# Patient Record
Sex: Male | Born: 1940 | Race: White | Hispanic: No | Marital: Married | Smoking: Former smoker
Health system: Southern US, Community
[De-identification: ages and names within clinical notes are randomized; demographics above are authoritative.]

## PROBLEM LIST (undated history)

## (undated) DIAGNOSIS — I4891 Unspecified atrial fibrillation: Secondary | ICD-10-CM

## (undated) DIAGNOSIS — I2583 Coronary atherosclerosis due to lipid rich plaque: Secondary | ICD-10-CM

## (undated) DIAGNOSIS — I10 Essential (primary) hypertension: Secondary | ICD-10-CM

## (undated) DIAGNOSIS — Z950 Presence of cardiac pacemaker: Secondary | ICD-10-CM

## (undated) DIAGNOSIS — I447 Left bundle-branch block, unspecified: Secondary | ICD-10-CM

## (undated) DIAGNOSIS — I2510 Atherosclerotic heart disease of native coronary artery without angina pectoris: Secondary | ICD-10-CM

## (undated) DIAGNOSIS — D696 Thrombocytopenia, unspecified: Secondary | ICD-10-CM

## (undated) DIAGNOSIS — E785 Hyperlipidemia, unspecified: Secondary | ICD-10-CM

## (undated) HISTORY — DX: Coronary atherosclerosis due to lipid rich plaque: I25.83

## (undated) HISTORY — DX: Left bundle-branch block, unspecified: I44.7

## (undated) HISTORY — DX: Thrombocytopenia, unspecified: D69.6

## (undated) HISTORY — DX: Atherosclerotic heart disease of native coronary artery without angina pectoris: I25.10

## (undated) HISTORY — DX: Essential (primary) hypertension: I10

## (undated) HISTORY — DX: Hyperlipidemia, unspecified: E78.5

## (undated) HISTORY — PX: PACEMAKER GENERATOR CHANGE: SHX5998

## (undated) HISTORY — PX: CARDIAC SURGERY: SHX584

## (undated) HISTORY — PX: CORONARY ARTERY BYPASS GRAFT: SHX141

---

## 2009-04-25 ENCOUNTER — Ambulatory Visit: Payer: Self-pay | Admitting: Internal Medicine

## 2009-09-15 ENCOUNTER — Ambulatory Visit: Payer: Self-pay | Admitting: Internal Medicine

## 2010-03-19 ENCOUNTER — Ambulatory Visit: Payer: Self-pay | Admitting: Internal Medicine

## 2010-03-26 ENCOUNTER — Ambulatory Visit: Payer: Self-pay | Admitting: Internal Medicine

## 2010-07-25 ENCOUNTER — Ambulatory Visit: Payer: Medicare Other | Admitting: Internal Medicine

## 2010-07-25 LAB — BASIC METABOLIC PANEL
BUN: 23 mg/dL — AB (ref 4–21)
Creatinine: 0.9 mg/dL (ref 0.6–1.3)
Glucose: 96 mg/dL

## 2010-07-25 LAB — HEPATIC FUNCTION PANEL: Bilirubin, Total: 0.6 mg/dL

## 2010-12-03 ENCOUNTER — Ambulatory Visit: Payer: Self-pay | Admitting: Internal Medicine

## 2010-12-03 ENCOUNTER — Ambulatory Visit (INDEPENDENT_AMBULATORY_CARE_PROVIDER_SITE_OTHER): Payer: Medicare Other | Admitting: Internal Medicine

## 2010-12-03 ENCOUNTER — Encounter: Payer: Self-pay | Admitting: Internal Medicine

## 2010-12-03 VITALS — BP 138/90 | HR 86 | Temp 98.0°F | Resp 16 | Ht 73.2 in | Wt 211.0 lb

## 2010-12-03 DIAGNOSIS — I2510 Atherosclerotic heart disease of native coronary artery without angina pectoris: Secondary | ICD-10-CM | POA: Insufficient documentation

## 2010-12-03 DIAGNOSIS — E785 Hyperlipidemia, unspecified: Secondary | ICD-10-CM | POA: Insufficient documentation

## 2010-12-03 DIAGNOSIS — I10 Essential (primary) hypertension: Secondary | ICD-10-CM

## 2010-12-03 MED ORDER — FLUOCINOLONE ACETONIDE 0.01 % EX CREA: TOPICAL_CREAM | CUTANEOUS | Status: DC | PRN

## 2010-12-03 NOTE — Patient Instructions (Signed)
Continue current medications. 

## 2010-12-03 NOTE — Progress Notes (Signed)
  Subjective:    Patient ID: Blake Osborne, male    DOB: 07-15-1940, 70 y.o.   MRN: [id number]  Hypertension This is a chronic problem. The current episode started more than 1 month ago. The problem has been gradually improving since onset. The problem is controlled. Pertinent negatives include no anxiety, blurred vision, chest pain, headaches, malaise/fatigue, neck pain, palpitations, peripheral edema, shortness of breath or sweats. There are no associated agents to hypertension. Past treatments include calcium channel blockers. The current treatment provides moderate improvement. There are no compliance problems.  Hypertensive end-organ damage includes CAD/MI.      Review of Systems  Constitutional: Negative for fever, chills, malaise/fatigue, activity change, appetite change, fatigue and unexpected weight change.  HENT: Negative for neck pain.   Eyes: Negative for blurred vision and visual disturbance.  Respiratory: Negative for cough and shortness of breath.   Cardiovascular: Negative for chest pain, palpitations and leg swelling.  Gastrointestinal: Negative for abdominal pain and abdominal distention.  Genitourinary: Negative for dysuria, urgency and difficulty urinating.  Musculoskeletal: Negative for arthralgias and gait problem.  Skin: Negative for color change and rash.  Neurological: Negative for headaches.  Hematological: Negative for adenopathy.  Psychiatric/Behavioral: Negative for sleep disturbance and dysphoric mood. The patient is not nervous/anxious.        Objective:   Physical Exam  Constitutional: He is oriented to person, place, and time. He appears well-developed and well-nourished. No distress.  HENT:  Head: Normocephalic and atraumatic.  Eyes: Conjunctivae and EOM are normal. Pupils are equal, round, and reactive to light.  Neck: Normal range of motion. Neck supple.  Cardiovascular: Normal rate, regular rhythm and normal heart sounds.  Exam reveals no gallop  and no friction rub.   No murmur heard. Pulmonary/Chest: Effort normal and breath sounds normal. No respiratory distress. He has no wheezes. He has no rales. He exhibits no tenderness.  Musculoskeletal: Normal range of motion.  Neurological: He is alert and oriented to person, place, and time. No cranial nerve deficit.  Skin: Skin is warm and dry. He is not diaphoretic.  Psychiatric: He has a normal mood and affect. His behavior is normal. Judgment and thought content normal.            Assessment:  Pt doing very well after starting Amlodipine for BP.  BP<130/80. Will continue current meds. He has follow up with cardiology in December and will forward [place] any labs or changes made at that visit. Discussion: normal blood pressure Cardiovascular risk factors: advanced age (older than 64 for men, 59 for women), dyslipidemia, hypertension and male gender   Plan:  Continue current treatment regimen. Continue current medications. Dietary sodium restriction. Follow up in 6 months. Patient Education: Reviewed risks of hypertension and principles of  treatment.

## 2011-02-04 ENCOUNTER — Telehealth (INDEPENDENT_AMBULATORY_CARE_PROVIDER_SITE_OTHER): Payer: Self-pay

## 2011-02-04 NOTE — Telephone Encounter (Signed)
error 

## 2011-04-05 ENCOUNTER — Telehealth: Payer: Self-pay | Admitting: *Deleted

## 2011-04-05 NOTE — Telephone Encounter (Signed)
Pharm faxed RF request for Flexeril 5 mg 1 tid prn # 30. OK FOR RF?

## 2011-04-06 NOTE — Telephone Encounter (Signed)
Fine to refill 

## 2011-04-08 MED ORDER — CYCLOBENZAPRINE HCL 5 MG PO TABS: 5.0000 mg | ORAL_TABLET | Freq: Three times a day (TID) | ORAL | Status: AC | PRN

## 2011-04-08 NOTE — Telephone Encounter (Signed)
Done

## 2011-04-17 ENCOUNTER — Other Ambulatory Visit: Payer: Self-pay | Admitting: Internal Medicine

## 2011-04-17 MED ORDER — AMLODIPINE BESYLATE 5 MG PO TABS: 5.0000 mg | ORAL_TABLET | Freq: Every day | ORAL | Status: DC

## 2011-04-17 NOTE — Telephone Encounter (Signed)
[phone] Pt called is he out of bp meds norvasc  (generic)  90 supply pt has appointment 06/05/11  [facility]  [phone]

## 2011-06-05 ENCOUNTER — Encounter: Payer: Self-pay | Admitting: Internal Medicine

## 2011-06-05 ENCOUNTER — Ambulatory Visit: Payer: Medicare Other | Admitting: Internal Medicine

## 2011-06-05 ENCOUNTER — Ambulatory Visit (INDEPENDENT_AMBULATORY_CARE_PROVIDER_SITE_OTHER): Payer: Medicare Other | Admitting: Internal Medicine

## 2011-06-05 VITALS — BP 128/78 | HR 91 | Temp 97.7°F | Ht 72.0 in | Wt 221.0 lb

## 2011-06-05 DIAGNOSIS — I10 Essential (primary) hypertension: Secondary | ICD-10-CM

## 2011-06-05 DIAGNOSIS — M1710 Unilateral primary osteoarthritis, unspecified knee: Secondary | ICD-10-CM | POA: Diagnosis not present

## 2011-06-05 DIAGNOSIS — M1711 Unilateral primary osteoarthritis, right knee: Secondary | ICD-10-CM | POA: Insufficient documentation

## 2011-06-05 DIAGNOSIS — E785 Hyperlipidemia, unspecified: Secondary | ICD-10-CM | POA: Diagnosis not present

## 2011-06-05 DIAGNOSIS — M170 Bilateral primary osteoarthritis of knee: Secondary | ICD-10-CM

## 2011-06-05 MED ORDER — HYDROCODONE-ACETAMINOPHEN 5-500 MG PO TABS: 1.0000 | ORAL_TABLET | Freq: Three times a day (TID) | ORAL | Status: AC | PRN

## 2011-06-05 NOTE — Assessment & Plan Note (Signed)
Goal LDL less than 70. Will get records from cardiologist as to recent lipids and LFTs.

## 2011-06-05 NOTE — Assessment & Plan Note (Signed)
Symptoms well controlled with prn Aleve and occasional use of hydrocodone for severe pain at night. Will continue.

## 2011-06-05 NOTE — Patient Instructions (Signed)
Blake [email or web address]  

## 2011-06-05 NOTE — Assessment & Plan Note (Signed)
Blood pressure well-controlled on current medications. Will continue. Will request records as to recent lab work including renal function. Followup in 6 months.

## 2011-06-05 NOTE — Progress Notes (Signed)
Subjective:    Patient ID: Blake Osborne, male    DOB: 1940-12-26, 71 y.o.   MRN: [id number]  HPI 71 year old male with history of CAD, hypertension, and hyperlipidemia presents for followup. He reports he is generally doing well. He reports full compliance with his medications. He notes that he was recently seen by his cardiologist in November 2012 and had lab work including renal function and cholesterol levels which were normal. He denies any recent episodes of chest pain, palpitations, or shortness of breath. He notes that he has put on a few pounds over the holidays and he is working to increase his physical activity to lose this weight.  He does note some intermittent pain in his right knee secondary to osteoarthritis. He takes Aleve for this. On occasion, he uses hydrocodone at night when he has severe pain. This works well for him.  Outpatient Encounter Prescriptions as of 06/05/2011  Medication Sig Dispense Refill  . amLODipine (NORVASC) 5 MG tablet Take 1 tablet (5 mg total) by mouth daily.  90 tablet  0  . aspirin 81 MG tablet Take 81 mg by mouth daily.        . cyclobenzaprine (FLEXERIL) 5 MG tablet Take 5 mg by mouth 3 (three) times daily as needed.      . fluocinolone (VANOS) 0.01 % cream Apply topically as needed. Please disp the Topical Body Oil form 4 oz bottle  30 g  11  . rosuvastatin (CRESTOR) 20 MG tablet Take 20 mg by mouth daily.        Marland Kitchen HYDROcodone-acetaminophen (VICODIN) 5-500 MG per tablet Take 1 tablet by mouth every 8 (eight) hours as needed for pain.  60 tablet  3    Review of Systems  Constitutional: Negative for fever, chills, activity change, appetite change, fatigue and unexpected weight change.  Eyes: Negative for visual disturbance.  Respiratory: Negative for cough and shortness of breath.   Cardiovascular: Negative for chest pain, palpitations and leg swelling.  Gastrointestinal: Negative for abdominal pain and abdominal distention.  Genitourinary:  Negative for dysuria, urgency and difficulty urinating.  Musculoskeletal: Positive for arthralgias. Negative for gait problem.  Skin: Negative for color change and rash.  Hematological: Negative for adenopathy.  Psychiatric/Behavioral: Negative for sleep disturbance and dysphoric mood. The patient is not nervous/anxious.    BP 128/78  Pulse 91  Temp(Src) 97.7 F (36.5 C) (Oral)  Ht 6' (1.829 m)  Wt 221 lb (100.245 kg)  BMI 29.97 kg/m2  SpO2 95%     Objective:   Physical Exam  Constitutional: He is oriented to person, place, and time. He appears well-developed and well-nourished. No distress.  HENT:  Head: Normocephalic and atraumatic.  Right Ear: External ear normal.  Left Ear: External ear normal.  Nose: Nose normal.  Mouth/Throat: Oropharynx is clear and moist. No oropharyngeal exudate.  Eyes: Conjunctivae and EOM are normal. Pupils are equal, round, and reactive to light. Right eye exhibits no discharge. Left eye exhibits no discharge. No scleral icterus.  Neck: Normal range of motion. Neck supple. No tracheal deviation present. No thyromegaly present.  Cardiovascular: Normal rate, regular rhythm and normal heart sounds.  Exam reveals no gallop and no friction rub.   No murmur heard. Pulmonary/Chest: Effort normal and breath sounds normal. No respiratory distress. He has no wheezes. He has no rales. He exhibits no tenderness.  Musculoskeletal: Normal range of motion. He exhibits no edema.       Right knee: He exhibits normal range of motion,  no swelling and no erythema.  Lymphadenopathy:    He has no cervical adenopathy.  Neurological: He is alert and oriented to person, place, and time. No cranial nerve deficit. Coordination normal.  Skin: Skin is warm and dry. No rash noted. He is not diaphoretic. No erythema. No pallor.  Psychiatric: He has a normal mood and affect. His behavior is normal. Judgment and thought content normal.          Assessment & Plan:

## 2011-06-14 ENCOUNTER — Encounter: Payer: Self-pay | Admitting: Internal Medicine

## 2011-07-21 ENCOUNTER — Other Ambulatory Visit: Payer: Self-pay | Admitting: Internal Medicine

## 2011-09-30 DIAGNOSIS — I2510 Atherosclerotic heart disease of native coronary artery without angina pectoris: Secondary | ICD-10-CM | POA: Diagnosis not present

## 2011-09-30 DIAGNOSIS — E785 Hyperlipidemia, unspecified: Secondary | ICD-10-CM | POA: Diagnosis not present

## 2011-09-30 DIAGNOSIS — Z950 Presence of cardiac pacemaker: Secondary | ICD-10-CM | POA: Diagnosis not present

## 2011-10-25 ENCOUNTER — Other Ambulatory Visit: Payer: Self-pay | Admitting: Internal Medicine

## 2011-11-25 ENCOUNTER — Other Ambulatory Visit: Payer: Self-pay | Admitting: *Deleted

## 2011-11-25 MED ORDER — FLUOCINOLONE ACETONIDE 0.01 % EX OIL: 1.0000 "application " | TOPICAL_OIL | CUTANEOUS | Status: DC | PRN

## 2011-12-04 ENCOUNTER — Ambulatory Visit (INDEPENDENT_AMBULATORY_CARE_PROVIDER_SITE_OTHER): Payer: Medicare Other | Admitting: Internal Medicine

## 2011-12-04 ENCOUNTER — Encounter: Payer: Self-pay | Admitting: Internal Medicine

## 2011-12-04 VITALS — BP 134/82 | HR 80 | Temp 97.9°F | Resp 16 | Wt 215.8 lb

## 2011-12-04 DIAGNOSIS — L309 Dermatitis, unspecified: Secondary | ICD-10-CM | POA: Insufficient documentation

## 2011-12-04 DIAGNOSIS — H9209 Otalgia, unspecified ear: Secondary | ICD-10-CM

## 2011-12-04 DIAGNOSIS — M109 Gout, unspecified: Secondary | ICD-10-CM

## 2011-12-04 DIAGNOSIS — I10 Essential (primary) hypertension: Secondary | ICD-10-CM | POA: Diagnosis not present

## 2011-12-04 DIAGNOSIS — L259 Unspecified contact dermatitis, unspecified cause: Secondary | ICD-10-CM | POA: Diagnosis not present

## 2011-12-04 DIAGNOSIS — E039 Hypothyroidism, unspecified: Secondary | ICD-10-CM

## 2011-12-04 DIAGNOSIS — H9202 Otalgia, left ear: Secondary | ICD-10-CM | POA: Insufficient documentation

## 2011-12-04 DIAGNOSIS — M1711 Unilateral primary osteoarthritis, right knee: Secondary | ICD-10-CM

## 2011-12-04 DIAGNOSIS — M1990 Unspecified osteoarthritis, unspecified site: Secondary | ICD-10-CM | POA: Diagnosis not present

## 2011-12-04 DIAGNOSIS — M1710 Unilateral primary osteoarthritis, unspecified knee: Secondary | ICD-10-CM

## 2011-12-04 DIAGNOSIS — E785 Hyperlipidemia, unspecified: Secondary | ICD-10-CM | POA: Diagnosis not present

## 2011-12-04 LAB — CBC WITH DIFFERENTIAL/PLATELET
Basophils Absolute: 0 10*3/uL (ref 0.0–0.1)
Basophils Relative: 0.3 % (ref 0.0–3.0)
HCT: 48.9 % (ref 39.0–52.0)
Hemoglobin: 16.5 g/dL (ref 13.0–17.0)
Lymphocytes Relative: 22.8 % (ref 12.0–46.0)
Lymphs Abs: 1.8 10*3/uL (ref 0.7–4.0)
Monocytes Relative: 9.5 % (ref 3.0–12.0)
Neutro Abs: 4.7 10*3/uL (ref 1.4–7.7)
RBC: 5.15 Mil/uL (ref 4.22–5.81)
RDW: 13.5 % (ref 11.5–14.6)

## 2011-12-04 LAB — COMPREHENSIVE METABOLIC PANEL
ALT: 37 U/L (ref 0–53)
AST: 35 U/L (ref 0–37)
Albumin: 4.3 g/dL (ref 3.5–5.2)
CO2: 27 mEq/L (ref 19–32)
Calcium: 9.4 mg/dL (ref 8.4–10.5)
Chloride: 104 mEq/L (ref 96–112)
Creatinine, Ser: 1 mg/dL (ref 0.4–1.5)
GFR: 74.63 mL/min (ref >60.00)
Potassium: 4.9 mEq/L (ref 3.5–5.1)

## 2011-12-04 LAB — LIPID PANEL
HDL: 61.5 mg/dL (ref >39.00)
Total CHOL/HDL Ratio: 3

## 2011-12-04 LAB — TSH: TSH: 2.98 u[IU]/mL (ref 0.35–5.50)

## 2011-12-04 MED ORDER — AMOXICILLIN-POT CLAVULANATE 875-125 MG PO TABS: 1.0000 | ORAL_TABLET | Freq: Two times a day (BID) | ORAL | Status: AC

## 2011-12-04 MED ORDER — FLUOCINOLONE ACETONIDE 0.01 % EX OIL: 1.0000 "application " | TOPICAL_OIL | CUTANEOUS | Status: DC | PRN

## 2011-12-04 MED ORDER — ANTIPYRINE-BENZOCAINE 5.4-1.4 % OT SOLN: 3.0000 [drp] | OTIC | Status: AC | PRN

## 2011-12-04 MED ORDER — HYDROCODONE-ACETAMINOPHEN 5-500 MG PO TABS: 1.0000 | ORAL_TABLET | Freq: Three times a day (TID) | ORAL | Status: DC | PRN

## 2011-12-04 NOTE — Assessment & Plan Note (Signed)
Left ear pain and exam are most consistent with otitis media. Will treat with Augmentin and topical Auralgan. Patient will call if symptoms are not improving over the next 72 hours.

## 2011-12-04 NOTE — Progress Notes (Signed)
Subjective:    Patient ID: Blake Osborne, male    DOB: 1940-07-21, 71 y.o.   MRN: [id number]  HPI 71 year old male with history of coronary artery disease, hypertension, hyperlipidemia, osteoarthritis presents for followup. He reports he is generally doing well. He reports full compliance with his medications. He has been quite active walking on a regular basis. He follows a healthy diet. He denies any recent chest pain, shortness of breath, or other concerns. He did recently have an episode of gout in his right great toe after several days of consuming wine. He took ibuprofen 200 mg 3 times daily for 2 days with resolution of his symptoms. He has not had repeated attacks of gout.  He is concerned today about left ear pain. He reports this has been ongoing intermittently for the last couple of weeks. He notes some increased nasal congestion. He denies any change in his hearing. He denies any fever or chills. Pain is described as sharp and intermittent.  Outpatient Encounter Prescriptions as of 12/04/2011  Medication Sig Dispense Refill  . amLODipine (NORVASC) 5 MG tablet TAKE ONE TABLET BY MOUTH DAILY  90 tablet  3  . aspirin 325 MG tablet Take 325 mg by mouth daily.      . cetirizine (ZYRTEC) 10 MG tablet Take 10 mg by mouth daily.      . cyclobenzaprine (FLEXERIL) 5 MG tablet Take 5 mg by mouth 3 (three) times daily as needed.      . fluocinolone (FLUOCINOLONE ACETONIDE BODY) 0.01 % external oil Apply 1 application topically as needed.  120 mL  3  . rosuvastatin (CRESTOR) 20 MG tablet Take 20 mg by mouth daily.        Marland Kitchen amoxicillin-clavulanate (AUGMENTIN) 875-125 MG per tablet Take 1 tablet by mouth 2 (two) times daily.  20 tablet  0  . antipyrine-benzocaine (AURALGAN) otic solution Place 3 drops into the left ear every 2 (two) hours as needed for pain.  10 mL  0  . HYDROcodone-acetaminophen (VICODIN) 5-500 MG per tablet Take 1 tablet by mouth every 8 (eight) hours as needed for pain.  90 tablet   3   BP 134/82  Pulse 80  Temp 97.9 F (36.6 C) (Oral)  Resp 16  Wt 215 lb 12 oz (97.864 kg)  SpO2 95%  Review of Systems  Constitutional: Negative for fever, chills, activity change, appetite change, fatigue and unexpected weight change.  HENT: Positive for ear pain and congestion. Negative for hearing loss, sore throat, facial swelling, trouble swallowing, neck pain, sinus pressure, tinnitus and ear discharge.   Eyes: Negative for visual disturbance.  Respiratory: Negative for cough and shortness of breath.   Cardiovascular: Negative for chest pain, palpitations and leg swelling.  Gastrointestinal: Negative for abdominal pain and abdominal distention.  Genitourinary: Negative for dysuria, urgency and difficulty urinating.  Musculoskeletal: Negative for arthralgias and gait problem.  Skin: Negative for color change and rash.  Hematological: Negative for adenopathy.  Psychiatric/Behavioral: Negative for disturbed wake/sleep cycle and dysphoric mood. The patient is not nervous/anxious.        Objective:   Physical Exam  Constitutional: He is oriented to person, place, and time. He appears well-developed and well-nourished. No distress.  HENT:  Head: Normocephalic and atraumatic.  Right Ear: External ear and ear canal normal. Tympanic membrane is not erythematous and not bulging. A middle ear effusion is present.  Left Ear: External ear and ear canal normal. Tympanic membrane is erythematous and retracted. A middle ear effusion  is present.  Nose: Nose normal.  Mouth/Throat: Oropharynx is clear and moist. No oropharyngeal exudate.  Eyes: Conjunctivae and EOM are normal. Pupils are equal, round, and reactive to light. Right eye exhibits no discharge. Left eye exhibits no discharge. No scleral icterus.  Neck: Normal range of motion. Neck supple. No tracheal deviation present. No thyromegaly present.  Cardiovascular: Normal rate, regular rhythm and normal heart sounds.  Exam reveals no  gallop and no friction rub.   No murmur heard. Pulmonary/Chest: Effort normal and breath sounds normal. No respiratory distress. He has no wheezes. He has no rales. He exhibits no tenderness.  Abdominal: Soft. Bowel sounds are normal. He exhibits no distension. There is no tenderness.  Musculoskeletal: Normal range of motion. He exhibits no edema.  Lymphadenopathy:    He has no cervical adenopathy.  Neurological: He is alert and oriented to person, place, and time. No cranial nerve deficit. Coordination normal.  Skin: Skin is warm and dry. Rash (erythematous rash over anterior upper thighs and bilateral arms, c/w atopic dermatitis) noted. He is not diaphoretic. No erythema. No pallor.  Psychiatric: He has a normal mood and affect. His behavior is normal. Judgment and thought content normal.          Assessment & Plan:

## 2011-12-04 NOTE — Patient Instructions (Signed)
Please call if symptoms of left ear pain not improved by next week.

## 2011-12-04 NOTE — Assessment & Plan Note (Signed)
Blood pressure well-controlled. Will continue amlodipine. Followup in 6 months.

## 2011-12-04 NOTE — Assessment & Plan Note (Signed)
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 Symptoms improved with use of fluocinolone as needed. Will continue.

## 2011-12-04 NOTE — Assessment & Plan Note (Signed)
Patient had recent flare of gout. Uric acid level is on upper limit of normal. Patient is aware of triggers for gout including meats and alcohol. If he has recurrent episodes, would favor eventually adding preventative medications such as allopurinol. However, will hold off for now. If he has another attack, would favor using ibuprofen again as this worked well for him, or adding prednisone or colchicine.

## 2011-12-04 NOTE — Assessment & Plan Note (Signed)
Symptoms well controlled with occasional use of hydrocodone at night. Will continue.

## 2011-12-04 NOTE — Assessment & Plan Note (Addendum)
Lipids fairly well-controlled with LDL 87 today, just above goal of 70. Will continue Crestor. Followup 6 months.

## 2011-12-05 ENCOUNTER — Encounter: Payer: Self-pay | Admitting: *Deleted

## 2011-12-05 ENCOUNTER — Telehealth: Payer: Self-pay | Admitting: *Deleted

## 2011-12-05 NOTE — Telephone Encounter (Signed)
Patient was advised of his lab results from 12/04/2011, copy mailed to him at home address.

## 2011-12-09 ENCOUNTER — Telehealth: Payer: Self-pay | Admitting: Internal Medicine

## 2011-12-09 MED ORDER — LEVOFLOXACIN 750 MG PO TABS: 750.0000 mg | ORAL_TABLET | Freq: Every day | ORAL | Status: DC

## 2011-12-09 NOTE — Telephone Encounter (Signed)
Pt called he left his number for triage to call back they have not called him yet He throwing up and dirrhea and wanted to know if he can get another antibotic [facility]

## 2011-12-09 NOTE — Telephone Encounter (Signed)
Patient advised as instructed via telephone, Rx for Levaquin sent to [facility].

## 2011-12-09 NOTE — Telephone Encounter (Signed)
Please have him stop the Augmentin and change to Levaquin 750mg  po daily x 7 days.

## 2011-12-09 NOTE — Telephone Encounter (Signed)
Spoke with patient via telephone, he stated that he has been dizzy and nauseated since starting Augmentin.  His stomach is bloated and he is having stomach pain.  He did vomit today and he is not running a fever.  Please advise.  Uses [facility].

## 2011-12-19 ENCOUNTER — Telehealth: Payer: Self-pay | Admitting: Internal Medicine

## 2011-12-19 MED ORDER — ALLOPURINOL 100 MG PO TABS: 100.0000 mg | ORAL_TABLET | Freq: Every day | ORAL | Status: DC

## 2011-12-19 NOTE — Telephone Encounter (Signed)
Patient advised as instructed via telephone, he would like to come in for labs to check uric acid level in about six weeks.

## 2011-12-19 NOTE — Telephone Encounter (Signed)
Spoke with patient and he would like Dr. Dan Humphreys to call in Allopurinol for his acute gout flares.  He has had two flare ups since his last visit with Dr. Dan Humphreys.  Uses [facility].

## 2011-12-19 NOTE — Telephone Encounter (Signed)
That is fine. I will call in allopurinol but he should not start this during an acute flare. This is a preventative medicine and could exacerbate an acute flare. He should wait until acute flare is resolved and then start medication.

## 2011-12-19 NOTE — Telephone Encounter (Signed)
Pt has ? On his meds Uric acid level  Pt has pain in foot

## 2012-01-09 DIAGNOSIS — Z950 Presence of cardiac pacemaker: Secondary | ICD-10-CM | POA: Diagnosis not present

## 2012-01-13 DIAGNOSIS — Z23 Encounter for immunization: Secondary | ICD-10-CM | POA: Diagnosis not present

## 2012-04-20 DIAGNOSIS — Z950 Presence of cardiac pacemaker: Secondary | ICD-10-CM | POA: Diagnosis not present

## 2012-06-05 ENCOUNTER — Encounter: Payer: Medicare Other | Admitting: Internal Medicine

## 2012-06-15 ENCOUNTER — Ambulatory Visit (INDEPENDENT_AMBULATORY_CARE_PROVIDER_SITE_OTHER): Payer: Medicare Other | Admitting: Internal Medicine

## 2012-06-15 ENCOUNTER — Encounter: Payer: Self-pay | Admitting: Internal Medicine

## 2012-06-15 VITALS — BP 146/80 | HR 78 | Temp 97.8°F | Ht 72.0 in | Wt 221.0 lb

## 2012-06-15 DIAGNOSIS — E785 Hyperlipidemia, unspecified: Secondary | ICD-10-CM

## 2012-06-15 DIAGNOSIS — Z125 Encounter for screening for malignant neoplasm of prostate: Secondary | ICD-10-CM

## 2012-06-15 DIAGNOSIS — I10 Essential (primary) hypertension: Secondary | ICD-10-CM

## 2012-06-15 DIAGNOSIS — M109 Gout, unspecified: Secondary | ICD-10-CM | POA: Diagnosis not present

## 2012-06-15 DIAGNOSIS — Z0000 Encounter for general adult medical examination without abnormal findings: Secondary | ICD-10-CM

## 2012-06-15 LAB — COMPREHENSIVE METABOLIC PANEL
AST: 38 U/L — ABNORMAL HIGH (ref 0–37)
Albumin: 3.6 g/dL (ref 3.5–5.2)
BUN: 19 mg/dL (ref 6–23)
CO2: 27 mEq/L (ref 19–32)
Calcium: 9 mg/dL (ref 8.4–10.5)
Chloride: 106 mEq/L (ref 96–112)
Creatinine, Ser: 1 mg/dL (ref 0.4–1.5)
GFR: 77.97 mL/min (ref >60.00)
Glucose, Bld: 114 mg/dL — ABNORMAL HIGH (ref 70–99)
Potassium: 4.3 mEq/L (ref 3.5–5.1)

## 2012-06-15 LAB — LIPID PANEL
Cholesterol: 182 mg/dL (ref 0–200)
HDL: 53.7 mg/dL (ref >39.00)
Triglycerides: 208 mg/dL — ABNORMAL HIGH (ref 0.0–149.0)

## 2012-06-15 LAB — LDL CHOLESTEROL, DIRECT: Direct LDL: 95.7 mg/dL

## 2012-06-15 LAB — PSA, MEDICARE: PSA: 1.54 ng/ml (ref 0.10–4.00)

## 2012-06-15 MED ORDER — ALLOPURINOL 100 MG PO TABS: 100.0000 mg | ORAL_TABLET | Freq: Every day | ORAL | Status: DC

## 2012-06-15 NOTE — Assessment & Plan Note (Signed)
General medical exam normal today except as noted. Health maintenance UTD except for colonoscopy, which pt declines. Discussed option of virtual colonoscopy and he will look into coverage for this.  Will check basic labs including CBC, CMP, lipids, PSA. Appropriate screening performed. Mild hearing loss noted. Discussed referral to audiology, and he will call if he would like to schedule this. Encouraged continued healthy diet and regular physical activity. Follow up 6 months and prn.

## 2012-06-15 NOTE — Progress Notes (Signed)
Subjective:    Patient ID: Blake Osborne, male    DOB: [birth year], 72 y.o.   MRN: [id number]  HPI The patient is here for annual Medicare wellness examination and management of other chronic and acute problems.   The risk factors are reflected in the social history.  The roster of all physicians providing medical care to patient - is listed in the Snapshot section of the chart.  Activities of daily living:  The patient is 100% independent in all ADLs: dressing, toileting, feeding as well as independent mobility  Home safety : The patient has smoke detectors in the home. They wear seatbelts.  There are no firearms at home. There is no violence in the home.   There is no risks for hepatitis, STDs or HIV. 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 They have no travel history to infectious disease endemic areas of the world.  The patient has seen their dentist in the last six month. [facility]) They have not seen their eye doctor in the last year. Will schedule with [facility]. No issues with hearing. They have deferred audiologic testing in the last year.   They do not  have excessive sun exposure. Discussed the need for sun protection: hats, long sleeves and use of sunscreen if there is significant sun exposure. (Dermatologist - [employer])  Diet: the importance of a healthy diet is discussed. He does have a healthy diet.  The benefits of regular aerobic exercise were discussed. He walks daily with dogs.  Depression screen: there are no signs or vegative symptoms of depression- irritability, change in appetite, anhedonia, sadness/tearfullness.  Cognitive assessment: the patient manages all their financial and personal affairs and is actively engaged. They could relate day,date,year and events.  HCPOA - wife then daughter, Blake Osborne  The following portions of the patient's history were reviewed and updated as appropriate: allergies, current medications, past family  history, past medical history,  past surgical history, past social history  and problem list.  Visual acuity was not assessed per patient preference since he has regular follow up with her ophthalmologist. Hearing and body mass index were assessed and reviewed.   During the course of the visit the patient was educated and counseled about appropriate screening and preventive services including : fall prevention , diabetes screening, nutrition counseling, colorectal cancer screening, and recommended immunizations.     Outpatient Encounter Prescriptions as of 06/15/2012  Medication Sig Dispense Refill  . allopurinol (ZYLOPRIM) 100 MG tablet Take 1 tablet (100 mg total) by mouth daily.  30 tablet  6  . amLODipine (NORVASC) 5 MG tablet TAKE ONE TABLET BY MOUTH DAILY  90 tablet  3  . aspirin 325 MG tablet Take 325 mg by mouth daily.      . cetirizine (ZYRTEC) 10 MG tablet Take 10 mg by mouth daily.      . fluocinolone (FLUOCINOLONE ACETONIDE BODY) 0.01 % external oil Apply 1 application topically as needed.  120 mL  3  . rosuvastatin (CRESTOR) 20 MG tablet Take 20 mg by mouth daily.        . [DISCONTINUED] allopurinol (ZYLOPRIM) 100 MG tablet Take 1 tablet (100 mg total) by mouth daily.  30 tablet  6  . cyclobenzaprine (FLEXERIL) 5 MG tablet Take 5 mg by mouth 3 (three) times daily as needed.      Marland Kitchen HYDROcodone-acetaminophen (VICODIN) 5-500 MG per tablet Take 1 tablet by mouth every 8 (eight) hours as needed for pain.  90 tablet  3   No facility-administered encounter medications on file as of 06/15/2012.   BP 146/80  Pulse 78  Temp(Src) 97.8 F (36.6 C) (Oral)  Ht 6' (1.829 m)  Wt 221 lb (100.245 kg)  BMI 29.97 kg/m2  SpO2 94%  Review of Systems  Constitutional: Negative for fever, chills, activity change, appetite change, fatigue and unexpected weight change.  HENT: Positive for hearing loss.   Eyes: Negative for visual disturbance.  Respiratory: Negative for cough and shortness of breath.    Cardiovascular: Negative for chest pain, palpitations and leg swelling.  Gastrointestinal: Negative for abdominal pain and abdominal distention.  Genitourinary: Negative for dysuria, urgency and difficulty urinating.  Musculoskeletal: Negative for arthralgias and gait problem.  Skin: Negative for color change and rash.  Hematological: Negative for adenopathy.  Psychiatric/Behavioral: Negative for sleep disturbance and dysphoric mood. The patient is not nervous/anxious.        Objective:   Physical Exam  Constitutional: He is oriented to person, place, and time. He appears well-developed and well-nourished. No distress.  HENT:  Head: Normocephalic and atraumatic.  Right Ear: Tympanic membrane, external ear and ear canal normal. Decreased hearing (reported slight hearing loss to whispered voice) is noted.  Left Ear: Tympanic membrane, external ear and ear canal normal. Decreased hearing (reported slight hearing loss to whispered voice) is noted.  Nose: Nose normal.  Mouth/Throat: Oropharynx is clear and moist. No oropharyngeal exudate.  Eyes: Conjunctivae and EOM are normal. Pupils are equal, round, and reactive to light. Right eye exhibits no discharge. Left eye exhibits no discharge. No scleral icterus.  Neck: Normal range of motion. Neck supple. No tracheal deviation present. No thyromegaly present.  Cardiovascular: Normal rate, regular rhythm and normal heart sounds.  Exam reveals no gallop and no friction rub.   No murmur heard. Pulmonary/Chest: Effort normal and breath sounds normal. No accessory muscle usage. Not tachypneic. No respiratory distress. He has no decreased breath sounds. He has no wheezes. He has no rhonchi. He has no rales. He exhibits no tenderness.  Abdominal: Soft. Bowel sounds are normal. He exhibits no distension. There is no tenderness. There is no rebound.  Musculoskeletal: Normal range of motion. He exhibits no edema.  Lymphadenopathy:    He has no cervical  adenopathy.  Neurological: He is alert and oriented to person, place, and time. No cranial nerve deficit. Coordination normal.  Skin: Skin is warm and dry. No rash noted. He is not diaphoretic. No erythema. No pallor.  Psychiatric: He has a normal mood and affect. His behavior is normal. Judgment and thought content normal.          Assessment & Plan:

## 2012-06-15 NOTE — Assessment & Plan Note (Signed)
BP Readings from Last 3 Encounters:  06/15/12 146/80  12/04/11 134/82  10/03/10 170/104   BP generally well controlled (better controlled at home) on current meds. Will check renal function with labs. Follow up 6 months and prn.

## 2012-06-15 NOTE — Assessment & Plan Note (Signed)
Lab Results  Component Value Date   LDLCALC 87 12/04/2011   Will recheck lipids and LFTs with labs today.

## 2012-07-15 DIAGNOSIS — Z950 Presence of cardiac pacemaker: Secondary | ICD-10-CM | POA: Diagnosis not present

## 2012-09-18 DIAGNOSIS — Z0110 Encounter for examination of ears and hearing without abnormal findings: Secondary | ICD-10-CM | POA: Diagnosis not present

## 2012-10-07 ENCOUNTER — Other Ambulatory Visit: Payer: Self-pay | Admitting: *Deleted

## 2012-10-07 MED ORDER — AMLODIPINE BESYLATE 5 MG PO TABS: ORAL_TABLET | ORAL | Status: DC

## 2012-10-07 NOTE — Telephone Encounter (Signed)
Eprescribed.

## 2012-10-12 ENCOUNTER — Ambulatory Visit: Payer: Medicare Other | Admitting: Adult Health

## 2012-10-12 ENCOUNTER — Encounter: Payer: Self-pay | Admitting: Internal Medicine

## 2012-10-12 ENCOUNTER — Ambulatory Visit (INDEPENDENT_AMBULATORY_CARE_PROVIDER_SITE_OTHER): Payer: Medicare Other | Admitting: Internal Medicine

## 2012-10-12 VITALS — BP 132/72 | HR 83 | Temp 98.2°F | Wt 216.0 lb

## 2012-10-12 DIAGNOSIS — I10 Essential (primary) hypertension: Secondary | ICD-10-CM

## 2012-10-12 DIAGNOSIS — M109 Gout, unspecified: Secondary | ICD-10-CM | POA: Diagnosis not present

## 2012-10-12 DIAGNOSIS — L255 Unspecified contact dermatitis due to plants, except food: Secondary | ICD-10-CM

## 2012-10-12 DIAGNOSIS — I2510 Atherosclerotic heart disease of native coronary artery without angina pectoris: Secondary | ICD-10-CM

## 2012-10-12 DIAGNOSIS — E785 Hyperlipidemia, unspecified: Secondary | ICD-10-CM

## 2012-10-12 DIAGNOSIS — L237 Allergic contact dermatitis due to plants, except food: Secondary | ICD-10-CM | POA: Insufficient documentation

## 2012-10-12 MED ORDER — ROSUVASTATIN CALCIUM 20 MG PO TABS: 20.0000 mg | ORAL_TABLET | Freq: Every day | ORAL | Status: DC

## 2012-10-12 MED ORDER — ALLOPURINOL 100 MG PO TABS: 100.0000 mg | ORAL_TABLET | Freq: Every day | ORAL | Status: DC

## 2012-10-12 MED ORDER — PREDNISONE (PAK) 10 MG PO TABS: ORAL_TABLET | ORAL | Status: DC

## 2012-10-12 NOTE — Assessment & Plan Note (Signed)
BP Readings from Last 3 Encounters:  10/12/12 132/72  06/15/12 146/80  12/04/11 134/82   BP generally well controlled on current medications. Will continue. Will check renal function with labs in 11/2012.

## 2012-10-12 NOTE — Assessment & Plan Note (Signed)
Lab Results  Component Value Date   LDLCALC 87 12/04/2011   Lipids have been well controlled on Crestor. Will recheck lipids and LFTs in 11/2012.

## 2012-10-12 NOTE — Progress Notes (Signed)
Subjective:    Patient ID: Blake Osborne, male    DOB: January 23, 1941, 72 y.o.   MRN: [id number]  HPI 72 year old male with history of hypertension, hyperlipidemia presents for followup. He reports that he has been following a healthier diet and getting regular physical activity. He has lost about 5 pounds since his last visit. He is compliant with medications. He does not regularly check blood pressure at home. He denies any chest pain, headache, palpitations.  He is concerned today about rash over his legs and right upper arm. This is consistent with previous exposure to poison ivy. The rash is red and itchy. He has been applying fluocinolone cream with some improvement. The itching is not keeping him awake at night. He denies any fever or chills.  Outpatient Encounter Prescriptions as of 10/12/2012  Medication Sig Dispense Refill  . allopurinol (ZYLOPRIM) 100 MG tablet Take 1 tablet (100 mg total) by mouth daily.  30 tablet  6  . amLODipine (NORVASC) 5 MG tablet TAKE ONE TABLET BY MOUTH DAILY  90 tablet  2  . aspirin 325 MG tablet Take 325 mg by mouth daily.      . cetirizine (ZYRTEC) 10 MG tablet Take 10 mg by mouth daily.      . fluocinolone (FLUOCINOLONE ACETONIDE BODY) 0.01 % external oil Apply 1 application topically as needed.  120 mL  3  . rosuvastatin (CRESTOR) 20 MG tablet Take 1 tablet (20 mg total) by mouth daily.  30 tablet  6  . cyclobenzaprine (FLEXERIL) 5 MG tablet Take 5 mg by mouth 3 (three) times daily as needed.      Marland Kitchen HYDROcodone-acetaminophen (VICODIN) 5-500 MG per tablet Take 1 tablet by mouth every 8 (eight) hours as needed for pain.  90 tablet  3  . predniSONE (STERAPRED UNI-PAK) 10 MG tablet Take 60mg  day 1 then taper by 10mg  daily  21 tablet  0   No facility-administered encounter medications on file as of 10/12/2012.   BP 132/72  Pulse 83  Temp(Src) 98.2 F (36.8 C) (Oral)  Wt 216 lb (97.977 kg)  BMI 29.29 kg/m2  SpO2 95%  Review of Systems  Constitutional:  Negative for fever, chills, activity change, appetite change, fatigue and unexpected weight change.  Eyes: Negative for visual disturbance.  Respiratory: Negative for cough and shortness of breath.   Cardiovascular: Negative for chest pain, palpitations and leg swelling.  Gastrointestinal: Negative for abdominal pain and abdominal distention.  Genitourinary: Negative for dysuria, urgency and difficulty urinating.  Musculoskeletal: Negative for arthralgias and gait problem.  Skin: Positive for color change and rash.  Hematological: Negative for adenopathy.  Psychiatric/Behavioral: Negative for sleep disturbance and dysphoric mood. The patient is not nervous/anxious.        Objective:   Physical Exam  Constitutional: He is oriented to person, place, and time. He appears well-developed and well-nourished. No distress.  HENT:  Head: Normocephalic and atraumatic.  Right Ear: External ear normal.  Left Ear: External ear normal.  Nose: Nose normal.  Mouth/Throat: Oropharynx is clear and moist. No oropharyngeal exudate.  Eyes: Conjunctivae and EOM are normal. Pupils are equal, round, and reactive to light. Right eye exhibits no discharge. Left eye exhibits no discharge. No scleral icterus.  Neck: Normal range of motion. Neck supple. No tracheal deviation present. No thyromegaly present.  Cardiovascular: Normal rate, regular rhythm and normal heart sounds.  Exam reveals no gallop and no friction rub.   No murmur heard. Pulmonary/Chest: Effort normal and breath sounds normal.  No respiratory distress. He has no wheezes. He has no rales. He exhibits no tenderness.  Musculoskeletal: Normal range of motion. He exhibits no edema.  Lymphadenopathy:    He has no cervical adenopathy.  Neurological: He is alert and oriented to person, place, and time. No cranial nerve deficit. Coordination normal.  Skin: Skin is warm and dry. Rash noted. Rash is vesicular (erythematous, vesicular rash over BLE and RUE  c/w contact dermatitis secondary to poison ivy). He is not diaphoretic. There is erythema. No pallor.  Psychiatric: He has a normal mood and affect. His behavior is normal. Judgment and thought content normal.          Assessment & Plan:

## 2012-10-12 NOTE — Assessment & Plan Note (Signed)
No recent flares on Allopurinol. Will continue.

## 2012-10-12 NOTE — Assessment & Plan Note (Signed)
Dermatitis secondary to poison ivy. Will continue topical steroid. If symptoms are worsening, will start prednisone taper pack. Prescription given today. Benadryl prn itching. Followup as needed.

## 2012-10-12 NOTE — Assessment & Plan Note (Signed)
S/p CABG. Currently asymptomatic. Encouraged continued efforts at healthy diet and regular physical activity. Will check lipids with labs in 11/2012.

## 2012-10-19 DIAGNOSIS — Z950 Presence of cardiac pacemaker: Secondary | ICD-10-CM | POA: Diagnosis not present

## 2012-10-31 ENCOUNTER — Other Ambulatory Visit: Payer: Self-pay | Admitting: Internal Medicine

## 2012-11-26 ENCOUNTER — Encounter: Payer: Self-pay | Admitting: *Deleted

## 2012-11-26 ENCOUNTER — Other Ambulatory Visit (INDEPENDENT_AMBULATORY_CARE_PROVIDER_SITE_OTHER): Payer: Medicare Other

## 2012-11-26 DIAGNOSIS — E785 Hyperlipidemia, unspecified: Secondary | ICD-10-CM

## 2012-11-26 LAB — COMPREHENSIVE METABOLIC PANEL
ALT: 39 U/L (ref 0–53)
AST: 41 U/L — ABNORMAL HIGH (ref 0–37)
Albumin: 3.7 g/dL (ref 3.5–5.2)
Alkaline Phosphatase: 47 U/L (ref 39–117)
BUN: 15 mg/dL (ref 6–23)
Chloride: 105 mEq/L (ref 96–112)
Potassium: 4.3 mEq/L (ref 3.5–5.1)
Sodium: 138 mEq/L (ref 135–145)

## 2012-11-26 LAB — LIPID PANEL
LDL Cholesterol: 71 mg/dL (ref 0–99)
Total CHOL/HDL Ratio: 3
Triglycerides: 108 mg/dL (ref 0.0–149.0)

## 2012-12-02 DIAGNOSIS — I4891 Unspecified atrial fibrillation: Secondary | ICD-10-CM | POA: Diagnosis not present

## 2012-12-02 DIAGNOSIS — I2510 Atherosclerotic heart disease of native coronary artery without angina pectoris: Secondary | ICD-10-CM | POA: Diagnosis not present

## 2012-12-02 DIAGNOSIS — Z950 Presence of cardiac pacemaker: Secondary | ICD-10-CM | POA: Diagnosis not present

## 2012-12-02 DIAGNOSIS — E785 Hyperlipidemia, unspecified: Secondary | ICD-10-CM | POA: Diagnosis not present

## 2013-01-19 DIAGNOSIS — Z950 Presence of cardiac pacemaker: Secondary | ICD-10-CM | POA: Diagnosis not present

## 2013-04-27 ENCOUNTER — Ambulatory Visit: Payer: Self-pay | Admitting: Physician Assistant

## 2013-04-27 DIAGNOSIS — E785 Hyperlipidemia, unspecified: Secondary | ICD-10-CM | POA: Diagnosis not present

## 2013-04-27 DIAGNOSIS — Z79899 Other long term (current) drug therapy: Secondary | ICD-10-CM | POA: Diagnosis not present

## 2013-04-27 DIAGNOSIS — H109 Unspecified conjunctivitis: Secondary | ICD-10-CM | POA: Diagnosis not present

## 2013-05-13 DIAGNOSIS — Z950 Presence of cardiac pacemaker: Secondary | ICD-10-CM | POA: Diagnosis not present

## 2013-10-19 ENCOUNTER — Telehealth: Payer: Self-pay | Admitting: Internal Medicine

## 2013-10-19 NOTE — Telephone Encounter (Signed)
Pt needs refills on Crestor 20 mg tab, Allopurinol 100 mg tab and Amlopipine Besylate 5 mg tab. Please advise pt when rx is ready. msn

## 2013-10-20 NOTE — Telephone Encounter (Signed)
Pt has not been seen in over a year, but has an appt scheduled for next mth.  Okay to refill?

## 2013-10-21 NOTE — Telephone Encounter (Signed)
Fine to fill for 6 months. Must keep appointment as well.

## 2013-10-23 ENCOUNTER — Other Ambulatory Visit: Payer: Self-pay | Admitting: Internal Medicine

## 2013-10-28 ENCOUNTER — Other Ambulatory Visit: Payer: Self-pay | Admitting: *Deleted

## 2013-10-28 MED ORDER — AMLODIPINE BESYLATE 5 MG PO TABS: ORAL_TABLET | ORAL | Status: DC

## 2013-10-30 ENCOUNTER — Other Ambulatory Visit: Payer: Self-pay | Admitting: Internal Medicine

## 2013-11-18 ENCOUNTER — Telehealth: Payer: Self-pay | Admitting: *Deleted

## 2013-11-18 NOTE — Telephone Encounter (Signed)
Pt wife states he has poison ivy and would like medication called into the pharmacy, Advised pt that pt would need to be seen before Rx could be sent but I would ask for her anyways

## 2013-11-18 NOTE — Telephone Encounter (Signed)
WE can add at 8:15tomorrow

## 2013-11-19 ENCOUNTER — Encounter: Payer: Self-pay | Admitting: Internal Medicine

## 2013-11-19 ENCOUNTER — Ambulatory Visit (INDEPENDENT_AMBULATORY_CARE_PROVIDER_SITE_OTHER): Payer: Medicare Other | Admitting: Internal Medicine

## 2013-11-19 VITALS — BP 138/84 | HR 83 | Temp 98.0°F | Resp 16 | Ht 72.0 in | Wt 223.2 lb

## 2013-11-19 DIAGNOSIS — E669 Obesity, unspecified: Secondary | ICD-10-CM

## 2013-11-19 DIAGNOSIS — L259 Unspecified contact dermatitis, unspecified cause: Secondary | ICD-10-CM | POA: Diagnosis not present

## 2013-11-19 DIAGNOSIS — L739 Follicular disorder, unspecified: Secondary | ICD-10-CM | POA: Insufficient documentation

## 2013-11-19 DIAGNOSIS — E785 Hyperlipidemia, unspecified: Secondary | ICD-10-CM

## 2013-11-19 DIAGNOSIS — L309 Dermatitis, unspecified: Secondary | ICD-10-CM | POA: Insufficient documentation

## 2013-11-19 HISTORY — DX: Obesity, unspecified: E66.9

## 2013-11-19 MED ORDER — TRIAMCINOLONE ACETONIDE 0.5 % EX OINT: 1.0000 "application " | TOPICAL_OINTMENT | Freq: Two times a day (BID) | CUTANEOUS | Status: DC

## 2013-11-19 MED ORDER — PREDNISONE 10 MG PO TABS: ORAL_TABLET | ORAL | Status: DC

## 2013-11-19 MED ORDER — ALLOPURINOL 100 MG PO TABS: 100.0000 mg | ORAL_TABLET | Freq: Every day | ORAL | Status: DC

## 2013-11-19 MED ORDER — ROSUVASTATIN CALCIUM 20 MG PO TABS: 20.0000 mg | ORAL_TABLET | Freq: Every day | ORAL | Status: DC

## 2013-11-19 NOTE — Progress Notes (Signed)
Subjective:    Patient ID: Blake Osborne, male    DOB: [birth year], 73 y.o.   MRN: [id number]  HPI 72YO male presents for acute visit.  Over last couple of weeks, several exposures to poison oak and poison ivy, resulting in blistering rash on arms and legs. Using topical Hydrocortisone with no improvement.  He would also like to do follow up today. Compliant with medications for cholesterol. Has follow up with cardiology this month. No side effects noted from medication. Notes some recent weight gain. Trying to lose weight and plans to increase exercise next few weeks.  Has never had colonoscopy. Declines referral for this, but will consider Cologuard testing.  Review of Systems  Constitutional: Negative for fever, chills, activity change, appetite change, fatigue and unexpected weight change.  Eyes: Negative for visual disturbance.  Respiratory: Negative for cough and shortness of breath.   Cardiovascular: Negative for chest pain, palpitations and leg swelling.  Gastrointestinal: Negative for abdominal pain and abdominal distention.  Genitourinary: Negative for dysuria, urgency and difficulty urinating.  Musculoskeletal: Negative for arthralgias and gait problem.  Skin: Positive for color change and rash.  Hematological: Negative for adenopathy.  Psychiatric/Behavioral: Negative for sleep disturbance and dysphoric mood. The patient is not nervous/anxious.        Objective:    BP 138/84  Pulse 83  Temp(Src) 98 F (36.7 C) (Oral)  Resp 16  Ht 6' (1.829 m)  Wt 223 lb 4 oz (101.266 kg)  BMI 30.27 kg/m2  SpO2 97% Physical Exam  Constitutional: He is oriented to person, place, and time. He appears well-developed and well-nourished. No distress.  HENT:  Head: Normocephalic and atraumatic.  Right Ear: External ear normal.  Left Ear: External ear normal.  Nose: Nose normal.  Mouth/Throat: Oropharynx is clear and moist. No oropharyngeal exudate.  Eyes: Conjunctivae and EOM  are normal. Pupils are equal, round, and reactive to light. Right eye exhibits no discharge. Left eye exhibits no discharge. No scleral icterus.  Neck: Normal range of motion. Neck supple. No tracheal deviation present. No thyromegaly present.  Cardiovascular: Normal rate, regular rhythm and normal heart sounds.  Exam reveals no gallop and no friction rub.   No murmur heard. Pulmonary/Chest: Effort normal and breath sounds normal. No accessory muscle usage. Not tachypneic. No respiratory distress. He has no decreased breath sounds. He has no wheezes. He has no rhonchi. He has no rales. He exhibits no tenderness.  Musculoskeletal: Normal range of motion. He exhibits no edema.  Lymphadenopathy:    He has no cervical adenopathy.  Neurological: He is alert and oriented to person, place, and time. No cranial nerve deficit. Coordination normal.  Skin: Skin is warm and dry. Rash noted. Rash is pustular (over arms and legs). Vesicular rash: over arms and legs. He is not diaphoretic. There is erythema. No pallor.  Psychiatric: He has a normal mood and affect. His behavior is normal. Judgment and thought content normal.          Assessment & Plan:   Problem List Items Addressed This Visit     Unprioritized   Dermatitis - Primary     Symptoms and exam c/w contact dermatitis from poison ivy. Given extent of rash, will start oral prednisone taper. Use prn topical triamcinolone. Follow up prn.    Relevant Medications      predniSONE (DELTASONE) tablet      triamcinolone (KENALOG) ointment 0.5%   Hyperlipidemia     Will check fasting lipids and LFTs  with labs. Continue Crestor.    Relevant Medications      rosuvastatin (CRESTOR) tablet   Other Relevant Orders      Comprehensive metabolic panel      Lipid panel   Obesity (BMI 30-39.9)      Wt Readings from Last 3 Encounters:  11/19/13 223 lb 4 oz (101.266 kg)  10/12/12 216 lb (97.977 kg)  06/15/12 221 lb (100.245 kg)   Body mass index is  30.27 kg/(m^2). Encouraged healthy diet and exercise with goal of weight loss.        Return in about 6 months (around 05/22/2014) for Physical.

## 2013-11-19 NOTE — Progress Notes (Signed)
Pre-visit discussion using our clinic review tool. No additional management support is needed unless otherwise documented below in the visit note.  

## 2013-11-19 NOTE — Assessment & Plan Note (Signed)
Symptoms and exam c/w contact dermatitis from poison ivy. Given extent of rash, will start oral prednisone taper. Use prn topical triamcinolone. Follow up prn.

## 2013-11-19 NOTE — Patient Instructions (Signed)
Labs next week.  Follow up in March.

## 2013-11-19 NOTE — Assessment & Plan Note (Signed)
Wt Readings from Last 3 Encounters:  11/19/13 223 lb 4 oz (101.266 kg)  10/12/12 216 lb (97.977 kg)  06/15/12 221 lb (100.245 kg)   Body mass index is 30.27 kg/(m^2). Encouraged healthy diet and exercise with goal of weight loss.

## 2013-11-19 NOTE — Assessment & Plan Note (Signed)
Will check fasting lipids and LFTs with labs. Continue Crestor.

## 2013-11-25 ENCOUNTER — Telehealth: Payer: Self-pay | Admitting: Internal Medicine

## 2013-11-25 NOTE — Telephone Encounter (Signed)
Pt called in and has a fasting lab appt due Tuesday morning and was wondering if he would be able to get a diabetes screening that morning as well. And also states he is taking crestor which his heart doctor prescribed to him and states he is having a slight stomach pain. York SpanielSaid he would bring it up with his heart doctor in which has an appt soon.

## 2013-11-30 ENCOUNTER — Ambulatory Visit: Payer: Medicare Other | Admitting: Internal Medicine

## 2013-11-30 ENCOUNTER — Other Ambulatory Visit (INDEPENDENT_AMBULATORY_CARE_PROVIDER_SITE_OTHER): Payer: Medicare Other

## 2013-11-30 DIAGNOSIS — E785 Hyperlipidemia, unspecified: Secondary | ICD-10-CM

## 2013-11-30 LAB — LIPID PANEL
CHOLESTEROL: 180 mg/dL (ref 0–200)
HDL: 65.4 mg/dL (ref >39.00)
LDL Cholesterol: 91 mg/dL (ref 0–99)
NonHDL: 114.6
Total CHOL/HDL Ratio: 3
Triglycerides: 119 mg/dL (ref 0.0–149.0)
VLDL: 23.8 mg/dL (ref 0.0–40.0)

## 2013-11-30 LAB — COMPREHENSIVE METABOLIC PANEL
ALBUMIN: 3.9 g/dL (ref 3.5–5.2)
ALT: 46 U/L (ref 0–53)
AST: 44 U/L — ABNORMAL HIGH (ref 0–37)
Alkaline Phosphatase: 46 U/L (ref 39–117)
BUN: 25 mg/dL — ABNORMAL HIGH (ref 6–23)
CO2: 25 meq/L (ref 19–32)
Calcium: 8.9 mg/dL (ref 8.4–10.5)
Chloride: 105 mEq/L (ref 96–112)
Creatinine, Ser: 1.1 mg/dL (ref 0.4–1.5)
GFR: 69.57 mL/min (ref >60.00)
GLUCOSE: 84 mg/dL (ref 70–99)
POTASSIUM: 3.8 meq/L (ref 3.5–5.1)
Sodium: 140 mEq/L (ref 135–145)
TOTAL PROTEIN: 7 g/dL (ref 6.0–8.3)
Total Bilirubin: 1.2 mg/dL (ref 0.2–1.2)

## 2013-12-01 ENCOUNTER — Encounter: Payer: Self-pay | Admitting: *Deleted

## 2013-12-06 DIAGNOSIS — Z951 Presence of aortocoronary bypass graft: Secondary | ICD-10-CM | POA: Diagnosis not present

## 2013-12-06 DIAGNOSIS — I4430 Unspecified atrioventricular block: Secondary | ICD-10-CM | POA: Diagnosis not present

## 2013-12-06 DIAGNOSIS — Z950 Presence of cardiac pacemaker: Secondary | ICD-10-CM | POA: Diagnosis not present

## 2013-12-06 DIAGNOSIS — E785 Hyperlipidemia, unspecified: Secondary | ICD-10-CM | POA: Diagnosis not present

## 2013-12-06 DIAGNOSIS — I2510 Atherosclerotic heart disease of native coronary artery without angina pectoris: Secondary | ICD-10-CM | POA: Diagnosis not present

## 2014-02-15 DIAGNOSIS — Z23 Encounter for immunization: Secondary | ICD-10-CM | POA: Diagnosis not present

## 2014-02-18 ENCOUNTER — Telehealth: Payer: Self-pay | Admitting: Internal Medicine

## 2014-02-18 ENCOUNTER — Other Ambulatory Visit: Payer: Self-pay | Admitting: Internal Medicine

## 2014-02-18 DIAGNOSIS — L309 Dermatitis, unspecified: Secondary | ICD-10-CM

## 2014-02-18 MED ORDER — PREDNISONE 10 MG PO TABS: ORAL_TABLET | ORAL | Status: DC

## 2014-02-18 NOTE — Telephone Encounter (Signed)
Spoke with pt, advised Rx sent. 

## 2014-02-18 NOTE — Telephone Encounter (Signed)
I called in a refill of the Prednisone taper. He should call back and be seen in clinic Monday if symptoms are not improving.

## 2014-02-18 NOTE — Telephone Encounter (Signed)
Mr. Blake Osborne called saying he was seen fairly recently after doing yard work and Firefightergetting Poison Ivy on his arm. He's done the same thing, yard work and has American Electric PowerPoison Ivy on his arm again. He's wondering if he can be seen or if Dr. Dan HumphreysWalker can prescribe the same thing to him. He said it was very effective last time. Please call the pt. Pt ph# [phone] Thank you.

## 2014-02-25 ENCOUNTER — Telehealth: Payer: Self-pay | Admitting: Internal Medicine

## 2014-02-25 NOTE — Telephone Encounter (Signed)
The patient has been taking prednisone for the last week , he stated that he is not any better and is needing an office visit. I need a work in time.

## 2014-02-25 NOTE — Telephone Encounter (Signed)
7:15am on Tuesday

## 2014-02-25 NOTE — Telephone Encounter (Signed)
Left message for the patient to call back to confirm his appointment on 11.17.15 @ 7:15 am.

## 2014-03-01 ENCOUNTER — Encounter: Payer: Self-pay | Admitting: Internal Medicine

## 2014-03-01 ENCOUNTER — Ambulatory Visit (INDEPENDENT_AMBULATORY_CARE_PROVIDER_SITE_OTHER): Payer: Medicare Other | Admitting: Internal Medicine

## 2014-03-01 VITALS — BP 165/90 | HR 70 | Temp 97.4°F | Ht 72.0 in | Wt 218.8 lb

## 2014-03-01 DIAGNOSIS — E785 Hyperlipidemia, unspecified: Secondary | ICD-10-CM

## 2014-03-01 DIAGNOSIS — I10 Essential (primary) hypertension: Secondary | ICD-10-CM | POA: Diagnosis not present

## 2014-03-01 DIAGNOSIS — L309 Dermatitis, unspecified: Secondary | ICD-10-CM

## 2014-03-01 MED ORDER — ROSUVASTATIN CALCIUM 40 MG PO TABS: 40.0000 mg | ORAL_TABLET | Freq: Every day | ORAL | Status: DC

## 2014-03-01 MED ORDER — CIPROFLOXACIN HCL 500 MG PO TABS: 500.0000 mg | ORAL_TABLET | Freq: Two times a day (BID) | ORAL | Status: DC

## 2014-03-01 MED ORDER — TRIAMCINOLONE ACETONIDE 0.5 % EX OINT: 1.0000 "application " | TOPICAL_OINTMENT | Freq: Two times a day (BID) | CUTANEOUS | Status: DC

## 2014-03-01 NOTE — Assessment & Plan Note (Signed)
Reviewed notes from Cardiology. Crestor was increased to 40mg  daily. Plan repeat lipids at follow up in 05/2013 per pt preference.

## 2014-03-01 NOTE — Assessment & Plan Note (Signed)
BP Readings from Last 3 Encounters:  03/01/14 165/90  11/19/13 138/84  10/12/12 132/72   BP elevated today, however pt coming off prednisone taper. Will monitor. Continue Amlodipine.

## 2014-03-01 NOTE — Progress Notes (Signed)
Subjective:    Patient ID: Blake Osborne, male    DOB: Mar 09, 1941, [age] y.o.   MRN: [id number]  HPI 73YO male presents for acute visit.  Rash - red itchy rash over arms and legs. Biopsied in 2011 and was normal. Using topical Triamcinolone with some improvement. Using same detergent free of dyes. No lotions. Same soaps. Has taken 1 course of prednisone with improvement, but then comes right back.  HL - [facility]  [facility]. Tolerating well.  Review of Systems  Constitutional: Negative for fever, chills, activity change, appetite change, fatigue and unexpected weight change.  Eyes: Negative for visual disturbance.  Respiratory: Negative for cough and shortness of breath.   Cardiovascular: Negative for chest pain, palpitations and leg swelling.  Gastrointestinal: Negative for vomiting, abdominal pain, diarrhea, constipation and abdominal distention.  Genitourinary: Negative for dysuria, urgency and difficulty urinating.  Musculoskeletal: Negative for arthralgias and gait problem.  Skin: Positive for color change and rash.  Hematological: Negative for adenopathy.  Psychiatric/Behavioral: Negative for sleep disturbance and dysphoric mood. The patient is not nervous/anxious.        Objective:    BP 165/90 mmHg  Pulse 70  Temp(Src) 97.4 F (36.3 C) (Oral)  Ht 6' (1.829 m)  Wt 218 lb 12 oz (99.224 kg)  BMI 29.66 kg/m2  SpO2 98% Physical Exam  Constitutional: He is oriented to person, place, and time. He appears well-developed and well-nourished. No distress.  HENT:  Head: Normocephalic and atraumatic.  Right Ear: External ear normal.  Left Ear: External ear normal.  Nose: Nose normal.  Mouth/Throat: Oropharynx is clear and moist. No oropharyngeal exudate.  Eyes: Conjunctivae and EOM are normal. Pupils are equal, round, and reactive to light. Right eye exhibits no discharge. Left eye exhibits no discharge. No scleral icterus.  Neck: Normal  range of motion. Neck supple. No tracheal deviation present. No thyromegaly present.  Cardiovascular: Normal rate, regular rhythm and normal heart sounds.  Exam reveals no gallop and no friction rub.   No murmur heard. Pulmonary/Chest: Effort normal and breath sounds normal. No accessory muscle usage. No tachypnea. No respiratory distress. He has no decreased breath sounds. He has no wheezes. He has no rhonchi. He has no rales. He exhibits no tenderness.  Musculoskeletal: Normal range of motion. He exhibits no edema.  Lymphadenopathy:    He has no cervical adenopathy.  Neurological: He is alert and oriented to person, place, and time. No cranial nerve deficit. Coordination normal.  Skin: Skin is warm and dry. Rash noted. Rash is maculopapular (over forearms, thighs and left axilla). He is not diaphoretic. No erythema. No pallor.  Psychiatric: He has a normal mood and affect. His behavior is normal. Judgment and thought content normal.          Assessment & Plan:   Problem List Items Addressed This Visit      Unprioritized   Dermatitis - Primary    Most consistent with contact dermatitis. Unclear allergen. Will continue Triamcinolone. Discussed referral to dermatology, however pt prefers to hold off for now.    Relevant Medications      triamcinolone ointment (KENALOG) 0.5 %   Hyperlipidemia    Reviewed notes from Cardiology. Crestor was increased to 40mg  daily. Plan repeat lipids at follow up in 05/2013 per pt preference.    Relevant Medications      rosuvastatin (CRESTOR) tablet   Hypertension    BP Readings from Last 3 Encounters:  03/01/14  165/90  11/19/13 138/84  10/12/12 132/72   BP elevated today, however pt coming off prednisone taper. Will monitor. Continue Amlodipine.    Relevant Medications      rosuvastatin (CRESTOR) tablet       Return if symptoms worsen or fail to improve.

## 2014-03-01 NOTE — Progress Notes (Signed)
Pre visit review using our clinic review tool, if applicable. No additional management support is needed unless otherwise documented below in the visit note. 

## 2014-03-01 NOTE — Patient Instructions (Signed)
Continue triamcinolone for rash.  Follow up if no improvement.  Labs to recheck cholesterol in February.

## 2014-03-01 NOTE — Assessment & Plan Note (Signed)
Most consistent with contact dermatitis. Unclear allergen. Will continue Triamcinolone. Discussed referral to dermatology, however pt prefers to hold off for now.

## 2014-03-03 ENCOUNTER — Telehealth: Payer: Self-pay | Admitting: Internal Medicine

## 2014-03-03 NOTE — Telephone Encounter (Signed)
emmi emailed °

## 2014-03-15 DIAGNOSIS — I4430 Unspecified atrioventricular block: Secondary | ICD-10-CM | POA: Diagnosis not present

## 2014-03-28 DIAGNOSIS — I480 Paroxysmal atrial fibrillation: Secondary | ICD-10-CM | POA: Diagnosis not present

## 2014-03-28 DIAGNOSIS — E785 Hyperlipidemia, unspecified: Secondary | ICD-10-CM | POA: Diagnosis not present

## 2014-03-28 DIAGNOSIS — L03011 Cellulitis of right finger: Secondary | ICD-10-CM | POA: Diagnosis not present

## 2014-03-28 DIAGNOSIS — I2510 Atherosclerotic heart disease of native coronary artery without angina pectoris: Secondary | ICD-10-CM | POA: Diagnosis not present

## 2014-05-24 ENCOUNTER — Encounter: Payer: Self-pay | Admitting: Internal Medicine

## 2014-05-24 ENCOUNTER — Ambulatory Visit (INDEPENDENT_AMBULATORY_CARE_PROVIDER_SITE_OTHER): Payer: Medicare Other | Admitting: Internal Medicine

## 2014-05-24 VITALS — BP 157/74 | HR 76 | Temp 97.8°F | Ht 71.5 in | Wt 222.5 lb

## 2014-05-24 DIAGNOSIS — E669 Obesity, unspecified: Secondary | ICD-10-CM | POA: Diagnosis not present

## 2014-05-24 DIAGNOSIS — Z0000 Encounter for general adult medical examination without abnormal findings: Secondary | ICD-10-CM

## 2014-05-24 DIAGNOSIS — I10 Essential (primary) hypertension: Secondary | ICD-10-CM

## 2014-05-24 LAB — HM COLONOSCOPY

## 2014-05-24 MED ORDER — FLUOCINONIDE-E 0.05 % EX CREA: 1.0000 "application " | TOPICAL_CREAM | Freq: Two times a day (BID) | CUTANEOUS | Status: DC

## 2014-05-24 NOTE — Progress Notes (Signed)
Pre visit review using our clinic review tool, if applicable. No additional management support is needed unless otherwise documented below in the visit note. 

## 2014-05-24 NOTE — Assessment & Plan Note (Signed)
BP Readings from Last 3 Encounters:  05/24/14 157/74  03/01/14 165/90  11/19/13 138/84   BP elevated today, however well controlled on recent visit to cardiology. Will continue to monitor. Renal function with labs.

## 2014-05-24 NOTE — Assessment & Plan Note (Signed)
Wt Readings from Last 3 Encounters:  05/24/14 222 lb 8 oz (100.925 kg)  03/01/14 218 lb 12 oz (99.224 kg)  11/19/13 223 lb 4 oz (101.266 kg)   Body mass index is 30.6 kg/(m^2). The patient is asked to make an attempt to improve diet and exercise patterns to aid in medical management of this problem.

## 2014-05-24 NOTE — Progress Notes (Signed)
Subjective:    Patient ID: Blake Osborne, male    DOB: Dec 12, 1940, 74 y.o.   MRN: [id number]  HPI  The patient is here for annual Medicare wellness examination and management of other chronic and acute problems.   The risk factors are reflected in the social history.  The roster of all physicians providing medical care to patient - is listed in the Snapshot section of the chart.  Activities of daily living:  The patient is 100% independent in all ADLs: dressing, toileting, feeding as well as independent mobility. Lives with wife. Dog in home.  Home safety : The patient has smoke detectors in the home. They wear seatbelts.  There are no firearms at home. There is no violence in the home.   There is no risks for hepatitis, STDs or HIV. 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 They have no travel history to infectious disease endemic areas of the world.  The patient has seen their dentist in the last six month. [facility]) They have not seen their eye doctor in the last year.  [facility]. No issues with hearing. They have deferred audiologic testing in the last year.   They do not  have excessive sun exposure. Discussed the need for sun protection: hats, long sleeves and use of sunscreen if there is significant sun exposure. ([facility])  Diet: the importance of a healthy diet is discussed. He does have a healthy diet.  The benefits of regular aerobic exercise were discussed. He walks daily with dogs.  Depression screen: there are no signs or vegative symptoms of depression- irritability, change in appetite, anhedonia, sadness/tearfullness.  Cognitive assessment: the patient manages all their financial and personal affairs and is actively engaged. They could relate day,date,year and events.  HCPOA - wife then daughter, Blake Osborne  The following portions of the patient's history were reviewed and updated as appropriate: allergies, current  medications, past family history, past medical history,  past surgical history, past social history  and problem list.  Visual acuity was not assessed per patient preference since he has regular follow up with her ophthalmologist. Hearing and body mass index were assessed and reviewed.   During the course of the visit the patient was educated and counseled about appropriate screening and preventive services including : fall prevention , diabetes screening, nutrition counseling, colorectal cancer screening, and recommended immunizations.     Past medical, surgical, family and social history per today's encounter.  Review of Systems  Constitutional: Negative for fever, chills, activity change, appetite change, fatigue and unexpected weight change.  Eyes: Negative for visual disturbance.  Respiratory: Negative for cough and shortness of breath.   Cardiovascular: Negative for chest pain, palpitations and leg swelling.  Gastrointestinal: Negative for abdominal pain and abdominal distention.  Genitourinary: Negative for dysuria, urgency and difficulty urinating.  Musculoskeletal: Negative for arthralgias and gait problem.  Skin: Negative for color change and rash.  Hematological: Negative for adenopathy.  Psychiatric/Behavioral: Negative for sleep disturbance and dysphoric mood. The patient is not nervous/anxious.        Objective:    BP 157/74 mmHg  Pulse 76  Temp(Src) 97.8 F (36.6 C) (Oral)  Ht 5' 11.5" (1.816 m)  Wt 222 lb 8 oz (100.925 kg)  BMI 30.60 kg/m2  SpO2 95% Physical Exam  Constitutional: He is oriented to person, place, and time. He appears well-developed and well-nourished. No distress.  HENT:  Head: Normocephalic and atraumatic.  Right Ear: External ear  normal.  Left Ear: External ear normal.  Nose: Nose normal.  Mouth/Throat: Oropharynx is clear and moist. No oropharyngeal exudate.  Eyes: Conjunctivae and EOM are normal. Pupils are equal, round, and reactive to  light. Right eye exhibits no discharge. Left eye exhibits no discharge. No scleral icterus.  Neck: Normal range of motion. Neck supple. No tracheal deviation present. No thyromegaly present.  Cardiovascular: Normal rate, regular rhythm and normal heart sounds.  Exam reveals no gallop and no friction rub.   No murmur heard. Pulmonary/Chest: Effort normal and breath sounds normal. No respiratory distress. He has no wheezes. He has no rales. He exhibits no tenderness.  Abdominal: Soft. Bowel sounds are normal. He exhibits no distension and no mass. There is no tenderness. There is no rebound and no guarding.  Musculoskeletal: Normal range of motion. He exhibits no edema.  Lymphadenopathy:    He has no cervical adenopathy.  Neurological: He is alert and oriented to person, place, and time. No cranial nerve deficit. Coordination normal.  Skin: Skin is warm and dry. Rash noted. Rash is maculopapular (over abdomen, and left hand c/w known contact dermatitis). He is not diaphoretic. No erythema. No pallor.  Psychiatric: He has a normal mood and affect. His behavior is normal. Judgment and thought content normal.          Assessment & Plan:   Problem List Items Addressed This Visit      Unprioritized   Hypertension    BP Readings from Last 3 Encounters:  05/24/14 157/74  03/01/14 165/90  11/19/13 138/84   BP elevated today, however well controlled on recent visit to cardiology. Will continue to monitor. Renal function with labs.      Medicare annual wellness visit, subsequent - Primary    General medical exam normal today except as noted. Declines colonoscopy, however agrees to Cologuard testing, which was ordered. Labs fasting tomorrow CBC, CMP, lipids, PSA. Discussed potential limitations of PSA testing. Encouraged healthy diet and exercise. Immunizations are UTD.      Relevant Orders   CBC with Differential/Platelet   Comprehensive metabolic panel   Lipid panel   PSA, Medicare    Obesity (BMI 30-39.9)    Wt Readings from Last 3 Encounters:  05/24/14 222 lb 8 oz (100.925 kg)  03/01/14 218 lb 12 oz (99.224 kg)  11/19/13 223 lb 4 oz (101.266 kg)   Body mass index is 30.6 kg/(m^2). The patient is asked to make an attempt to improve diet and exercise patterns to aid in medical management of this problem.           Return in about 6 months (around 11/22/2014) for Recheck.

## 2014-05-24 NOTE — Patient Instructions (Signed)

## 2014-05-24 NOTE — Assessment & Plan Note (Signed)
General medical exam normal today except as noted. Declines colonoscopy, however agrees to Cologuard testing, which was ordered. Labs fasting tomorrow CBC, CMP, lipids, PSA. Discussed potential limitations of PSA testing. Encouraged healthy diet and exercise. Immunizations are UTD.

## 2014-05-26 ENCOUNTER — Other Ambulatory Visit: Payer: Medicare Other

## 2014-06-03 ENCOUNTER — Ambulatory Visit: Payer: Self-pay | Admitting: Family Medicine

## 2014-06-03 DIAGNOSIS — Y92009 Unspecified place in unspecified non-institutional (private) residence as the place of occurrence of the external cause: Secondary | ICD-10-CM | POA: Diagnosis not present

## 2014-06-03 DIAGNOSIS — Y999 Unspecified external cause status: Secondary | ICD-10-CM | POA: Diagnosis not present

## 2014-06-03 DIAGNOSIS — S61411A Laceration without foreign body of right hand, initial encounter: Secondary | ICD-10-CM | POA: Diagnosis not present

## 2014-06-03 DIAGNOSIS — Y939 Activity, unspecified: Secondary | ICD-10-CM | POA: Diagnosis not present

## 2014-06-03 DIAGNOSIS — W1809XA Striking against other object with subsequent fall, initial encounter: Secondary | ICD-10-CM | POA: Diagnosis not present

## 2014-06-13 DIAGNOSIS — Z1212 Encounter for screening for malignant neoplasm of rectum: Secondary | ICD-10-CM | POA: Diagnosis not present

## 2014-06-13 DIAGNOSIS — Z1211 Encounter for screening for malignant neoplasm of colon: Secondary | ICD-10-CM | POA: Diagnosis not present

## 2014-06-13 LAB — COLOGUARD: COLOGUARD: NEGATIVE

## 2014-06-16 ENCOUNTER — Ambulatory Visit: Payer: Self-pay | Admitting: Family Medicine

## 2014-06-16 DIAGNOSIS — Z950 Presence of cardiac pacemaker: Secondary | ICD-10-CM | POA: Diagnosis not present

## 2014-06-21 ENCOUNTER — Telehealth: Payer: Self-pay | Admitting: Internal Medicine

## 2014-06-21 NOTE — Telephone Encounter (Signed)
Left message for pt to return my call.

## 2014-06-21 NOTE — Telephone Encounter (Signed)
Cologuard was negative.

## 2014-06-22 NOTE — Telephone Encounter (Signed)
Notified pt. 

## 2014-07-25 ENCOUNTER — Encounter: Payer: Self-pay | Admitting: Internal Medicine

## 2014-08-17 ENCOUNTER — Telehealth: Payer: Self-pay | Admitting: *Deleted

## 2014-08-17 NOTE — Telephone Encounter (Signed)
Fax from pharmacy, needing PA for Crestor. Started online, pending response.  

## 2014-08-18 NOTE — Telephone Encounter (Signed)
[employer] sent message, Crestor approved.

## 2014-09-07 ENCOUNTER — Other Ambulatory Visit: Payer: Self-pay | Admitting: Internal Medicine

## 2014-09-20 DIAGNOSIS — Z950 Presence of cardiac pacemaker: Secondary | ICD-10-CM | POA: Diagnosis not present

## 2014-11-22 ENCOUNTER — Encounter: Payer: Self-pay | Admitting: Internal Medicine

## 2014-11-22 ENCOUNTER — Ambulatory Visit (INDEPENDENT_AMBULATORY_CARE_PROVIDER_SITE_OTHER): Payer: Medicare Other | Admitting: Internal Medicine

## 2014-11-22 VITALS — BP 112/62 | HR 74 | Temp 98.5°F | Wt 229.0 lb

## 2014-11-22 DIAGNOSIS — I10 Essential (primary) hypertension: Secondary | ICD-10-CM

## 2014-11-22 DIAGNOSIS — E785 Hyperlipidemia, unspecified: Secondary | ICD-10-CM | POA: Diagnosis not present

## 2014-11-22 DIAGNOSIS — L309 Dermatitis, unspecified: Secondary | ICD-10-CM | POA: Diagnosis not present

## 2014-11-22 DIAGNOSIS — I2510 Atherosclerotic heart disease of native coronary artery without angina pectoris: Secondary | ICD-10-CM

## 2014-11-22 DIAGNOSIS — E669 Obesity, unspecified: Secondary | ICD-10-CM

## 2014-11-22 MED ORDER — ALLOPURINOL 100 MG PO TABS: 100.0000 mg | ORAL_TABLET | Freq: Every day | ORAL | Status: DC

## 2014-11-22 MED ORDER — FLUOCINONIDE-E 0.05 % EX CREA: 1.0000 "application " | TOPICAL_CREAM | Freq: Two times a day (BID) | CUTANEOUS | Status: DC

## 2014-11-22 NOTE — Assessment & Plan Note (Signed)
Will check lipids with labs. Continue Crestor. 

## 2014-11-22 NOTE — Assessment & Plan Note (Signed)
Symptomatically, doing well. Continue aspirin, statin. Follow up with cardiology as scheduled.

## 2014-11-22 NOTE — Progress Notes (Signed)
Subjective:    Patient ID: Blake Osborne, male    DOB: [birth year], 74 y.o.   MRN: [id number]  HPI  74YO male presents for follow up.  Feeling well. Compliant with medications. Has cardiology follow up in October. No chest pain, dyspnea. Walking daily. Notes some recent dietary indiscretion. Plans to limit calories more to try to lose weight.  Wt Readings from Last 3 Encounters:  11/22/14 229 lb (103.874 kg)  05/24/14 222 lb 8 oz (100.925 kg)  03/01/14 218 lb 12 oz (99.224 kg)     Past medical, surgical, family and social history per today's encounter.   Review of Systems  Constitutional: Negative for fever, chills, activity change, appetite change, fatigue and unexpected weight change.  Eyes: Negative for visual disturbance.  Respiratory: Negative for cough, shortness of breath and wheezing.   Cardiovascular: Negative for chest pain, palpitations and leg swelling.  Gastrointestinal: Negative for nausea, vomiting, abdominal pain, diarrhea, constipation and abdominal distention.  Genitourinary: Negative for dysuria, urgency and difficulty urinating.  Musculoskeletal: Negative for arthralgias and gait problem.  Skin: Negative for color change and rash.  Hematological: Negative for adenopathy.  Psychiatric/Behavioral: Negative for sleep disturbance and dysphoric mood. The patient is not nervous/anxious.        Objective:    BP 112/62 mmHg  Pulse 74  Temp(Src) 98.5 F (36.9 C) (Oral)  Wt 229 lb (103.874 kg)  SpO2 98% Physical Exam  Constitutional: He is oriented to person, place, and time. He appears well-developed and well-nourished. No distress.  HENT:  Head: Normocephalic and atraumatic.  Right Ear: External ear normal.  Left Ear: External ear normal.  Nose: Nose normal.  Mouth/Throat: Oropharynx is clear and moist. No oropharyngeal exudate.  Eyes: Conjunctivae and EOM are normal. Pupils are equal, round, and reactive to light. Right eye exhibits no  discharge. Left eye exhibits no discharge. No scleral icterus.  Neck: Normal range of motion. Neck supple. No tracheal deviation present. No thyromegaly present.  Cardiovascular: Normal rate, regular rhythm and normal heart sounds.  Exam reveals no gallop and no friction rub.   No murmur heard. Pulmonary/Chest: Effort normal and breath sounds normal. No accessory muscle usage. No tachypnea. No respiratory distress. He has no decreased breath sounds. He has no wheezes. He has no rhonchi. He has no rales. He exhibits no tenderness.  Musculoskeletal: Normal range of motion. He exhibits no edema.  Lymphadenopathy:    He has no cervical adenopathy.  Neurological: He is alert and oriented to person, place, and time. No cranial nerve deficit. Coordination normal.  Skin: Skin is warm and dry. No rash noted. He is not diaphoretic. No erythema. No pallor.  Psychiatric: He has a normal mood and affect. His behavior is normal. Judgment and thought content normal.          Assessment & Plan:   Problem List Items Addressed This Visit      Unprioritized   Coronary arteriosclerosis    Symptomatically, doing well. Continue aspirin, statin. Follow up with cardiology as scheduled.      Dermatitis    Continue prn Fluocinonide for dermatitis.      Hyperlipidemia    Will check lipids with labs. Continue Crestor.      Hypertension - Primary    BP Readings from Last 3 Encounters:  11/22/14 112/62  05/24/14 157/74  03/01/14 165/90   BP well controlled. Will check renal function with labs. Continue current medication.      Obesity (BMI 30-39.9)  Wt Readings from Last 3 Encounters:  11/22/14 229 lb (103.874 kg)  05/24/14 222 lb 8 oz (100.925 kg)  03/01/14 218 lb 12 oz (99.224 kg)   Body mass index is 31.5 kg/(m^2). Encouraged healthy diet and exercise.          Return in about 1 year (around 11/22/2015) for Recheck.

## 2014-11-22 NOTE — Patient Instructions (Signed)
Labs in early October.  Follow up here in 1 year.

## 2014-11-22 NOTE — Assessment & Plan Note (Signed)
Wt Readings from Last 3 Encounters:  11/22/14 229 lb (103.874 kg)  05/24/14 222 lb 8 oz (100.925 kg)  03/01/14 218 lb 12 oz (99.224 kg)   Body mass index is 31.5 kg/(m^2). Encouraged healthy diet and exercise.

## 2014-11-22 NOTE — Assessment & Plan Note (Signed)
Continue prn Fluocinonide for dermatitis.

## 2014-11-22 NOTE — Assessment & Plan Note (Signed)
BP Readings from Last 3 Encounters:  11/22/14 112/62  05/24/14 157/74  03/01/14 165/90   BP well controlled. Will check renal function with labs. Continue current medication.

## 2015-01-16 ENCOUNTER — Other Ambulatory Visit (INDEPENDENT_AMBULATORY_CARE_PROVIDER_SITE_OTHER): Payer: Medicare Other

## 2015-01-16 DIAGNOSIS — Z125 Encounter for screening for malignant neoplasm of prostate: Secondary | ICD-10-CM

## 2015-01-16 DIAGNOSIS — Z0000 Encounter for general adult medical examination without abnormal findings: Secondary | ICD-10-CM

## 2015-01-16 DIAGNOSIS — E785 Hyperlipidemia, unspecified: Secondary | ICD-10-CM | POA: Diagnosis not present

## 2015-01-16 LAB — COMPREHENSIVE METABOLIC PANEL
ALBUMIN: 4 g/dL (ref 3.5–5.2)
ALT: 41 U/L (ref 0–53)
AST: 42 U/L — ABNORMAL HIGH (ref 0–37)
Alkaline Phosphatase: 59 U/L (ref 39–117)
BUN: 24 mg/dL — ABNORMAL HIGH (ref 6–23)
CHLORIDE: 105 meq/L (ref 96–112)
CO2: 27 mEq/L (ref 19–32)
CREATININE: 1.11 mg/dL (ref 0.40–1.50)
Calcium: 9.1 mg/dL (ref 8.4–10.5)
GFR: 68.64 mL/min (ref >60.00)
Glucose, Bld: 86 mg/dL (ref 70–99)
POTASSIUM: 4.6 meq/L (ref 3.5–5.1)
Sodium: 141 mEq/L (ref 135–145)
Total Bilirubin: 0.9 mg/dL (ref 0.2–1.2)
Total Protein: 6.6 g/dL (ref 6.0–8.3)

## 2015-01-16 LAB — CBC WITH DIFFERENTIAL/PLATELET
Basophils Absolute: 0 10*3/uL (ref 0.0–0.1)
Basophils Relative: 0.5 % (ref 0.0–3.0)
Eosinophils Absolute: 0.5 10*3/uL (ref 0.0–0.7)
Eosinophils Relative: 6.1 % — ABNORMAL HIGH (ref 0.0–5.0)
HCT: 48 % (ref 39.0–52.0)
Hemoglobin: 15.9 g/dL (ref 13.0–17.0)
Lymphocytes Relative: 25.7 % (ref 12.0–46.0)
Lymphs Abs: 2 10*3/uL (ref 0.7–4.0)
MCHC: 33 g/dL (ref 30.0–36.0)
MCV: 95.5 fl (ref 78.0–100.0)
Monocytes Absolute: 0.7 10*3/uL (ref 0.1–1.0)
Monocytes Relative: 9.4 % (ref 3.0–12.0)
Neutro Abs: 4.5 10*3/uL (ref 1.4–7.7)
Neutrophils Relative %: 58.3 % (ref 43.0–77.0)
Platelets: 122 10*3/uL — ABNORMAL LOW (ref 150.0–400.0)
RBC: 5.02 Mil/uL (ref 4.22–5.81)
RDW: 13.6 % (ref 11.5–15.5)
WBC: 7.7 10*3/uL (ref 4.0–10.5)

## 2015-01-16 LAB — LIPID PANEL
Cholesterol: 131 mg/dL (ref 0–200)
HDL: 54.3 mg/dL (ref >39.00)
LDL Cholesterol: 58 mg/dL (ref 0–99)
NonHDL: 76.87
Total CHOL/HDL Ratio: 2
Triglycerides: 92 mg/dL (ref 0.0–149.0)
VLDL: 18.4 mg/dL (ref 0.0–40.0)

## 2015-01-16 LAB — PSA, MEDICARE: PSA: 2.47 ng/ml (ref 0.10–4.00)

## 2015-01-23 ENCOUNTER — Telehealth: Payer: Self-pay | Admitting: *Deleted

## 2015-01-23 ENCOUNTER — Other Ambulatory Visit: Payer: Self-pay | Admitting: *Deleted

## 2015-01-23 ENCOUNTER — Other Ambulatory Visit (INDEPENDENT_AMBULATORY_CARE_PROVIDER_SITE_OTHER): Payer: Medicare Other

## 2015-01-23 DIAGNOSIS — D696 Thrombocytopenia, unspecified: Secondary | ICD-10-CM | POA: Diagnosis not present

## 2015-01-23 LAB — CBC WITH DIFFERENTIAL/PLATELET
BASOS ABS: 0 10*3/uL (ref 0.0–0.1)
Basophils Relative: 0.4 % (ref 0.0–3.0)
EOS ABS: 0.6 10*3/uL (ref 0.0–0.7)
EOS PCT: 6.7 % — AB (ref 0.0–5.0)
HCT: 48.2 % (ref 39.0–52.0)
Hemoglobin: 16.1 g/dL (ref 13.0–17.0)
LYMPHS ABS: 1.8 10*3/uL (ref 0.7–4.0)
Lymphocytes Relative: 20.6 % (ref 12.0–46.0)
MCHC: 33.5 g/dL (ref 30.0–36.0)
MCV: 95.7 fl (ref 78.0–100.0)
Monocytes Absolute: 0.7 10*3/uL (ref 0.1–1.0)
Monocytes Relative: 7.8 % (ref 3.0–12.0)
NEUTROS PCT: 64.5 % (ref 43.0–77.0)
Neutro Abs: 5.6 10*3/uL (ref 1.4–7.7)
Platelets: 126 10*3/uL — ABNORMAL LOW (ref 150.0–400.0)
RBC: 5.03 Mil/uL (ref 4.22–5.81)
RDW: 13 % (ref 11.5–15.5)
WBC: 8.6 10*3/uL (ref 4.0–10.5)

## 2015-01-23 NOTE — Telephone Encounter (Signed)
Pt cam in for labs and requested reill

## 2015-01-23 NOTE — Telephone Encounter (Signed)
CBC for thrombocytopenia 

## 2015-01-23 NOTE — Telephone Encounter (Signed)
Labs and dx?  

## 2015-01-30 ENCOUNTER — Encounter: Payer: Self-pay | Admitting: Internal Medicine

## 2015-01-30 MED ORDER — ROSUVASTATIN CALCIUM 40 MG PO TABS: 40.0000 mg | ORAL_TABLET | Freq: Every day | ORAL | Status: DC

## 2015-01-31 DIAGNOSIS — I2510 Atherosclerotic heart disease of native coronary artery without angina pectoris: Secondary | ICD-10-CM | POA: Diagnosis not present

## 2015-01-31 DIAGNOSIS — Z950 Presence of cardiac pacemaker: Secondary | ICD-10-CM | POA: Diagnosis not present

## 2015-01-31 DIAGNOSIS — I10 Essential (primary) hypertension: Secondary | ICD-10-CM | POA: Diagnosis not present

## 2015-01-31 DIAGNOSIS — Z951 Presence of aortocoronary bypass graft: Secondary | ICD-10-CM | POA: Diagnosis not present

## 2015-01-31 DIAGNOSIS — E7800 Pure hypercholesterolemia, unspecified: Secondary | ICD-10-CM | POA: Diagnosis not present

## 2015-02-23 ENCOUNTER — Encounter: Payer: Self-pay | Admitting: Internal Medicine

## 2015-03-15 DIAGNOSIS — Z23 Encounter for immunization: Secondary | ICD-10-CM | POA: Diagnosis not present

## 2015-05-09 DIAGNOSIS — Z950 Presence of cardiac pacemaker: Secondary | ICD-10-CM | POA: Diagnosis not present

## 2015-08-09 DIAGNOSIS — I4430 Unspecified atrioventricular block: Secondary | ICD-10-CM | POA: Diagnosis not present

## 2015-10-16 ENCOUNTER — Encounter: Payer: Self-pay | Admitting: Internal Medicine

## 2015-10-27 ENCOUNTER — Encounter: Payer: Self-pay | Admitting: Internal Medicine

## 2015-11-07 ENCOUNTER — Ambulatory Visit (INDEPENDENT_AMBULATORY_CARE_PROVIDER_SITE_OTHER): Payer: Medicare Other | Admitting: Internal Medicine

## 2015-11-07 ENCOUNTER — Encounter: Payer: Self-pay | Admitting: Internal Medicine

## 2015-11-07 VITALS — BP 152/68 | HR 70 | Ht 72.0 in | Wt 224.8 lb

## 2015-11-07 DIAGNOSIS — E785 Hyperlipidemia, unspecified: Secondary | ICD-10-CM | POA: Diagnosis not present

## 2015-11-07 DIAGNOSIS — I10 Essential (primary) hypertension: Secondary | ICD-10-CM | POA: Diagnosis not present

## 2015-11-07 DIAGNOSIS — R001 Bradycardia, unspecified: Secondary | ICD-10-CM | POA: Diagnosis not present

## 2015-11-07 DIAGNOSIS — M109 Gout, unspecified: Secondary | ICD-10-CM | POA: Diagnosis not present

## 2015-11-07 LAB — CBC WITH DIFFERENTIAL/PLATELET
BASOS ABS: 0 10*3/uL (ref 0.0–0.1)
BASOS PCT: 0.4 % (ref 0.0–3.0)
EOS ABS: 0.6 10*3/uL (ref 0.0–0.7)
EOS PCT: 6.7 % — AB (ref 0.0–5.0)
HCT: 46 % (ref 39.0–52.0)
Hemoglobin: 15.4 g/dL (ref 13.0–17.0)
LYMPHS PCT: 22.6 % (ref 12.0–46.0)
Lymphs Abs: 2.1 10*3/uL (ref 0.7–4.0)
MCHC: 33.5 g/dL (ref 30.0–36.0)
MCV: 94.8 fl (ref 78.0–100.0)
MONOS PCT: 10.5 % (ref 3.0–12.0)
Monocytes Absolute: 1 10*3/uL (ref 0.1–1.0)
NEUTROS PCT: 59.8 % (ref 43.0–77.0)
Neutro Abs: 5.6 10*3/uL (ref 1.4–7.7)
PLATELETS: 124 10*3/uL — AB (ref 150.0–400.0)
RBC: 4.86 Mil/uL (ref 4.22–5.81)
RDW: 13.4 % (ref 11.5–15.5)
WBC: 9.4 10*3/uL (ref 4.0–10.5)

## 2015-11-07 LAB — COMPREHENSIVE METABOLIC PANEL
ALT: 39 U/L (ref 0–53)
AST: 36 U/L (ref 0–37)
Albumin: 4 g/dL (ref 3.5–5.2)
Alkaline Phosphatase: 52 U/L (ref 39–117)
BUN: 16 mg/dL (ref 6–23)
CALCIUM: 9.6 mg/dL (ref 8.4–10.5)
CHLORIDE: 103 meq/L (ref 96–112)
CO2: 31 meq/L (ref 19–32)
Creatinine, Ser: 1.05 mg/dL (ref 0.40–1.50)
GFR: 73.02 mL/min (ref >60.00)
Glucose, Bld: 94 mg/dL (ref 70–99)
Potassium: 4.7 mEq/L (ref 3.5–5.1)
Sodium: 138 mEq/L (ref 135–145)
Total Bilirubin: 0.6 mg/dL (ref 0.2–1.2)
Total Protein: 6.8 g/dL (ref 6.0–8.3)

## 2015-11-07 LAB — TSH: TSH: 3.03 u[IU]/mL (ref 0.35–4.50)

## 2015-11-07 LAB — LIPID PANEL
CHOL/HDL RATIO: 3
CHOLESTEROL: 126 mg/dL (ref 0–200)
HDL: 46 mg/dL (ref >39.00)
LDL CALC: 43 mg/dL (ref 0–99)
NonHDL: 80.48
TRIGLYCERIDES: 187 mg/dL — AB (ref 0.0–149.0)
VLDL: 37.4 mg/dL (ref 0.0–40.0)

## 2015-11-07 LAB — TROPONIN I: TNIDX: 0.04 ug/l (ref 0.00–0.06)

## 2015-11-07 MED ORDER — AMLODIPINE BESYLATE 5 MG PO TABS: ORAL_TABLET | ORAL | 3 refills | Status: DC

## 2015-11-07 MED ORDER — ALLOPURINOL 100 MG PO TABS: 100.0000 mg | ORAL_TABLET | Freq: Every day | ORAL | 3 refills | Status: DC

## 2015-11-07 MED ORDER — ROSUVASTATIN CALCIUM 40 MG PO TABS: 40.0000 mg | ORAL_TABLET | Freq: Every day | ORAL | 3 refills | Status: DC

## 2015-11-07 NOTE — Assessment & Plan Note (Signed)
Initially appeared to be bradycardic with HR in 30s by pulse ox, however on exam HR in 70s, confirmed by EKG, paced. Encouraged follow up as scheduled next week with cardiology.

## 2015-11-07 NOTE — Progress Notes (Signed)
Pre visit review using our clinic review tool, if applicable. No additional management support is needed unless otherwise documented below in the visit note. 

## 2015-11-07 NOTE — Assessment & Plan Note (Signed)
BP Readings from Last 3 Encounters:  11/07/15 (!) 152/68  11/22/14 112/62  05/24/14 (!) 157/74   BP generally well controlled. Bradycardic today as noted. Renal function with labs.

## 2015-11-07 NOTE — Patient Instructions (Signed)
Labs today  Follow up as needed

## 2015-11-07 NOTE — Progress Notes (Signed)
Subjective:    Patient ID: Blake Osborne, male    DOB: 12/12/1940, 75 y.o.   MRN: [id number]  HPI  75YO male presents for follow up.  Last week felt short of breath for one day, but no chest pain. No other symptoms of cough, fatigue. Has follow up with cardiology next week for pacemaker check. Aside from this, feeling well.  Wt Readings from Last 3 Encounters:  11/07/15 224 lb 12.8 oz (102 kg)  11/22/14 229 lb (103.9 kg)  05/24/14 222 lb 8 oz (100.9 kg)   BP Readings from Last 3 Encounters:  11/07/15 (!) 152/68  11/22/14 112/62  05/24/14 (!) 157/74    Past Medical History:  Diagnosis Date  . 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 Hyperlipidemia   . Hypertension   . Left BB block NEC    Family History  Problem Relation Age of Onset  . Heart disease Father    Past Surgical History:  Procedure Laterality Date  . CORONARY ARTERY BYPASS GRAFT     Social History   Social History  . Marital status: Married    Spouse name: N/A  . Number of children: N/A  . Years of education: N/A   Social History Main Topics  . Smoking status: Never Smoker  . Smokeless tobacco: Never Used  . Alcohol use 0.0 oz/week  . Drug use: No  . Sexual activity: Not Asked   Other Topics Concern  . None   Social History Narrative  . None    Review of Systems  Constitutional: Negative for activity change, appetite change, chills, fatigue, fever and unexpected weight change.  Eyes: Negative for visual disturbance.  Respiratory: Positive for shortness of breath. Negative for cough, chest tightness and wheezing.   Cardiovascular: Negative for chest pain, palpitations and leg swelling.  Gastrointestinal: Negative for abdominal distention and abdominal pain.  Genitourinary: Negative for difficulty urinating, dysuria and urgency.  Musculoskeletal: Negative for arthralgias and gait problem.  Skin: Negative for color change  and rash.  Hematological: Negative for adenopathy.  Psychiatric/Behavioral: Negative for dysphoric mood and sleep disturbance. The patient is not nervous/anxious.        Objective:    BP (!) 152/68 (BP Location: Left Arm, Patient Position: Sitting, Cuff Size: Large)   Pulse 70   Ht 6' (1.829 m)   Wt 224 lb 12.8 oz (102 kg)   SpO2 95%   BMI 30.49 kg/m  Physical Exam  Constitutional: He is oriented to person, place, and time. He appears well-developed and well-nourished. No distress.  HENT:  Head: Normocephalic and atraumatic.  Right Ear: External ear normal.  Left Ear: External ear normal.  Nose: Nose normal.  Mouth/Throat: Oropharynx is clear and moist. No oropharyngeal exudate.  Eyes: Conjunctivae and EOM are normal. Pupils are equal, round, and reactive to light. Right eye exhibits no discharge. Left eye exhibits no discharge. No scleral icterus.  Neck: Normal range of motion. Neck supple. No tracheal deviation present. No thyromegaly present.  Cardiovascular: Normal rate, regular rhythm and normal heart sounds.  Exam reveals no gallop and no friction rub.   No murmur heard. Pulmonary/Chest: Effort normal and breath sounds normal. No accessory muscle usage. No tachypnea. No respiratory distress. He has no decreased breath sounds. He has no wheezes. He has no rhonchi. He has no rales. He exhibits no tenderness.  Musculoskeletal: Normal range of motion. He exhibits no edema.  Lymphadenopathy:  He has no cervical adenopathy.  Neurological: He is alert and oriented to person, place, and time. No cranial nerve deficit. Coordination normal.  Skin: Skin is warm and dry. No rash noted. He is not diaphoretic. No erythema. No pallor.  Psychiatric: He has a normal mood and affect. His behavior is normal. Judgment and thought content normal.          Assessment & Plan:   Problem List Items Addressed This Visit      Unprioritized   Bradycardia    Initially appeared to be  bradycardic with HR in 30s by pulse ox, however on exam HR in 70s, confirmed by EKG, paced. Encouraged follow up as scheduled next week with cardiology.      Relevant Orders   EKG 12-Lead (Completed)   TSH   CBC with Differential/Platelet   Troponin I   Gout   Hyperlipidemia (Chronic)   Relevant Medications   amLODipine (NORVASC) 5 MG tablet   rosuvastatin (CRESTOR) 40 MG tablet   Other Relevant Orders   Lipid panel   Hypertension - Primary (Chronic)    BP Readings from Last 3 Encounters:  11/07/15 (!) 152/68  11/22/14 112/62  05/24/14 (!) 157/74   BP generally well controlled. Bradycardic today as noted. Renal function with labs.      Relevant Medications   amLODipine (NORVASC) 5 MG tablet   rosuvastatin (CRESTOR) 40 MG tablet   Other Relevant Orders   Comprehensive metabolic panel    Other Visit Diagnoses   None.      Return if symptoms worsen or fail to improve.  Ronna Polio, MD Internal Medicine [facility]

## 2015-11-09 DIAGNOSIS — I4430 Unspecified atrioventricular block: Secondary | ICD-10-CM | POA: Diagnosis not present

## 2015-11-10 ENCOUNTER — Encounter: Payer: Self-pay | Admitting: Internal Medicine

## 2015-11-10 ENCOUNTER — Other Ambulatory Visit: Payer: Self-pay | Admitting: Internal Medicine

## 2015-11-10 MED ORDER — FLUOCINONIDE-E 0.05 % EX CREA: 1.0000 "application " | TOPICAL_CREAM | Freq: Two times a day (BID) | CUTANEOUS | 2 refills | Status: DC

## 2015-11-10 NOTE — Telephone Encounter (Signed)
Last refilled 11/22/14. Last seen 11/07/15. Please advise?

## 2015-11-23 ENCOUNTER — Encounter: Payer: Medicare Other | Admitting: Internal Medicine

## 2015-12-19 ENCOUNTER — Encounter: Payer: Self-pay | Admitting: Family Medicine

## 2015-12-19 ENCOUNTER — Ambulatory Visit (INDEPENDENT_AMBULATORY_CARE_PROVIDER_SITE_OTHER): Payer: Medicare Other | Admitting: Family

## 2015-12-19 ENCOUNTER — Ambulatory Visit: Payer: Medicare Other

## 2015-12-19 ENCOUNTER — Encounter: Payer: Self-pay | Admitting: Family

## 2015-12-19 VITALS — BP 138/70 | HR 75 | Temp 97.6°F | Wt 220.0 lb

## 2015-12-19 DIAGNOSIS — R05 Cough: Secondary | ICD-10-CM

## 2015-12-19 DIAGNOSIS — R059 Cough, unspecified: Secondary | ICD-10-CM

## 2015-12-19 LAB — POCT INFLUENZA A/B

## 2015-12-19 LAB — POCT RAPID STREP A (OFFICE): Rapid Strep A Screen: NEGATIVE

## 2015-12-19 NOTE — Progress Notes (Signed)
Pre visit review using our clinic review tool, if applicable. No additional management support is needed unless otherwise documented below in the visit note. 

## 2015-12-19 NOTE — Progress Notes (Signed)
Subjective:    Patient ID: Blake CordJoseph Terrence Settle, male    DOB: 01-Jun-1940, 75 y.o.   MRN: [id number]  CC: Blake Osborne is a 75 y.o. male who presents today for an acute visit.    HPI: Patient here with chief complaint of cough, nasal congestion for the past 3 days. He's been on a recent trip to [place]. He is taken aspirin with mild relief. Fever 100.5 yesterday. Endorses chills, sore throat. No severe HA, neck stiffness.     HISTORY:  Past Medical History:  Diagnosis Date  . Coronary arteriosclerosis due to lipid rich plaque    [facility]  . Hyperlipidemia   . Hypertension   . Left BB block NEC    Past Surgical History:  Procedure Laterality Date  . CORONARY ARTERY BYPASS GRAFT     Family History  Problem Relation Age of Onset  . Heart disease Father     Allergies: Augmentin [amoxicillin-pot clavulanate]; Dust mite extract; and Pollen extract Current Outpatient Prescriptions on File Prior to Visit  Medication Sig Dispense Refill  . allopurinol (ZYLOPRIM) 100 MG tablet Take 1 tablet (100 mg total) by mouth daily. 90 tablet 3  . amLODipine (NORVASC) 5 MG tablet TAKE ONE TABLET BY MOUTH DAILY 90 tablet 3  . aspirin 325 MG tablet Take 325 mg by mouth daily.    . cetirizine (ZYRTEC) 10 MG tablet Take 10 mg by mouth daily as needed.     . fluocinonide-emollient (LIDEX-E) 0.05 % cream Apply 1 application topically 2 (two) times daily. 60 g 2  . rosuvastatin (CRESTOR) 40 MG tablet Take 1 tablet (40 mg total) by mouth daily. 90 tablet 3   No current facility-administered medications on file prior to visit.     Social History  Substance Use Topics  . Smoking status: Never Smoker  . Smokeless tobacco: Never Used  . Alcohol use 0.0 oz/week    Review of Systems  Constitutional: Positive for chills and fever.  HENT: Positive for congestion and sore throat. Negative for ear pain and sinus pressure.   Respiratory: Positive for cough.  Negative for shortness of breath and wheezing.   Cardiovascular: Negative for chest pain and palpitations.  Gastrointestinal: Negative for nausea and vomiting.  Musculoskeletal: Negative for myalgias, neck pain and neck stiffness.      Objective:    BP 138/70   Pulse 75   Temp 97.6 F (36.4 C) (Oral)   Wt 220 lb (99.8 kg)   SpO2 97%   BMI 29.84 kg/m    Physical Exam  Constitutional: Vital signs are normal. He appears well-developed and well-nourished.  HENT:  Head: Normocephalic and atraumatic.  Right Ear: Hearing, tympanic membrane, external ear and ear canal normal. No drainage, swelling or tenderness. Tympanic membrane is not injected, not erythematous and not bulging. No middle ear effusion. No decreased hearing is noted.  Left Ear: Hearing, tympanic membrane, external ear and ear canal normal. No drainage, swelling or tenderness. Tympanic membrane is not injected, not erythematous and not bulging.  No middle ear effusion. No decreased hearing is noted.  Nose: Nose normal. Right sinus exhibits no maxillary sinus tenderness and no frontal sinus tenderness. Left sinus exhibits no maxillary sinus tenderness and no frontal sinus tenderness.  Mouth/Throat: Uvula is midline and mucous membranes are normal. Posterior oropharyngeal erythema present. No oropharyngeal exudate, posterior oropharyngeal edema or tonsillar abscesses.  Eyes: Conjunctivae are normal.  Cardiovascular: Regular rhythm and normal heart sounds.  Pulmonary/Chest: Effort normal and breath sounds normal. No respiratory distress. He has no wheezes. He has no rhonchi. He has no rales.  Lymphadenopathy:       Head (right side): No submental, no submandibular, no tonsillar, no preauricular, no posterior auricular and no occipital adenopathy present.       Head (left side): No submental, no submandibular, no tonsillar, no preauricular, no posterior auricular and no occipital adenopathy present.    He has no cervical  adenopathy.  Neurological: He is alert.  Skin: Skin is warm and dry.  Psychiatric: He has a normal mood and affect. His speech is normal and behavior is normal.  Vitals reviewed.      Assessment & Plan:  1. Cough Negative influenza and strep. Offered patient chest x-ray, he politely declined. We jointly decided to delay antibiotics at this time as patient is feeling better today. Patient will remain vigilant and will let me know if symptoms do not continue to improve.   - Canceled CXR - POCT Influenza A/B - POCT Rapid Strep A      I am having Blake Osborne maintain his aspirin, cetirizine, amLODipine, allopurinol, rosuvastatin, and fluocinonide-emollient.   No orders of the defined types were placed in this encounter.   Return precautions given.   Risks, benefits, and alternatives of the medications and treatment plan prescribed today were discussed, and patient expressed understanding.   Education regarding symptom management and diagnosis given to patient on AVS.  Continue to follow with Wynona Dove, MD for routine health maintenance.   Blake Cord and I agreed with plan.   Rennie Plowman, FNP

## 2015-12-19 NOTE — Patient Instructions (Signed)

## 2015-12-21 ENCOUNTER — Telehealth: Payer: Self-pay | Admitting: Family

## 2015-12-21 ENCOUNTER — Encounter: Payer: Self-pay | Admitting: Family Medicine

## 2015-12-21 DIAGNOSIS — R059 Cough, unspecified: Secondary | ICD-10-CM

## 2015-12-21 DIAGNOSIS — R05 Cough: Secondary | ICD-10-CM

## 2015-12-21 MED ORDER — AZITHROMYCIN 250 MG PO TABS: ORAL_TABLET | ORAL | 0 refills | Status: DC

## 2015-12-21 NOTE — Telephone Encounter (Signed)
Please call patient and let him know that I have sent and azithromycin for his cough. Please ensure to remind him to give us a call if he is not any better and certainly any worsening symptoms over the weekend he would need to go to the ED.

## 2015-12-21 NOTE — Telephone Encounter (Signed)
Patient has been informed.

## 2016-01-19 DIAGNOSIS — Z951 Presence of aortocoronary bypass graft: Secondary | ICD-10-CM | POA: Diagnosis not present

## 2016-01-19 DIAGNOSIS — I4811 Longstanding persistent atrial fibrillation: Secondary | ICD-10-CM | POA: Insufficient documentation

## 2016-01-19 DIAGNOSIS — I480 Paroxysmal atrial fibrillation: Secondary | ICD-10-CM | POA: Diagnosis not present

## 2016-01-19 DIAGNOSIS — I10 Essential (primary) hypertension: Secondary | ICD-10-CM | POA: Diagnosis not present

## 2016-01-19 DIAGNOSIS — I4430 Unspecified atrioventricular block: Secondary | ICD-10-CM | POA: Diagnosis not present

## 2016-01-19 DIAGNOSIS — I2510 Atherosclerotic heart disease of native coronary artery without angina pectoris: Secondary | ICD-10-CM | POA: Diagnosis not present

## 2016-01-22 DIAGNOSIS — I480 Paroxysmal atrial fibrillation: Secondary | ICD-10-CM | POA: Diagnosis not present

## 2016-02-12 ENCOUNTER — Encounter: Payer: Self-pay | Admitting: Family Medicine

## 2016-02-12 ENCOUNTER — Ambulatory Visit (INDEPENDENT_AMBULATORY_CARE_PROVIDER_SITE_OTHER): Payer: Medicare Other | Admitting: Family Medicine

## 2016-02-12 DIAGNOSIS — L309 Dermatitis, unspecified: Secondary | ICD-10-CM | POA: Diagnosis not present

## 2016-02-12 DIAGNOSIS — E785 Hyperlipidemia, unspecified: Secondary | ICD-10-CM

## 2016-02-12 DIAGNOSIS — I10 Essential (primary) hypertension: Secondary | ICD-10-CM

## 2016-02-12 DIAGNOSIS — I480 Paroxysmal atrial fibrillation: Secondary | ICD-10-CM | POA: Diagnosis not present

## 2016-02-12 MED ORDER — FLUOCINONIDE-E 0.05 % EX CREA: 1.0000 "application " | TOPICAL_CREAM | Freq: Two times a day (BID) | CUTANEOUS | 2 refills | Status: DC

## 2016-02-12 NOTE — Assessment & Plan Note (Signed)
Asymptomatic. Recent cholesterol well-controlled. Continue Crestor.

## 2016-02-12 NOTE — Patient Instructions (Signed)
Nice to see you. Please continue to work on diet and exercise. Please continue to monitor your blood pressure and take your blood pressure medications. If you develop any issues with excessive bleeding or inability to get yourself to stop bleeding on Xarelto please seek medical attention immediately.

## 2016-02-12 NOTE — Assessment & Plan Note (Signed)
Asymptomatic. Rate controlled. Sounds as though he is in sinus rhythm at this time. Recently started on Xarelto. He will continue this through cardiology. I discussed bleeding precautions with him.

## 2016-02-12 NOTE — Assessment & Plan Note (Signed)
Continue as needed Lidex.

## 2016-02-12 NOTE — Progress Notes (Signed)
Pre visit review using our clinic review tool, if applicable. No additional management support is needed unless otherwise documented below in the visit note. 

## 2016-02-12 NOTE — Progress Notes (Signed)
  Blake Osborne, Blake Osborne Phone: [phone]  Tennis MustJoseph Terrence Fransico Osborne is a 75 y.o. male who presents today for follow-up.  Atrial fibrillation: Diagnosed with paroxysmal A. fib through cardiology. Notes no significant palpitations. No bleeding. Has been placed on Xarelto and is going to follow-up with cardiology.  HYPERTENSION  Disease Monitoring  Home BP Monitoring 120-140/70-80 Chest pain- no    Dyspnea- no Medications  Compliance-  taking amlodipine.  Edema- no  HYPERLIPIDEMIA Symptoms Chest pain on exertion:  No   Leg claudication:   No Medications: Compliance- taking Crestor Right upper quadrant pain- no  Muscle aches- no  Dermatitis: Patient notes he occasionally gets dermatitis on his fingers. Notes there are small patches of red bumps. Was placed on Lidex previously for this and it is beneficial. Notes the rash gets worse if he doesn't use it. Typically only occurs during the winter.   PMH: nonsmoker.   ROS see history of present illness  Objective  Physical Exam Vitals:   02/12/16 1334  BP: 138/78  Pulse: 81  Temp: 98.6 F (37 C)    BP Readings from Last 3 Encounters:  02/12/16 138/78  12/19/15 138/70  11/07/15 (!) 152/68   Wt Readings from Last 3 Encounters:  02/12/16 219 lb 3.2 oz (99.4 kg)  12/19/15 220 lb (99.8 kg)  11/07/15 224 lb 12.8 oz (102 kg)    Physical Exam  Constitutional: He is well-developed, well-nourished, and in no distress.  Cardiovascular: Normal rate, regular rhythm and normal heart sounds.   Pulmonary/Chest: Effort normal and breath sounds normal.  Neurological: He is alert. Gait normal.  Skin: Skin is warm and dry.  No rash noted on his hands     Assessment/Plan: Please see individual problem list.  Hypertension At goal. Well-controlled at home. Continue amlodipine.  Dermatitis Continue as needed Lidex.  Hyperlipidemia Asymptomatic. Recent cholesterol well-controlled. Continue Crestor.  Paroxysmal atrial fibrillation  (HCC) Asymptomatic. Rate controlled. Sounds as though he is in sinus rhythm at this time. Recently started on Xarelto. He will continue this through cardiology. I discussed bleeding precautions with him.   Blake Osborne, Blake Osborne [facility]

## 2016-02-12 NOTE — Assessment & Plan Note (Signed)
At goal. Well-controlled at home. Continue amlodipine.

## 2016-03-12 ENCOUNTER — Telehealth: Payer: Self-pay | Admitting: Family Medicine

## 2016-03-12 NOTE — Telephone Encounter (Signed)
Pt will call back to sch AWV. Thank you! °

## 2016-04-06 ENCOUNTER — Ambulatory Visit
Admission: EM | Admit: 2016-04-06 | Discharge: 2016-04-06 | Disposition: A | Discharge status: Other Acute Inpatient Hospital | Payer: Medicare Other | Attending: Family Medicine | Admitting: Family Medicine

## 2016-04-06 ENCOUNTER — Encounter: Payer: Self-pay | Admitting: Emergency Medicine

## 2016-04-06 DIAGNOSIS — R1013 Epigastric pain: Secondary | ICD-10-CM | POA: Diagnosis not present

## 2016-04-06 DIAGNOSIS — I2510 Atherosclerotic heart disease of native coronary artery without angina pectoris: Secondary | ICD-10-CM | POA: Diagnosis not present

## 2016-04-06 DIAGNOSIS — R079 Chest pain, unspecified: Secondary | ICD-10-CM | POA: Diagnosis not present

## 2016-04-06 DIAGNOSIS — I441 Atrioventricular block, second degree: Secondary | ICD-10-CM

## 2016-04-06 DIAGNOSIS — N2889 Other specified disorders of kidney and ureter: Secondary | ICD-10-CM | POA: Diagnosis not present

## 2016-04-06 DIAGNOSIS — D72829 Elevated white blood cell count, unspecified: Secondary | ICD-10-CM | POA: Diagnosis not present

## 2016-04-06 DIAGNOSIS — I493 Ventricular premature depolarization: Secondary | ICD-10-CM | POA: Diagnosis not present

## 2016-04-06 DIAGNOSIS — R0789 Other chest pain: Secondary | ICD-10-CM | POA: Diagnosis not present

## 2016-04-06 DIAGNOSIS — I447 Left bundle-branch block, unspecified: Secondary | ICD-10-CM | POA: Diagnosis not present

## 2016-04-06 DIAGNOSIS — Z951 Presence of aortocoronary bypass graft: Secondary | ICD-10-CM

## 2016-04-06 DIAGNOSIS — Z9861 Coronary angioplasty status: Secondary | ICD-10-CM

## 2016-04-06 DIAGNOSIS — K5790 Diverticulosis of intestine, part unspecified, without perforation or abscess without bleeding: Secondary | ICD-10-CM | POA: Diagnosis not present

## 2016-04-06 DIAGNOSIS — R9431 Abnormal electrocardiogram [ECG] [EKG]: Secondary | ICD-10-CM | POA: Insufficient documentation

## 2016-04-06 DIAGNOSIS — Z950 Presence of cardiac pacemaker: Secondary | ICD-10-CM

## 2016-04-06 DIAGNOSIS — K8020 Calculus of gallbladder without cholecystitis without obstruction: Secondary | ICD-10-CM | POA: Diagnosis not present

## 2016-04-06 HISTORY — DX: Presence of cardiac pacemaker: Z95.0

## 2016-04-06 HISTORY — DX: Unspecified atrial fibrillation: I48.91

## 2016-04-06 NOTE — ED Notes (Signed)
EMS called to transport patient to ED 

## 2016-04-06 NOTE — ED Triage Notes (Signed)
Patient c/o chest and epigastric pain that started early this morning.  Patient denies SOB.  Patient reports some nausea.

## 2016-04-06 NOTE — ED Provider Notes (Signed)
[facility]    CSN: [id number] Arrival date & time: 04/06/16  1033     History   Chief Complaint Chief Complaint  Patient presents with  . Chest Pain  . Abdominal Pain    HPI Blake Osborne is a 75 y.o. male.   75 yo male with h/o CAD, s/p CABG x2 (2011), h/o type 2 AV block, paroxysmal atrial fibrillation, s/p pacemaker presents with a c/o epigastric pain (7/10 pain) since 2am. Pain is "dull", "achy" and has been constant. Denies any radiation, shortness of breath, vomiting, shortness of breath.    The history is provided by the patient.    Past Medical History:  Diagnosis Date  . Atrial fibrillation (HCC)   . Coronary arteriosclerosis due to lipid rich plaque    [facility]  . Hyperlipidemia   . Hypertension   . Left BB block NEC   . Pacemaker     Patient Active Problem List   Diagnosis Date Noted  . Paroxysmal atrial fibrillation (HCC) 02/12/2016  . Bradycardia 11/07/2015  . Dermatitis 11/19/2013  . Obesity (BMI 30-39.9) 11/19/2013  . Medicare annual wellness visit, subsequent 06/15/2012  . Gout 12/04/2011  . Osteoarthritis of right knee 06/05/2011  . Hypertension 12/03/2010  . Hyperlipidemia 12/03/2010  . Coronary arteriosclerosis 12/03/2010    Past Surgical History:  Procedure Laterality Date  . CARDIAC SURGERY    . CORONARY ARTERY BYPASS GRAFT         Home Medications    Prior to Admission medications   Medication Sig Start Date End Date Taking? Authorizing Provider  allopurinol (ZYLOPRIM) 100 MG tablet Take 1 tablet (100 mg total) by mouth daily. 11/07/15   Shelia MediaJennifer A Walker, MD  amLODipine (NORVASC) 5 MG tablet TAKE ONE TABLET BY MOUTH DAILY 11/07/15   Shelia MediaJennifer A Walker, MD  cetirizine (ZYRTEC) 10 MG tablet Take 10 mg by mouth daily as needed.     Historical Provider, MD  fluocinonide-emollient (LIDEX-E) 0.05 % cream Apply 1 application topically 2 (two) times daily. 02/12/16   Glori LuisEric G  Sonnenberg, MD  rivaroxaban (XARELTO) 20 MG TABS tablet Take by mouth. 01/19/16   Historical Provider, MD  rosuvastatin (CRESTOR) 40 MG tablet Take 1 tablet (40 mg total) by mouth daily. 11/07/15   Shelia MediaJennifer A Walker, MD    Family History Family History  Problem Relation Age of Onset  . Heart disease Father     Social History Social History  Substance Use Topics  . Smoking status: Never Smoker  . Smokeless tobacco: Never Used  . Alcohol use 0.0 oz/week     Allergies   Augmentin [amoxicillin-pot clavulanate]; Dust mite extract; and Pollen extract   Review of Systems Review of Systems   Physical Exam Triage Vital Signs ED Triage Vitals  Enc Vitals Group     BP 04/06/16 1048 (!) 164/83     Pulse Rate 04/06/16 1048 80     Resp 04/06/16 1048 20     Temp --      Temp src --      SpO2 04/06/16 1048 97 %     Weight 04/06/16 1037 219 lb (99.3 kg)     Height 04/06/16 1037 6' (1.829 m)     Head Circumference --      Peak Flow --      Pain Score 04/06/16 1038 7     Pain Loc --      Pain Edu? --  Excl. in GC? --    No data found.   Updated Vital Signs BP (!) 164/83 (BP Location: Left Arm)   Pulse 80   Temp 97.6 F (36.4 C) (Oral)   Resp 20   Ht 6' (1.829 m)   Wt 219 lb (99.3 kg)   SpO2 97%   BMI 29.70 kg/m   Visual Acuity Right Eye Distance:   Left Eye Distance:   Bilateral Distance:    Right Eye Near:   Left Eye Near:    Bilateral Near:     Physical Exam  Constitutional: He appears well-developed and well-nourished. No distress.  HENT:  Head: Normocephalic and atraumatic.  Mouth/Throat: Uvula is midline and mucous membranes are normal. No tonsillar abscesses.  Neck: Normal range of motion. Neck supple. No tracheal deviation present. No thyromegaly present.  Cardiovascular: Normal rate, regular rhythm and normal heart sounds.   Pulmonary/Chest: Effort normal and breath sounds normal. No stridor. No respiratory distress. He has no wheezes. He has no  rales. He exhibits no tenderness.  Abdominal: Soft. Bowel sounds are normal.  Lymphadenopathy:    He has no cervical adenopathy.  Neurological: He is alert.  Skin: Skin is warm and dry. No rash noted. He is not diaphoretic.  Nursing note and vitals reviewed.    UC Treatments / Results  Labs (all labs ordered are listed, but only abnormal results are displayed) Labs Reviewed - No data to display  EKG  EKG Interpretation None       Radiology No results found.  Procedures ED EKG Date/Time: 04/06/2016 11:16 AM Performed by: Payton MccallumONTY, Azra Abrell Authorized by: Payton MccallumONTY, Korina Tretter   ECG reviewed by ED Physician in the absence of a cardiologist: yes   Interpretation:    Interpretation: abnormal   Rate:    ECG rate assessment: normal   Rhythm:    Rhythm: paced   Conduction:    Conduction: abnormal     Abnormal conduction: complete LBBB   Comments:     Demand pacemaker; sinus rhythm with fusion complexes; LBBB   (including critical care time)  Medications Ordered in UC Medications - No data to display   Initial Impression / Assessment and Plan / UC Course  I have reviewed the triage vital signs and the nursing notes.  Pertinent labs & imaging results that were available during my care of the patient were reviewed by me and considered in my medical decision making (see chart for details).  Clinical Course      Final Clinical Impressions(s) / UC Diagnoses   Final diagnoses:  Epigastric pain  CAD S/P percutaneous coronary angioplasty  Hx of CABG  Mobitz type 2 second degree AV block  H/O cardiac pacemaker  Atypical chest pain    New Prescriptions Discharge Medication List as of 04/06/2016 11:01 AM     1. Possible/potential diagnoses reviewed with patient and daughter; explained to both patient and daughter that due to patient's extensive cardiac history and current symptoms, I would recommend transport to ED by EMS for further evaluation and management. Report  [facility].     Payton Mccallumrlando Jordie Skalsky, MD 04/06/16 1120

## 2016-04-23 DIAGNOSIS — I4430 Unspecified atrioventricular block: Secondary | ICD-10-CM | POA: Diagnosis not present

## 2016-04-24 DIAGNOSIS — Z951 Presence of aortocoronary bypass graft: Secondary | ICD-10-CM | POA: Diagnosis not present

## 2016-04-24 DIAGNOSIS — I480 Paroxysmal atrial fibrillation: Secondary | ICD-10-CM | POA: Diagnosis not present

## 2016-04-24 DIAGNOSIS — I10 Essential (primary) hypertension: Secondary | ICD-10-CM | POA: Diagnosis not present

## 2016-04-26 ENCOUNTER — Ambulatory Visit: Payer: Medicare Other

## 2016-05-07 ENCOUNTER — Ambulatory Visit: Payer: Medicare Other

## 2016-05-13 ENCOUNTER — Other Ambulatory Visit: Payer: Self-pay | Admitting: Family Medicine

## 2016-05-13 MED ORDER — OSELTAMIVIR PHOSPHATE 75 MG PO CAPS: 75.0000 mg | ORAL_CAPSULE | Freq: Every day | ORAL | 0 refills | Status: DC

## 2016-05-13 NOTE — Progress Notes (Signed)
I received a my chart message from the patient's wife. Their son has been in town and has been diagnosed with the flu and is under another doctor's care. They are requesting prophylactic Tamiflu. Given the patient's age and exposure I believe this is reasonable. Creatinine clearance was checked and should be able to tolerate 75 mg dosing. This will be sent to their pharmacy.

## 2016-05-15 ENCOUNTER — Telehealth: Payer: Self-pay | Admitting: Family Medicine

## 2016-07-24 ENCOUNTER — Ambulatory Visit: Payer: Medicare Other

## 2016-07-30 DIAGNOSIS — I4430 Unspecified atrioventricular block: Secondary | ICD-10-CM | POA: Diagnosis not present

## 2016-09-24 ENCOUNTER — Ambulatory Visit: Payer: Medicare Other

## 2016-10-07 ENCOUNTER — Ambulatory Visit (INDEPENDENT_AMBULATORY_CARE_PROVIDER_SITE_OTHER): Payer: Medicare Other

## 2016-10-07 VITALS — BP 128/64 | HR 61 | Temp 98.4°F | Resp 14 | Ht 72.0 in | Wt 216.4 lb

## 2016-10-07 DIAGNOSIS — Z0000 Encounter for general adult medical examination without abnormal findings: Secondary | ICD-10-CM | POA: Diagnosis not present

## 2016-10-07 DIAGNOSIS — Z1389 Encounter for screening for other disorder: Secondary | ICD-10-CM | POA: Diagnosis not present

## 2016-10-07 DIAGNOSIS — Z1331 Encounter for screening for depression: Secondary | ICD-10-CM

## 2016-10-07 NOTE — Progress Notes (Signed)
Subjective:   Blake Osborne Blake Osborne is a 76 y.o. male who presents for Medicare Annual/Subsequent preventive examination.  Review of Systems:  No ROS.  Medicare Wellness Visit. Additional risk factors are reflected in the social history. Cardiac Risk Factors include: advanced age (>4055men, 48>65 women);male gender;hypertension     Objective:    Vitals: BP 128/64 (BP Location: Left Arm, Patient Position: Sitting, Cuff Size: Normal)   Pulse 61   Temp 98.4 F (36.9 C) (Oral)   Resp 14   Ht 6' (1.829 m)   Wt 216 lb 6.4 oz (98.2 kg)   SpO2 96%   BMI 29.35 kg/m   Body mass index is 29.35 kg/m.  Tobacco History  Smoking Status  . Never Smoker  Smokeless Tobacco  . Never Used     Counseling given: Not Answered   Past Medical History:  Diagnosis Date  . Atrial fibrillation (HCC)   . Coronary arteriosclerosis due to lipid rich plaque    [facility]  . Hyperlipidemia   . Hypertension   . Left BB block NEC   . Pacemaker    Past Surgical History:  Procedure Laterality Date  . CARDIAC SURGERY    . CORONARY ARTERY BYPASS GRAFT     Family History  Problem Relation Age of Onset  . Heart disease Father   . Aneurysm Brother        heart   History  Sexual Activity  . Sexual activity: Not on file    Outpatient Encounter Prescriptions as of 10/07/2016  Medication Sig  . allopurinol (ZYLOPRIM) 100 MG tablet Take 1 tablet (100 mg total) by mouth daily.  Marland Kitchen. amLODipine (NORVASC) 5 MG tablet TAKE ONE TABLET BY MOUTH DAILY  . cetirizine (ZYRTEC) 10 MG tablet Take 10 mg by mouth daily as needed.   . fluocinonide-emollient (LIDEX-E) 0.05 % cream Apply 1 application topically 2 (two) times daily.  . rivaroxaban (XARELTO) 20 MG TABS tablet Take by mouth.  . rosuvastatin (CRESTOR) 40 MG tablet Take 1 tablet (40 mg total) by mouth daily.  . [DISCONTINUED] oseltamivir (TAMIFLU) 75 MG capsule Take 1 capsule (75 mg total) by mouth daily.   No  facility-administered encounter medications on file as of 10/07/2016.     Activities of Daily Living In your present state of health, do you have any difficulty performing the following activities: 10/07/2016  Hearing? Y  Vision? N  Difficulty concentrating or making decisions? N  Walking or climbing stairs? N  Dressing or bathing? N  Doing errands, shopping? N  Preparing Food and eating ? N  Using the Toilet? N  In the past six months, have you accidently leaked urine? N  Do you have problems with loss of bowel control? N  Managing your Medications? N  Managing your Finances? N  Housekeeping or managing your Housekeeping? N  Some recent data might be hidden    Patient Care Team: Glori LuisSonnenberg, Eric G, MD as PCP - General (Family Medicine)   Assessment:    This is a routine wellness examination for Blake Osborne. The goal of the wellness visit is to assist the patient how to close the gaps in care and create a preventative care plan for the patient.   The roster of all physicians providing medical care to patient is listed in the Snapshot section of the chart.  Osteoporosis risk reviewed.    Safety issues reviewed; Smoke and carbon monoxide detectors in the home. No firearms in the home.  Wears seatbelts when driving or riding with others. Patient does wear sunscreen or protective clothing when in direct sunlight. No violence in the home.  Depression- PHQ 2 &9 complete.  No signs/symptoms or verbal communication regarding little pleasure in doing things, feeling down, depressed or hopeless. No changes in sleeping, energy, eating, concentrating.  No thoughts of self harm or harm towards others.  Time spent on this topic is 8 minutes.   Patient is alert, normal appearance, oriented to person/place/and time.  Correctly identified the president of the [place], recall of 3/3 words, and performing simple calculations. Displays appropriate judgement and can read correct time from watch face.   No  new identified risk were noted.  No failures at ADL's or IADL's.    BMI- discussed the importance of a healthy diet, water intake and the benefits of aerobic exercise. Educational material provided.   Daily fluid intake: 2 cups of caffeine, 2 cups of water, 2 beer  Dental- every 6 months.  [employer].  Works 3 hours a daily, sitting/standing.  148.0 Paroxysmal atrial fib- followed by Dr. Presley Raddle ([facility], see care everywhere.)  Eye- Visual acuity not assessed per patient preference since they have regular follow up with the ophthalmologist.  Wears corrective lenses when reading.  Sleep patterns- Sleeps 7  hours at night.  Wakes feeling rested.   TDAP vaccine deferred per patient preference.  Follow up with insurance.  Educational material provided.  Patient Concerns: None at this time. Follow up with PCP as needed.  Exercise Activities and Dietary recommendations Current Exercise Habits: Home exercise routine, Type of exercise: walking, Time (Minutes): 20, Frequency (Times/Week): 7, Weekly Exercise (Minutes/Week): 140, Intensity: Mild  Goals    . Increase water intake          Stay hydrated      Fall Risk Fall Risk  10/07/2016 11/07/2015 05/24/2014 06/15/2012  Falls in the past year? No No No No   Depression Screen PHQ 2/9 Scores 10/07/2016 11/07/2015 05/24/2014 06/15/2012  PHQ - 2 Score 0 0 0 0  PHQ- 9 Score 0 - - -    Cognitive Function MMSE - Mini Mental State Exam 10/07/2016  Orientation to time 5  Orientation to Place 5  Registration 3  Attention/ Calculation 5  Recall 3  Language- name 2 objects 2  Language- repeat 1  Language- follow 3 step command 3  Language- read & follow direction 1  Write a sentence 1  Copy design 1  Total score 30        Immunization History  Administered Date(s) Administered  . Influenza Split 01/14/2011, 01/16/2012, 02/08/2014  . Influenza-Unspecified 03/15/2015  . Pneumococcal Conjugate-13 02/08/2014  . Pneumococcal Polysaccharide-23  06/15/2008  . Zoster 06/16/2010   Screening Tests Health Maintenance  Topic Date Due  . TETANUS/TDAP  01/10/1960  . INFLUENZA VACCINE  11/13/2016  . PNA vac Low Risk Adult  Completed      Plan:    End of life planning; Advance aging; Advanced directives discussed. Copy of current HCPOA/Living Will requested.    I have personally reviewed and noted the following in the patient's chart:   . Medical and social history . Use of alcohol, tobacco or illicit drugs  . Current medications and supplements . Functional ability and status . Nutritional status . Physical activity . Advanced directives . List of other physicians . Hospitalizations, surgeries, and ER visits in previous 12 months . Vitals . Screenings to include cognitive, depression, and falls . Referrals and appointments  In addition, I have reviewed and discussed with patient certain preventive protocols, quality metrics, and best practice recommendations. A written personalized care plan for preventive services as well as general preventive health recommendations were provided to patient.     Ashok Pall, LPN  12/28/7827

## 2016-10-07 NOTE — Patient Instructions (Addendum)
  Mr. Blake Osborne , Thank you for taking time to come for your Medicare Wellness Visit. I appreciate your ongoing commitment to your health goals. Please review the following plan we discussed and let me know if I can assist you in the future.   Follow up with Dr. Birdie SonsSonnenberg as needed.   Annual due 11/06/16.  Bring a copy of your Health Care Power of Attorney and/or Living Will to be scanned into chart.  Consider TDAP vaccine.  Have a great day!  These are the goals we discussed: Goals    . Increase water intake          Stay hydrated       This is a list of the screening recommended for you and due dates:  Health Maintenance  Topic Date Due  . Tetanus Vaccine  01/10/1959  . Flu Shot  11/13/2016  . Pneumonia vaccines  Completed

## 2016-10-15 ENCOUNTER — Ambulatory Visit (INDEPENDENT_AMBULATORY_CARE_PROVIDER_SITE_OTHER): Payer: Medicare Other | Admitting: Family

## 2016-10-15 ENCOUNTER — Telehealth: Payer: Self-pay | Admitting: *Deleted

## 2016-10-15 VITALS — BP 138/78 | HR 72 | Temp 98.3°F | Ht 72.0 in | Wt 218.6 lb

## 2016-10-15 DIAGNOSIS — L237 Allergic contact dermatitis due to plants, except food: Secondary | ICD-10-CM

## 2016-10-15 MED ORDER — PREDNISONE 10 MG PO TABS: ORAL_TABLET | ORAL | 0 refills | Status: DC

## 2016-10-15 NOTE — Progress Notes (Signed)
Subjective:    Patient ID: Blake Osborne, male    DOB: [birth year], 76 y.o.   MRN: [id number]  CC: Blake Osborne is a 76 y.o. male who presents today for an acute visit.    HPI: Chief complaint of rash on bilateral legs x 5 days , worsening. Onset working in yard Allergic to poison ivy Tried topical steroid with little relief.    oral prednisone for past poison ivy and did well medication  Pacemaker ( 2011), LBBB Atrial fib  No chest pain, palpitations. Compliant with blood pressure medication. Monitors blood pressure at home.     HISTORY:  Past Medical History:  Diagnosis Date  . Atrial fibrillation (HCC)   . Coronary arteriosclerosis due to lipid rich plaque    s/p 3vessel CABG at [facility] 2011  . Hyperlipidemia   . Hypertension   . Left BB block NEC   . Pacemaker    Past Surgical History:  Procedure Laterality Date  . CARDIAC SURGERY    . CORONARY ARTERY BYPASS GRAFT     Family History  Problem Relation Age of Onset  . Heart disease Father   . Aneurysm Brother        heart    Allergies: Augmentin [amoxicillin-pot clavulanate]; Dust mite extract; Other; and Pollen extract Current Outpatient Prescriptions on File Prior to Visit  Medication Sig Dispense Refill  . allopurinol (ZYLOPRIM) 100 MG tablet Take 1 tablet (100 mg total) by mouth daily. 90 tablet 3  . amLODipine (NORVASC) 5 MG tablet TAKE ONE TABLET BY MOUTH DAILY 90 tablet 3  . cetirizine (ZYRTEC) 10 MG tablet Take 10 mg by mouth daily as needed.     . fluocinonide-emollient (LIDEX-E) 0.05 % cream Apply 1 application topically 2 (two) times daily. 60 g 2  . rivaroxaban (XARELTO) 20 MG TABS tablet Take by mouth.    . rosuvastatin (CRESTOR) 40 MG tablet Take 1 tablet (40 mg total) by mouth daily. 90 tablet 3   No current facility-administered medications on file prior to visit.     Social History  Substance Use Topics  . Smoking status: Never Smoker  . Smokeless tobacco:  Never Used  . Alcohol use 0.0 oz/week    Review of Systems  Constitutional: Negative for chills and fever.  Respiratory: Negative for cough.   Cardiovascular: Negative for chest pain and palpitations.  Gastrointestinal: Negative for nausea and vomiting.  Skin: Positive for rash.      Objective:    BP 138/78   Pulse 72   Temp 98.3 F (36.8 C) (Oral)   Ht 6' (1.829 m)   Wt 218 lb 9.6 oz (99.2 kg)   SpO2 94%   BMI 29.65 kg/m    Physical Exam  Constitutional: He appears well-developed and well-nourished.  Cardiovascular: Regular rhythm and normal heart sounds.   Pulmonary/Chest: Effort normal and breath sounds normal. No respiratory distress. He has no wheezes. He has no rhonchi. He has no rales.  Neurological: He is alert.  Skin: Skin is warm and dry. Rash noted. Rash is vesicular.  Grouped erythematous clear-filled vesicular lesions noted bilateral lower extremities. No streaking, purulent discharge. No increased warmth.  Psychiatric: He has a normal mood and affect. His speech is normal and behavior is normal.  Vitals reviewed.      Assessment & Plan:   1. Allergic contact dermatitis due to plants, except food Afebrile. Well-appearing. Failed topical steroid. We'll start prednisone by mouth. Discussed with patient side effects including  elevations of blood pressure, palpitations. Patient understands to closely monitor due to history of hypertension, atrial fib, left bundle branch block. Return precautions given   - predniSONE (DELTASONE) 10 MG tablet; Take 4 tablets ( total 40 mg) by mouth for 2 days; take 3 tablets ( total 30 mg) by mouth for 2 days; take 2 tablets ( total 20 mg) by mouth for 1 day; take 1 tablet ( total 10 mg) by mouth for 1 day.  Dispense: 17 tablet; Refill: 0    I am having Blake Osborne start on predniSONE. I am also having him maintain his cetirizine, amLODipine, allopurinol, rosuvastatin, rivaroxaban, and fluocinonide-emollient.   Meds ordered  this encounter  Medications  . predniSONE (DELTASONE) 10 MG tablet    Sig: Take 4 tablets ( total 40 mg) by mouth for 2 days; take 3 tablets ( total 30 mg) by mouth for 2 days; take 2 tablets ( total 20 mg) by mouth for 1 day; take 1 tablet ( total 10 mg) by mouth for 1 day.    Dispense:  17 tablet    Refill:  0    Order Specific Question:   Supervising Provider    Answer:   Sherlene ShamsULLO, TERESA L [[id number]]    Return precautions given.   Risks, benefits, and alternatives of the medications and treatment plan prescribed today were discussed, and patient expressed understanding.   Education regarding symptom management and diagnosis given to patient on AVS.  Continue to follow with Glori LuisSonnenberg, Eric G, MD for routine health maintenance.   Beola CordJoseph Terrence Konecny and I agreed with plan.   Rennie PlowmanMargaret Arnett, FNP

## 2016-10-15 NOTE — Progress Notes (Signed)
Pre visit review using our clinic review tool, if applicable. No additional management support is needed unless otherwise documented below in the visit note. 

## 2016-10-15 NOTE — Patient Instructions (Addendum)
Start prednisone   As discussed, please watch Blood pressure closely and monitor for heart palpitations.  Take prednisone early in day as interferes with sleep  Let us know if not better   Contact Dermatitis Dermatitis is redness, soreness, and swelling (inflammation) of the skin. Contact dermatitis is a reaction to certain substances that touch the skin. There are two types of contact dermatitis:  Irritant contact dermatitis. This type is caused by something that irritates your skin, such as dry hands from washing them too much. This type does not require previous exposure to the substance for a reaction to occur. This type is more common.  Allergic contact dermatitis. This type is caused by a substance that you are allergic to, such as a nickel allergy or poison ivy. This type only occurs if you have been exposed to the substance (allergen) before. Upon a repeat exposure, your body reacts to the substance. This type is less common.  What are the causes? Many different substances can cause contact dermatitis. Irritant contact dermatitis is most commonly caused by exposure to:  Makeup.  Soaps.  Detergents.  Bleaches.  Acids.  Metal salts, such as nickel.  Allergic contact dermatitis is most commonly caused by exposure to:  Poisonous plants.  Chemicals.  Jewelry.  Latex.  Medicines.  Preservatives in products, such as clothing.  What increases the risk? This condition is more likely to develop in:  People who have jobs that expose them to irritants or allergens.  People who have certain medical conditions, such as asthma or eczema.  What are the signs or symptoms? Symptoms of this condition may occur anywhere on your body where the irritant has touched you or is touched by you. Symptoms include:  Dryness or flaking.  Redness.  Cracks.  Itching.  Pain or a burning feeling.  Blisters.  Drainage of small amounts of blood or clear fluid from skin  cracks.  With allergic contact dermatitis, there may also be swelling in areas such as the eyelids, mouth, or genitals. How is this diagnosed? This condition is diagnosed with a medical history and physical exam. A patch skin test may be performed to help determine the cause. If the condition is related to your job, you may need to see an occupational medicine specialist. How is this treated? Treatment for this condition includes figuring out what caused the reaction and protecting your skin from further contact. Treatment may also include:  Steroid creams or ointments. Oral steroid medicines may be needed in more severe cases.  Antibiotics or antibacterial ointments, if a skin infection is present.  Antihistamine lotion or an antihistamine taken by mouth to ease itching.  A bandage (dressing).  Follow these instructions at home: Skin Care  Moisturize your skin as needed.  Apply cool compresses to the affected areas.  Try taking a bath with: ? Epsom salts. Follow the instructions on the packaging. You can get these at your local pharmacy or grocery store. ? Baking soda. Pour a small amount into the bath as directed by your health care provider. ? Colloidal oatmeal. Follow the instructions on the packaging. You can get this at your local pharmacy or grocery store.  Try applying baking soda paste to your skin. Stir water into baking soda until it reaches a paste-like consistency.  Do not scratch your skin.  Bathe less frequently, such as every other day.  Bathe in lukewarm water. Avoid using hot water. Medicines  Take or apply over-the-counter and prescription medicines only as told  by your health care provider.  If you were prescribed an antibiotic medicine, take or apply your antibiotic as told by your health care provider. Do not stop using the antibiotic even if your condition starts to improve. General instructions  Keep all follow-up visits as told by your health care  provider. This is important.  Avoid the substance that caused your reaction. If you do not know what caused it, keep a journal to try to track what caused it. Write down: ? What you eat. ? What cosmetic products you use. ? What you drink. ? What you wear in the affected area. This includes jewelry.  If you were given a dressing, take care of it as told by your health care provider. This includes when to change and remove it. Contact a health care provider if:  Your condition does not improve with treatment.  Your condition gets worse.  You have signs of infection such as swelling, tenderness, redness, soreness, or warmth in the affected area.  You have a fever.  You have new symptoms. Get help right away if:  You have a severe headache, neck pain, or neck stiffness.  You vomit.  You feel very sleepy.  You notice red streaks coming from the affected area.  Your bone or joint underneath the affected area becomes painful after the skin has healed.  The affected area turns darker.  You have difficulty breathing. This information is not intended to replace advice given to you by your health care provider. Make sure you discuss any questions you have with your health care provider. Document Released: 03/29/2000 Document Revised: 09/07/2015 Document Reviewed: 08/17/2014 Elsevier Interactive Patient Education  2018 [employer]

## 2016-10-15 NOTE — Telephone Encounter (Signed)
Patient stated the pharmacy did not receive the Rx for  prednisone  Pharmacy [facility] [place]

## 2016-10-15 NOTE — Telephone Encounter (Signed)
Medication has been refaxed.

## 2016-10-31 DIAGNOSIS — Z950 Presence of cardiac pacemaker: Secondary | ICD-10-CM | POA: Diagnosis not present

## 2016-10-31 DIAGNOSIS — I4430 Unspecified atrioventricular block: Secondary | ICD-10-CM | POA: Diagnosis not present

## 2016-12-05 DIAGNOSIS — I638 Other cerebral infarction: Secondary | ICD-10-CM | POA: Diagnosis not present

## 2016-12-05 DIAGNOSIS — R4182 Altered mental status, unspecified: Secondary | ICD-10-CM | POA: Diagnosis not present

## 2016-12-05 DIAGNOSIS — E782 Mixed hyperlipidemia: Secondary | ICD-10-CM | POA: Diagnosis not present

## 2016-12-05 DIAGNOSIS — I629 Nontraumatic intracranial hemorrhage, unspecified: Secondary | ICD-10-CM | POA: Diagnosis not present

## 2016-12-05 DIAGNOSIS — Z8679 Personal history of other diseases of the circulatory system: Secondary | ICD-10-CM | POA: Diagnosis not present

## 2016-12-05 DIAGNOSIS — Z950 Presence of cardiac pacemaker: Secondary | ICD-10-CM | POA: Diagnosis not present

## 2016-12-05 DIAGNOSIS — H547 Unspecified visual loss: Secondary | ICD-10-CM | POA: Diagnosis not present

## 2016-12-05 DIAGNOSIS — Z955 Presence of coronary angioplasty implant and graft: Secondary | ICD-10-CM | POA: Diagnosis not present

## 2016-12-05 DIAGNOSIS — H5702 Anisocoria: Secondary | ICD-10-CM | POA: Diagnosis present

## 2016-12-05 DIAGNOSIS — H539 Unspecified visual disturbance: Secondary | ICD-10-CM | POA: Diagnosis not present

## 2016-12-05 DIAGNOSIS — I2510 Atherosclerotic heart disease of native coronary artery without angina pectoris: Secondary | ICD-10-CM | POA: Diagnosis present

## 2016-12-05 DIAGNOSIS — I618 Other nontraumatic intracerebral hemorrhage: Secondary | ICD-10-CM | POA: Diagnosis not present

## 2016-12-05 DIAGNOSIS — G936 Cerebral edema: Secondary | ICD-10-CM | POA: Diagnosis not present

## 2016-12-05 DIAGNOSIS — I480 Paroxysmal atrial fibrillation: Secondary | ICD-10-CM | POA: Diagnosis not present

## 2016-12-05 DIAGNOSIS — I69320 Aphasia following cerebral infarction: Secondary | ICD-10-CM | POA: Diagnosis not present

## 2016-12-05 DIAGNOSIS — E785 Hyperlipidemia, unspecified: Secondary | ICD-10-CM | POA: Diagnosis not present

## 2016-12-05 DIAGNOSIS — R4701 Aphasia: Secondary | ICD-10-CM | POA: Diagnosis not present

## 2016-12-05 DIAGNOSIS — G935 Compression of brain: Secondary | ICD-10-CM | POA: Diagnosis not present

## 2016-12-05 DIAGNOSIS — R791 Abnormal coagulation profile: Secondary | ICD-10-CM | POA: Diagnosis present

## 2016-12-05 DIAGNOSIS — I611 Nontraumatic intracerebral hemorrhage in hemisphere, cortical: Secondary | ICD-10-CM | POA: Diagnosis not present

## 2016-12-05 DIAGNOSIS — G4733 Obstructive sleep apnea (adult) (pediatric): Secondary | ICD-10-CM | POA: Diagnosis present

## 2016-12-05 DIAGNOSIS — M109 Gout, unspecified: Secondary | ICD-10-CM | POA: Diagnosis present

## 2016-12-05 DIAGNOSIS — M4802 Spinal stenosis, cervical region: Secondary | ICD-10-CM | POA: Diagnosis present

## 2016-12-05 DIAGNOSIS — Y33XXXA Other specified events, undetermined intent, initial encounter: Secondary | ICD-10-CM | POA: Diagnosis not present

## 2016-12-05 DIAGNOSIS — Z7901 Long term (current) use of anticoagulants: Secondary | ICD-10-CM | POA: Diagnosis not present

## 2016-12-05 DIAGNOSIS — Z9119 Patient's noncompliance with other medical treatment and regimen: Secondary | ICD-10-CM | POA: Diagnosis not present

## 2016-12-05 DIAGNOSIS — Z951 Presence of aortocoronary bypass graft: Secondary | ICD-10-CM | POA: Diagnosis not present

## 2016-12-05 DIAGNOSIS — S06350A Traumatic hemorrhage of left cerebrum without loss of consciousness, initial encounter: Secondary | ICD-10-CM | POA: Diagnosis not present

## 2016-12-05 DIAGNOSIS — T45515A Adverse effect of anticoagulants, initial encounter: Secondary | ICD-10-CM | POA: Diagnosis present

## 2016-12-05 DIAGNOSIS — I10 Essential (primary) hypertension: Secondary | ICD-10-CM | POA: Diagnosis not present

## 2016-12-05 DIAGNOSIS — D6959 Other secondary thrombocytopenia: Secondary | ICD-10-CM | POA: Diagnosis present

## 2016-12-05 DIAGNOSIS — F05 Delirium due to known physiological condition: Secondary | ICD-10-CM | POA: Diagnosis not present

## 2016-12-05 DIAGNOSIS — H5461 Unqualified visual loss, right eye, normal vision left eye: Secondary | ICD-10-CM | POA: Diagnosis not present

## 2016-12-05 DIAGNOSIS — I700 Atherosclerosis of aorta: Secondary | ICD-10-CM | POA: Diagnosis present

## 2016-12-05 DIAGNOSIS — I340 Nonrheumatic mitral (valve) insufficiency: Secondary | ICD-10-CM | POA: Diagnosis not present

## 2016-12-09 DIAGNOSIS — I63532 Cerebral infarction due to unspecified occlusion or stenosis of left posterior cerebral artery: Secondary | ICD-10-CM | POA: Insufficient documentation

## 2016-12-13 DIAGNOSIS — I6789 Other cerebrovascular disease: Secondary | ICD-10-CM | POA: Diagnosis present

## 2016-12-13 DIAGNOSIS — R4701 Aphasia: Secondary | ICD-10-CM | POA: Diagnosis not present

## 2016-12-13 DIAGNOSIS — I480 Paroxysmal atrial fibrillation: Secondary | ICD-10-CM | POA: Diagnosis not present

## 2016-12-13 DIAGNOSIS — E785 Hyperlipidemia, unspecified: Secondary | ICD-10-CM | POA: Diagnosis not present

## 2016-12-13 DIAGNOSIS — I610 Nontraumatic intracerebral hemorrhage in hemisphere, subcortical: Secondary | ICD-10-CM | POA: Diagnosis not present

## 2016-12-13 DIAGNOSIS — G451 Carotid artery syndrome (hemispheric): Secondary | ICD-10-CM | POA: Diagnosis not present

## 2016-12-13 DIAGNOSIS — I2510 Atherosclerotic heart disease of native coronary artery without angina pectoris: Secondary | ICD-10-CM | POA: Diagnosis present

## 2016-12-13 DIAGNOSIS — Z9119 Patient's noncompliance with other medical treatment and regimen: Secondary | ICD-10-CM | POA: Diagnosis not present

## 2016-12-13 DIAGNOSIS — I69392 Facial weakness following cerebral infarction: Secondary | ICD-10-CM | POA: Diagnosis not present

## 2016-12-13 DIAGNOSIS — E782 Mixed hyperlipidemia: Secondary | ICD-10-CM | POA: Diagnosis not present

## 2016-12-13 DIAGNOSIS — I619 Nontraumatic intracerebral hemorrhage, unspecified: Secondary | ICD-10-CM | POA: Diagnosis not present

## 2016-12-13 DIAGNOSIS — I69322 Dysarthria following cerebral infarction: Secondary | ICD-10-CM | POA: Diagnosis not present

## 2016-12-13 DIAGNOSIS — R51 Headache: Secondary | ICD-10-CM | POA: Diagnosis present

## 2016-12-13 DIAGNOSIS — M6289 Other specified disorders of muscle: Secondary | ICD-10-CM | POA: Diagnosis not present

## 2016-12-13 DIAGNOSIS — R4182 Altered mental status, unspecified: Secondary | ICD-10-CM | POA: Diagnosis not present

## 2016-12-13 DIAGNOSIS — I638 Other cerebral infarction: Secondary | ICD-10-CM | POA: Diagnosis not present

## 2016-12-13 DIAGNOSIS — Z950 Presence of cardiac pacemaker: Secondary | ICD-10-CM | POA: Diagnosis not present

## 2016-12-13 DIAGNOSIS — I10 Essential (primary) hypertension: Secondary | ICD-10-CM | POA: Diagnosis not present

## 2016-12-13 DIAGNOSIS — R4781 Slurred speech: Secondary | ICD-10-CM | POA: Diagnosis not present

## 2016-12-13 DIAGNOSIS — Z955 Presence of coronary angioplasty implant and graft: Secondary | ICD-10-CM | POA: Diagnosis not present

## 2016-12-13 DIAGNOSIS — G8194 Hemiplegia, unspecified affecting left nondominant side: Secondary | ICD-10-CM | POA: Diagnosis not present

## 2016-12-17 DIAGNOSIS — I638 Other cerebral infarction: Secondary | ICD-10-CM | POA: Diagnosis not present

## 2016-12-20 DIAGNOSIS — I69320 Aphasia following cerebral infarction: Secondary | ICD-10-CM | POA: Diagnosis not present

## 2016-12-20 DIAGNOSIS — Z9981 Dependence on supplemental oxygen: Secondary | ICD-10-CM | POA: Diagnosis not present

## 2016-12-20 DIAGNOSIS — I10 Essential (primary) hypertension: Secondary | ICD-10-CM | POA: Diagnosis not present

## 2016-12-20 DIAGNOSIS — Z951 Presence of aortocoronary bypass graft: Secondary | ICD-10-CM | POA: Diagnosis not present

## 2016-12-20 DIAGNOSIS — I69351 Hemiplegia and hemiparesis following cerebral infarction affecting right dominant side: Secondary | ICD-10-CM | POA: Diagnosis not present

## 2016-12-20 DIAGNOSIS — I480 Paroxysmal atrial fibrillation: Secondary | ICD-10-CM | POA: Diagnosis not present

## 2016-12-20 DIAGNOSIS — I272 Pulmonary hypertension, unspecified: Secondary | ICD-10-CM | POA: Diagnosis not present

## 2016-12-20 DIAGNOSIS — E785 Hyperlipidemia, unspecified: Secondary | ICD-10-CM | POA: Diagnosis not present

## 2016-12-20 DIAGNOSIS — I2510 Atherosclerotic heart disease of native coronary artery without angina pectoris: Secondary | ICD-10-CM | POA: Diagnosis not present

## 2016-12-21 DIAGNOSIS — I2510 Atherosclerotic heart disease of native coronary artery without angina pectoris: Secondary | ICD-10-CM | POA: Diagnosis not present

## 2016-12-21 DIAGNOSIS — I69351 Hemiplegia and hemiparesis following cerebral infarction affecting right dominant side: Secondary | ICD-10-CM | POA: Diagnosis not present

## 2016-12-21 DIAGNOSIS — I10 Essential (primary) hypertension: Secondary | ICD-10-CM | POA: Diagnosis not present

## 2016-12-21 DIAGNOSIS — I272 Pulmonary hypertension, unspecified: Secondary | ICD-10-CM | POA: Diagnosis not present

## 2016-12-21 DIAGNOSIS — I480 Paroxysmal atrial fibrillation: Secondary | ICD-10-CM | POA: Diagnosis not present

## 2016-12-21 DIAGNOSIS — I69320 Aphasia following cerebral infarction: Secondary | ICD-10-CM | POA: Diagnosis not present

## 2016-12-23 DIAGNOSIS — I10 Essential (primary) hypertension: Secondary | ICD-10-CM | POA: Diagnosis not present

## 2016-12-23 DIAGNOSIS — I480 Paroxysmal atrial fibrillation: Secondary | ICD-10-CM | POA: Diagnosis not present

## 2016-12-23 DIAGNOSIS — I2510 Atherosclerotic heart disease of native coronary artery without angina pectoris: Secondary | ICD-10-CM | POA: Diagnosis not present

## 2016-12-23 DIAGNOSIS — I69351 Hemiplegia and hemiparesis following cerebral infarction affecting right dominant side: Secondary | ICD-10-CM | POA: Diagnosis not present

## 2016-12-23 DIAGNOSIS — I272 Pulmonary hypertension, unspecified: Secondary | ICD-10-CM | POA: Diagnosis not present

## 2016-12-23 DIAGNOSIS — I69320 Aphasia following cerebral infarction: Secondary | ICD-10-CM | POA: Diagnosis not present

## 2016-12-24 ENCOUNTER — Telehealth: Payer: Self-pay | Admitting: Family Medicine

## 2016-12-24 NOTE — Telephone Encounter (Signed)
Please advise 

## 2016-12-24 NOTE — Telephone Encounter (Signed)
Robin an OT [facility] from [facility] and is looking for verbal orders for 1x1 2x3 for upper extremity exercise and Adl safety. Please advise, thank you!  Call Robin @ [phone]

## 2016-12-24 NOTE — Telephone Encounter (Signed)
Verbal given 

## 2016-12-24 NOTE — Telephone Encounter (Signed)
Verbal orders can be given. 

## 2016-12-26 DIAGNOSIS — I480 Paroxysmal atrial fibrillation: Secondary | ICD-10-CM | POA: Diagnosis not present

## 2016-12-26 DIAGNOSIS — I69351 Hemiplegia and hemiparesis following cerebral infarction affecting right dominant side: Secondary | ICD-10-CM | POA: Diagnosis not present

## 2016-12-26 DIAGNOSIS — I272 Pulmonary hypertension, unspecified: Secondary | ICD-10-CM | POA: Diagnosis not present

## 2016-12-26 DIAGNOSIS — I10 Essential (primary) hypertension: Secondary | ICD-10-CM | POA: Diagnosis not present

## 2016-12-26 DIAGNOSIS — I2510 Atherosclerotic heart disease of native coronary artery without angina pectoris: Secondary | ICD-10-CM | POA: Diagnosis not present

## 2016-12-26 DIAGNOSIS — I69320 Aphasia following cerebral infarction: Secondary | ICD-10-CM | POA: Diagnosis not present

## 2016-12-27 DIAGNOSIS — I10 Essential (primary) hypertension: Secondary | ICD-10-CM | POA: Diagnosis not present

## 2016-12-27 DIAGNOSIS — I69351 Hemiplegia and hemiparesis following cerebral infarction affecting right dominant side: Secondary | ICD-10-CM | POA: Diagnosis not present

## 2016-12-27 DIAGNOSIS — I2510 Atherosclerotic heart disease of native coronary artery without angina pectoris: Secondary | ICD-10-CM | POA: Diagnosis not present

## 2016-12-27 DIAGNOSIS — I69320 Aphasia following cerebral infarction: Secondary | ICD-10-CM | POA: Diagnosis not present

## 2016-12-27 DIAGNOSIS — I272 Pulmonary hypertension, unspecified: Secondary | ICD-10-CM | POA: Diagnosis not present

## 2016-12-27 DIAGNOSIS — I480 Paroxysmal atrial fibrillation: Secondary | ICD-10-CM | POA: Diagnosis not present

## 2016-12-31 DIAGNOSIS — I69351 Hemiplegia and hemiparesis following cerebral infarction affecting right dominant side: Secondary | ICD-10-CM | POA: Diagnosis not present

## 2016-12-31 DIAGNOSIS — I480 Paroxysmal atrial fibrillation: Secondary | ICD-10-CM | POA: Diagnosis not present

## 2016-12-31 DIAGNOSIS — I2510 Atherosclerotic heart disease of native coronary artery without angina pectoris: Secondary | ICD-10-CM | POA: Diagnosis not present

## 2016-12-31 DIAGNOSIS — I272 Pulmonary hypertension, unspecified: Secondary | ICD-10-CM | POA: Diagnosis not present

## 2016-12-31 DIAGNOSIS — I10 Essential (primary) hypertension: Secondary | ICD-10-CM | POA: Diagnosis not present

## 2016-12-31 DIAGNOSIS — I69320 Aphasia following cerebral infarction: Secondary | ICD-10-CM | POA: Diagnosis not present

## 2017-01-01 DIAGNOSIS — I2510 Atherosclerotic heart disease of native coronary artery without angina pectoris: Secondary | ICD-10-CM | POA: Diagnosis not present

## 2017-01-01 DIAGNOSIS — I69320 Aphasia following cerebral infarction: Secondary | ICD-10-CM | POA: Diagnosis not present

## 2017-01-01 DIAGNOSIS — I480 Paroxysmal atrial fibrillation: Secondary | ICD-10-CM | POA: Diagnosis not present

## 2017-01-01 DIAGNOSIS — I10 Essential (primary) hypertension: Secondary | ICD-10-CM | POA: Diagnosis not present

## 2017-01-01 DIAGNOSIS — I272 Pulmonary hypertension, unspecified: Secondary | ICD-10-CM | POA: Diagnosis not present

## 2017-01-01 DIAGNOSIS — I69351 Hemiplegia and hemiparesis following cerebral infarction affecting right dominant side: Secondary | ICD-10-CM | POA: Diagnosis not present

## 2017-01-03 DIAGNOSIS — I69351 Hemiplegia and hemiparesis following cerebral infarction affecting right dominant side: Secondary | ICD-10-CM | POA: Diagnosis not present

## 2017-01-03 DIAGNOSIS — I2510 Atherosclerotic heart disease of native coronary artery without angina pectoris: Secondary | ICD-10-CM | POA: Diagnosis not present

## 2017-01-03 DIAGNOSIS — I272 Pulmonary hypertension, unspecified: Secondary | ICD-10-CM | POA: Diagnosis not present

## 2017-01-03 DIAGNOSIS — I480 Paroxysmal atrial fibrillation: Secondary | ICD-10-CM | POA: Diagnosis not present

## 2017-01-03 DIAGNOSIS — I10 Essential (primary) hypertension: Secondary | ICD-10-CM | POA: Diagnosis not present

## 2017-01-03 DIAGNOSIS — I69320 Aphasia following cerebral infarction: Secondary | ICD-10-CM | POA: Diagnosis not present

## 2017-01-04 DIAGNOSIS — Z950 Presence of cardiac pacemaker: Secondary | ICD-10-CM | POA: Diagnosis not present

## 2017-01-04 DIAGNOSIS — I482 Chronic atrial fibrillation: Secondary | ICD-10-CM | POA: Diagnosis not present

## 2017-01-04 DIAGNOSIS — I613 Nontraumatic intracerebral hemorrhage in brain stem: Secondary | ICD-10-CM | POA: Diagnosis not present

## 2017-01-04 DIAGNOSIS — I25110 Atherosclerotic heart disease of native coronary artery with unstable angina pectoris: Secondary | ICD-10-CM | POA: Diagnosis not present

## 2017-01-04 DIAGNOSIS — R0789 Other chest pain: Secondary | ICD-10-CM | POA: Diagnosis not present

## 2017-01-04 DIAGNOSIS — E782 Mixed hyperlipidemia: Secondary | ICD-10-CM | POA: Diagnosis not present

## 2017-01-04 DIAGNOSIS — Z951 Presence of aortocoronary bypass graft: Secondary | ICD-10-CM | POA: Diagnosis not present

## 2017-01-04 DIAGNOSIS — R0989 Other specified symptoms and signs involving the circulatory and respiratory systems: Secondary | ICD-10-CM | POA: Diagnosis not present

## 2017-01-04 DIAGNOSIS — I25119 Atherosclerotic heart disease of native coronary artery with unspecified angina pectoris: Secondary | ICD-10-CM | POA: Diagnosis not present

## 2017-01-04 DIAGNOSIS — Z8673 Personal history of transient ischemic attack (TIA), and cerebral infarction without residual deficits: Secondary | ICD-10-CM | POA: Diagnosis not present

## 2017-01-04 DIAGNOSIS — Z7901 Long term (current) use of anticoagulants: Secondary | ICD-10-CM | POA: Diagnosis not present

## 2017-01-04 DIAGNOSIS — R4701 Aphasia: Secondary | ICD-10-CM | POA: Diagnosis not present

## 2017-01-04 DIAGNOSIS — Z79899 Other long term (current) drug therapy: Secondary | ICD-10-CM | POA: Diagnosis not present

## 2017-01-04 DIAGNOSIS — E785 Hyperlipidemia, unspecified: Secondary | ICD-10-CM | POA: Diagnosis not present

## 2017-01-04 DIAGNOSIS — I10 Essential (primary) hypertension: Secondary | ICD-10-CM | POA: Diagnosis not present

## 2017-01-04 DIAGNOSIS — I480 Paroxysmal atrial fibrillation: Secondary | ICD-10-CM | POA: Diagnosis not present

## 2017-01-05 DIAGNOSIS — E782 Mixed hyperlipidemia: Secondary | ICD-10-CM | POA: Diagnosis not present

## 2017-01-05 DIAGNOSIS — I613 Nontraumatic intracerebral hemorrhage in brain stem: Secondary | ICD-10-CM | POA: Diagnosis not present

## 2017-01-05 DIAGNOSIS — I25110 Atherosclerotic heart disease of native coronary artery with unstable angina pectoris: Secondary | ICD-10-CM | POA: Diagnosis not present

## 2017-01-05 DIAGNOSIS — I480 Paroxysmal atrial fibrillation: Secondary | ICD-10-CM | POA: Diagnosis not present

## 2017-01-05 DIAGNOSIS — R0789 Other chest pain: Secondary | ICD-10-CM | POA: Diagnosis not present

## 2017-01-05 DIAGNOSIS — I10 Essential (primary) hypertension: Secondary | ICD-10-CM | POA: Diagnosis not present

## 2017-01-06 DIAGNOSIS — I25110 Atherosclerotic heart disease of native coronary artery with unstable angina pectoris: Secondary | ICD-10-CM | POA: Diagnosis not present

## 2017-01-06 DIAGNOSIS — I613 Nontraumatic intracerebral hemorrhage in brain stem: Secondary | ICD-10-CM | POA: Diagnosis not present

## 2017-01-06 DIAGNOSIS — Z8673 Personal history of transient ischemic attack (TIA), and cerebral infarction without residual deficits: Secondary | ICD-10-CM | POA: Diagnosis not present

## 2017-01-06 DIAGNOSIS — I480 Paroxysmal atrial fibrillation: Secondary | ICD-10-CM | POA: Diagnosis not present

## 2017-01-07 DIAGNOSIS — I25110 Atherosclerotic heart disease of native coronary artery with unstable angina pectoris: Secondary | ICD-10-CM | POA: Diagnosis not present

## 2017-01-07 DIAGNOSIS — Z7901 Long term (current) use of anticoagulants: Secondary | ICD-10-CM | POA: Insufficient documentation

## 2017-01-07 DIAGNOSIS — I480 Paroxysmal atrial fibrillation: Secondary | ICD-10-CM | POA: Diagnosis not present

## 2017-01-07 DIAGNOSIS — I613 Nontraumatic intracerebral hemorrhage in brain stem: Secondary | ICD-10-CM | POA: Diagnosis not present

## 2017-01-07 DIAGNOSIS — Z8673 Personal history of transient ischemic attack (TIA), and cerebral infarction without residual deficits: Secondary | ICD-10-CM | POA: Diagnosis not present

## 2017-01-08 DIAGNOSIS — I10 Essential (primary) hypertension: Secondary | ICD-10-CM | POA: Diagnosis not present

## 2017-01-08 DIAGNOSIS — I2510 Atherosclerotic heart disease of native coronary artery without angina pectoris: Secondary | ICD-10-CM | POA: Diagnosis not present

## 2017-01-08 DIAGNOSIS — I69351 Hemiplegia and hemiparesis following cerebral infarction affecting right dominant side: Secondary | ICD-10-CM | POA: Diagnosis not present

## 2017-01-08 DIAGNOSIS — I272 Pulmonary hypertension, unspecified: Secondary | ICD-10-CM | POA: Diagnosis not present

## 2017-01-08 DIAGNOSIS — I480 Paroxysmal atrial fibrillation: Secondary | ICD-10-CM | POA: Diagnosis not present

## 2017-01-08 DIAGNOSIS — I69320 Aphasia following cerebral infarction: Secondary | ICD-10-CM | POA: Diagnosis not present

## 2017-01-09 DIAGNOSIS — I69351 Hemiplegia and hemiparesis following cerebral infarction affecting right dominant side: Secondary | ICD-10-CM | POA: Diagnosis not present

## 2017-01-09 DIAGNOSIS — I480 Paroxysmal atrial fibrillation: Secondary | ICD-10-CM | POA: Diagnosis not present

## 2017-01-09 DIAGNOSIS — I272 Pulmonary hypertension, unspecified: Secondary | ICD-10-CM | POA: Diagnosis not present

## 2017-01-09 DIAGNOSIS — I69320 Aphasia following cerebral infarction: Secondary | ICD-10-CM | POA: Diagnosis not present

## 2017-01-09 DIAGNOSIS — I10 Essential (primary) hypertension: Secondary | ICD-10-CM | POA: Diagnosis not present

## 2017-01-09 DIAGNOSIS — I2510 Atherosclerotic heart disease of native coronary artery without angina pectoris: Secondary | ICD-10-CM | POA: Diagnosis not present

## 2017-01-10 DIAGNOSIS — I2510 Atherosclerotic heart disease of native coronary artery without angina pectoris: Secondary | ICD-10-CM | POA: Diagnosis not present

## 2017-01-10 DIAGNOSIS — I69320 Aphasia following cerebral infarction: Secondary | ICD-10-CM | POA: Diagnosis not present

## 2017-01-10 DIAGNOSIS — I272 Pulmonary hypertension, unspecified: Secondary | ICD-10-CM | POA: Diagnosis not present

## 2017-01-10 DIAGNOSIS — I69351 Hemiplegia and hemiparesis following cerebral infarction affecting right dominant side: Secondary | ICD-10-CM | POA: Diagnosis not present

## 2017-01-10 DIAGNOSIS — I10 Essential (primary) hypertension: Secondary | ICD-10-CM | POA: Diagnosis not present

## 2017-01-10 DIAGNOSIS — I480 Paroxysmal atrial fibrillation: Secondary | ICD-10-CM | POA: Diagnosis not present

## 2017-01-13 DIAGNOSIS — Z7901 Long term (current) use of anticoagulants: Secondary | ICD-10-CM | POA: Diagnosis not present

## 2017-01-13 DIAGNOSIS — I10 Essential (primary) hypertension: Secondary | ICD-10-CM | POA: Diagnosis not present

## 2017-01-13 DIAGNOSIS — E785 Hyperlipidemia, unspecified: Secondary | ICD-10-CM | POA: Diagnosis not present

## 2017-01-13 DIAGNOSIS — I480 Paroxysmal atrial fibrillation: Secondary | ICD-10-CM | POA: Diagnosis not present

## 2017-01-13 DIAGNOSIS — I63532 Cerebral infarction due to unspecified occlusion or stenosis of left posterior cerebral artery: Secondary | ICD-10-CM | POA: Diagnosis not present

## 2017-01-15 DIAGNOSIS — I69320 Aphasia following cerebral infarction: Secondary | ICD-10-CM | POA: Diagnosis not present

## 2017-01-15 DIAGNOSIS — I272 Pulmonary hypertension, unspecified: Secondary | ICD-10-CM | POA: Diagnosis not present

## 2017-01-15 DIAGNOSIS — I2510 Atherosclerotic heart disease of native coronary artery without angina pectoris: Secondary | ICD-10-CM | POA: Diagnosis not present

## 2017-01-15 DIAGNOSIS — I69351 Hemiplegia and hemiparesis following cerebral infarction affecting right dominant side: Secondary | ICD-10-CM | POA: Diagnosis not present

## 2017-01-15 DIAGNOSIS — I480 Paroxysmal atrial fibrillation: Secondary | ICD-10-CM | POA: Diagnosis not present

## 2017-01-15 DIAGNOSIS — I10 Essential (primary) hypertension: Secondary | ICD-10-CM | POA: Diagnosis not present

## 2017-01-17 DIAGNOSIS — I69320 Aphasia following cerebral infarction: Secondary | ICD-10-CM | POA: Diagnosis not present

## 2017-01-17 DIAGNOSIS — I10 Essential (primary) hypertension: Secondary | ICD-10-CM | POA: Diagnosis not present

## 2017-01-17 DIAGNOSIS — I272 Pulmonary hypertension, unspecified: Secondary | ICD-10-CM | POA: Diagnosis not present

## 2017-01-17 DIAGNOSIS — I2510 Atherosclerotic heart disease of native coronary artery without angina pectoris: Secondary | ICD-10-CM | POA: Diagnosis not present

## 2017-01-17 DIAGNOSIS — I480 Paroxysmal atrial fibrillation: Secondary | ICD-10-CM | POA: Diagnosis not present

## 2017-01-17 DIAGNOSIS — I69351 Hemiplegia and hemiparesis following cerebral infarction affecting right dominant side: Secondary | ICD-10-CM | POA: Diagnosis not present

## 2017-01-20 ENCOUNTER — Other Ambulatory Visit: Payer: Self-pay

## 2017-01-20 MED ORDER — ROSUVASTATIN CALCIUM 40 MG PO TABS: 40.0000 mg | ORAL_TABLET | Freq: Every day | ORAL | 1 refills | Status: DC

## 2017-01-20 NOTE — Telephone Encounter (Signed)
Last OV 02/12/16 last filled by Dr.Walker 11/07/15 90 3rf

## 2017-01-20 NOTE — Telephone Encounter (Signed)
Patient has not been seen in close to a year. He needs to be set up for follow-up. Refills sent to pharmacy. Thanks.

## 2017-01-21 DIAGNOSIS — I272 Pulmonary hypertension, unspecified: Secondary | ICD-10-CM | POA: Diagnosis not present

## 2017-01-21 DIAGNOSIS — I69320 Aphasia following cerebral infarction: Secondary | ICD-10-CM | POA: Diagnosis not present

## 2017-01-21 DIAGNOSIS — I2510 Atherosclerotic heart disease of native coronary artery without angina pectoris: Secondary | ICD-10-CM | POA: Diagnosis not present

## 2017-01-21 DIAGNOSIS — I69351 Hemiplegia and hemiparesis following cerebral infarction affecting right dominant side: Secondary | ICD-10-CM | POA: Diagnosis not present

## 2017-01-21 DIAGNOSIS — I10 Essential (primary) hypertension: Secondary | ICD-10-CM | POA: Diagnosis not present

## 2017-01-21 DIAGNOSIS — I480 Paroxysmal atrial fibrillation: Secondary | ICD-10-CM | POA: Diagnosis not present

## 2017-01-22 ENCOUNTER — Other Ambulatory Visit: Payer: Self-pay

## 2017-01-22 MED ORDER — FLUOXETINE HCL 20 MG PO TABS
20.0000 mg | ORAL_TABLET | Freq: Every day | ORAL | 3 refills | Status: DC
Start: 2017-01-22 — End: 2017-02-18

## 2017-01-22 MED ORDER — ALLOPURINOL 100 MG PO TABS: 100.0000 mg | ORAL_TABLET | Freq: Every day | ORAL | 0 refills | Status: DC

## 2017-01-22 NOTE — Telephone Encounter (Signed)
Refill will be sent to pharmacy, though patient has not been seen in over a year and will need to keep his visit in December.

## 2017-01-22 NOTE — Telephone Encounter (Signed)
Patient also request a refill on prozac which he was given by [facility], he states [facility] informed [facility] he will need to start receiving this from his pcp. Called [facility] and they stated he is on prozac 20 mg last filled 12/11/16 30 0rf from Dr[facility]Brayo at [facility]. Last filled allopurinol by Dr.Walker 11/07/15 Patient is scheduled to establish care in december

## 2017-01-22 NOTE — Telephone Encounter (Signed)
Patient scheduled.

## 2017-01-22 NOTE — Telephone Encounter (Signed)
Patient notified

## 2017-01-29 ENCOUNTER — Other Ambulatory Visit: Payer: Self-pay | Admitting: Family Medicine

## 2017-01-29 DIAGNOSIS — Z23 Encounter for immunization: Secondary | ICD-10-CM | POA: Diagnosis not present

## 2017-02-05 ENCOUNTER — Telehealth: Payer: Self-pay

## 2017-02-05 NOTE — Telephone Encounter (Signed)
[facility] is requesting refill on gabapentin previously prescribed by [facility] DR.Brayo Gabapentin 100 mg Take 2 capsules by mouth every 8 hours #180

## 2017-02-08 NOTE — Telephone Encounter (Signed)
Please contact the patient to see what he takes this for. It does not appear that we have any information regarding him taking gabapentin and I'm unable to find where it has been prescribed through [facility]. Thanks.

## 2017-02-10 DIAGNOSIS — I4430 Unspecified atrioventricular block: Secondary | ICD-10-CM | POA: Diagnosis not present

## 2017-02-10 DIAGNOSIS — I480 Paroxysmal atrial fibrillation: Secondary | ICD-10-CM | POA: Diagnosis not present

## 2017-02-10 DIAGNOSIS — Z950 Presence of cardiac pacemaker: Secondary | ICD-10-CM | POA: Diagnosis not present

## 2017-02-10 NOTE — Telephone Encounter (Signed)
Tried calling, no vm 

## 2017-02-18 MED ORDER — FLUOXETINE HCL 20 MG PO TABS: 20.0000 mg | ORAL_TABLET | Freq: Every day | ORAL | 3 refills | Status: DC

## 2017-02-18 MED ORDER — GABAPENTIN 100 MG PO CAPS: 200.0000 mg | ORAL_CAPSULE | Freq: Two times a day (BID) | ORAL | 3 refills | Status: DC

## 2017-02-18 NOTE — Telephone Encounter (Signed)
Patient gave verbal to speak with wife. Patients wife states the neurologist has him taking this 2 100 mg capsules twice daily for nerve pain and headaches.  Patient would like 90 day refills. prozac Nitrostat isosorbide  Metoprolol Gabapentin eliquis Crestor [facility] in [place]

## 2017-02-18 NOTE — Addendum Note (Signed)
Addended by: Birdie SonsSONNENBERG, Noal Abshier G on: 02/18/2017 01:25 PM   Modules accepted: Orders

## 2017-02-18 NOTE — Telephone Encounter (Signed)
Crestor and gabapentin sent to pharmacy.  Please contact the patient's pharmacy to confirm doses of these medications.  We do not have most of them in our system.  Thanks.

## 2017-02-19 NOTE — Telephone Encounter (Signed)
Per patients pharmacy Fluoxetine 20 mg QD Isosorbide ER 30 mg QD Metoprolol ER 25 mg QD Eliquis 5 mg 1 Tablet every 12 hours

## 2017-02-20 MED ORDER — ISOSORBIDE MONONITRATE ER 30 MG PO TB24: 30.0000 mg | ORAL_TABLET | Freq: Every day | ORAL | 3 refills | Status: DC

## 2017-02-20 MED ORDER — METOPROLOL SUCCINATE ER 25 MG PO TB24: 25.0000 mg | ORAL_TABLET | Freq: Every day | ORAL | 3 refills | Status: DC

## 2017-02-20 NOTE — Telephone Encounter (Signed)
Tried to reach patient no answer voicemail is full.

## 2017-02-20 NOTE — Addendum Note (Signed)
Addended by: Glori LuisSONNENBERG, Nissa Stannard G on: 02/20/2017 03:45 PM   Modules accepted: Orders

## 2017-02-20 NOTE — Telephone Encounter (Signed)
Please contact the patient and find out when he was placed on Eliquis.  It appears based on our records and [facility]'s records that he was on Xarelto.  I would like confirmation of when he was switched or if he is ever been on Xarelto.  Thanks.

## 2017-02-21 ENCOUNTER — Encounter: Payer: Self-pay | Admitting: *Deleted

## 2017-02-21 NOTE — Telephone Encounter (Signed)
Tried to reach patient by phone no answer and  Voicemail is full, sent my chart message with medication questions.

## 2017-02-21 NOTE — Telephone Encounter (Signed)
Tried calling, vm full.

## 2017-02-21 NOTE — Telephone Encounter (Signed)
Please try to contact the patient or his wife again.  Thanks.

## 2017-02-25 NOTE — Telephone Encounter (Signed)
Sent letter unable to reach patient

## 2017-03-17 ENCOUNTER — Ambulatory Visit (INDEPENDENT_AMBULATORY_CARE_PROVIDER_SITE_OTHER): Payer: Medicare Other | Admitting: Family Medicine

## 2017-03-17 ENCOUNTER — Encounter: Payer: Self-pay | Admitting: Family Medicine

## 2017-03-17 ENCOUNTER — Other Ambulatory Visit: Payer: Self-pay

## 2017-03-17 VITALS — BP 134/90 | HR 70 | Temp 97.6°F | Wt 197.2 lb

## 2017-03-17 DIAGNOSIS — I10 Essential (primary) hypertension: Secondary | ICD-10-CM | POA: Diagnosis not present

## 2017-03-17 DIAGNOSIS — I480 Paroxysmal atrial fibrillation: Secondary | ICD-10-CM | POA: Diagnosis not present

## 2017-03-17 DIAGNOSIS — I639 Cerebral infarction, unspecified: Secondary | ICD-10-CM | POA: Diagnosis not present

## 2017-03-17 DIAGNOSIS — F419 Anxiety disorder, unspecified: Secondary | ICD-10-CM | POA: Diagnosis not present

## 2017-03-17 MED ORDER — APIXABAN 5 MG PO TABS: 5.0000 mg | ORAL_TABLET | Freq: Two times a day (BID) | ORAL | 3 refills | Status: DC

## 2017-03-17 MED ORDER — HYDROCHLOROTHIAZIDE 12.5 MG PO CAPS: 12.5000 mg | ORAL_CAPSULE | Freq: Every day | ORAL | 3 refills | Status: DC

## 2017-03-17 NOTE — Patient Instructions (Signed)
Nice to see you. I refilled your medications. Please monitor your blood pressure at home.  Please continue your current regimen and continue to follow with If you develop any new symptoms please be reevaluated.

## 2017-03-18 DIAGNOSIS — F419 Anxiety disorder, unspecified: Secondary | ICD-10-CM | POA: Insufficient documentation

## 2017-03-18 DIAGNOSIS — F39 Unspecified mood [affective] disorder: Secondary | ICD-10-CM | POA: Insufficient documentation

## 2017-03-18 DIAGNOSIS — Z8673 Personal history of transient ischemic attack (TIA), and cerebral infarction without residual deficits: Secondary | ICD-10-CM | POA: Insufficient documentation

## 2017-03-18 HISTORY — DX: Personal history of transient ischemic attack (TIA), and cerebral infarction without residual deficits: Z86.73

## 2017-03-18 NOTE — Progress Notes (Signed)
Blake AlarEric Muriel Hannold, MD Phone: [phone]  Tennis MustJoseph Terrence Fransico Osborne is a 76 y.o. male who presents today for follow-up.  Hypertension: Not checking at home.  Taking medication.  No chest pain or shortness of breath.  A. fib: No palpitations.  He did have a stroke because he was not appropriately taking his Xarelto.  He is currently on Eliquis.  Stroke resulted in right upper outer eye visual field defect.  Some aphasia that is expressive.  Otherwise doing quite well.  He appears to have had a ischemic stroke followed by hemorrhagic transformation due to taking aspirin and his Xarelto after having a stroke.  Taking Crestor.  Does note some anxiety for which the Prozac is helpful.  Social History   Tobacco Use  Smoking Status Never Smoker  Smokeless Tobacco Never Used     ROS see history of present illness  Objective  Physical Exam Vitals:   03/17/17 1009 03/17/17 1040  BP: (!) 142/92 134/90  Pulse: 70   Temp: 97.6 F (36.4 C)   SpO2: 97%     BP Readings from Last 3 Encounters:  03/17/17 134/90  10/15/16 138/78  10/07/16 128/64   Wt Readings from Last 3 Encounters:  03/17/17 197 lb 3.2 oz (89.4 kg)  10/15/16 218 lb 9.6 oz (99.2 kg)  10/07/16 216 lb 6.4 oz (98.2 kg)    Physical Exam  Constitutional: No distress.  Cardiovascular: Normal rate, regular rhythm and normal heart sounds.  Pulmonary/Chest: Effort normal and breath sounds normal.  Musculoskeletal: He exhibits no edema.  Neurological: He is alert.  Absent vision in right eye upper outer quadrant visual field otherwise CN 2-12 intact, some expressive aphasia, 5/5 strength in bilateral biceps, triceps, grip, quads, hamstrings, plantar and dorsiflexion, sensation to light touch intact in bilateral UE and LE, normal gait  Skin: He is not diaphoretic.     Assessment/Plan: Please see individual problem list.  Paroxysmal atrial fibrillation (HCC) Continue Eliquis.  Discussed the importance of taking  this.  Hypertension Improved on recheck.  He will monitor at home.  Continue current regimen.  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  Still with a visual field deficit.  Some expressive aphasia as well.  Overall doing quite well.  Continue Eliquis for stroke prevention.  Continue Lipitor.  He will continue to follow with neurology and cardiology.  Given return precautions.  Anxiety Continue Prozac.   Blake Osborne was seen today for follow-up.  Diagnoses and all orders for this visit:  Essential hypertension -     Cancel: Basic Metabolic Panel (BMET) -     Basic Metabolic Panel (BMET); Future  Paroxysmal atrial fibrillation (HCC)  Cerebrovascular accident (CVA), unspecified mechanism (HCC)  Anxiety  Other orders -     hydrochlorothiazide (MICROZIDE) 12.5 MG capsule; Take 1 capsule (12.5 mg total) by mouth daily. -     apixaban (ELIQUIS) 5 MG TABS tablet; Take 1 tablet (5 mg total) by mouth 2 (two) times daily.    Orders Placed This Encounter  Procedures  . Basic Metabolic Panel (BMET)    Standing Status:   Future    Standing Expiration Date:   03/17/2018    Meds ordered this encounter  Medications  . hydrochlorothiazide (MICROZIDE) 12.5 MG capsule    Sig: Take 1 capsule (12.5 mg total) by mouth daily.    Dispense:  90 capsule    Refill:  3  . apixaban (ELIQUIS) 5 MG TABS tablet    Sig: Take 1 tablet (5 mg  total) by mouth 2 (two) times daily.    Dispense:  180 tablet    Refill:  3     Blake AlarEric Ferguson Gertner, MD [facility] [place]

## 2017-03-18 NOTE — Assessment & Plan Note (Signed)
Ischemic with hemorrhagic transformation.  Still with a visual field deficit.  Some expressive aphasia as well.  Overall doing quite well.  Continue Eliquis for stroke prevention.  Continue Lipitor.  He will continue to follow with neurology and cardiology.  Given return precautions.

## 2017-03-18 NOTE — Assessment & Plan Note (Signed)
Improved on recheck.  He will monitor at home.  Continue current regimen.

## 2017-03-18 NOTE — Assessment & Plan Note (Signed)
>>  ASSESSMENT AND PLAN FOR ANXIETY WRITTEN ON 03/18/2017  5:52 PM BY SONNENBERG, ERIC G, MD  Continue Prozac .

## 2017-03-18 NOTE — Assessment & Plan Note (Addendum)
Continue Prozac

## 2017-03-18 NOTE — Assessment & Plan Note (Signed)
Continue Eliquis.  Discussed the importance of taking this.

## 2017-04-15 DIAGNOSIS — I69320 Aphasia following cerebral infarction: Secondary | ICD-10-CM | POA: Diagnosis not present

## 2017-04-15 DIAGNOSIS — J811 Chronic pulmonary edema: Secondary | ICD-10-CM | POA: Diagnosis not present

## 2017-04-15 DIAGNOSIS — Z950 Presence of cardiac pacemaker: Secondary | ICD-10-CM | POA: Diagnosis not present

## 2017-04-15 DIAGNOSIS — I9589 Other hypotension: Secondary | ICD-10-CM | POA: Diagnosis not present

## 2017-04-15 DIAGNOSIS — E871 Hypo-osmolality and hyponatremia: Secondary | ICD-10-CM | POA: Diagnosis not present

## 2017-04-15 DIAGNOSIS — I2510 Atherosclerotic heart disease of native coronary artery without angina pectoris: Secondary | ICD-10-CM | POA: Diagnosis not present

## 2017-04-15 DIAGNOSIS — I480 Paroxysmal atrial fibrillation: Secondary | ICD-10-CM | POA: Diagnosis not present

## 2017-04-15 DIAGNOSIS — Z955 Presence of coronary angioplasty implant and graft: Secondary | ICD-10-CM | POA: Diagnosis not present

## 2017-04-15 DIAGNOSIS — J9602 Acute respiratory failure with hypercapnia: Secondary | ICD-10-CM | POA: Diagnosis not present

## 2017-04-15 DIAGNOSIS — Z79899 Other long term (current) drug therapy: Secondary | ICD-10-CM | POA: Diagnosis not present

## 2017-04-15 DIAGNOSIS — G4733 Obstructive sleep apnea (adult) (pediatric): Secondary | ICD-10-CM | POA: Diagnosis present

## 2017-04-15 DIAGNOSIS — Z7901 Long term (current) use of anticoagulants: Secondary | ICD-10-CM | POA: Diagnosis not present

## 2017-04-15 DIAGNOSIS — I10 Essential (primary) hypertension: Secondary | ICD-10-CM | POA: Diagnosis present

## 2017-04-15 DIAGNOSIS — Z951 Presence of aortocoronary bypass graft: Secondary | ICD-10-CM | POA: Diagnosis not present

## 2017-04-15 DIAGNOSIS — N179 Acute kidney failure, unspecified: Secondary | ICD-10-CM | POA: Diagnosis not present

## 2017-04-15 DIAGNOSIS — J9601 Acute respiratory failure with hypoxia: Secondary | ICD-10-CM | POA: Diagnosis not present

## 2017-04-15 DIAGNOSIS — H53461 Homonymous bilateral field defects, right side: Secondary | ICD-10-CM | POA: Diagnosis present

## 2017-04-15 DIAGNOSIS — I517 Cardiomegaly: Secondary | ICD-10-CM | POA: Diagnosis not present

## 2017-04-15 DIAGNOSIS — J9692 Respiratory failure, unspecified with hypercapnia: Secondary | ICD-10-CM | POA: Diagnosis not present

## 2017-04-15 DIAGNOSIS — R4182 Altered mental status, unspecified: Secondary | ICD-10-CM | POA: Diagnosis not present

## 2017-04-15 DIAGNOSIS — J9691 Respiratory failure, unspecified with hypoxia: Secondary | ICD-10-CM | POA: Diagnosis not present

## 2017-04-15 DIAGNOSIS — R569 Unspecified convulsions: Secondary | ICD-10-CM | POA: Diagnosis not present

## 2017-04-15 DIAGNOSIS — I69398 Other sequelae of cerebral infarction: Secondary | ICD-10-CM | POA: Diagnosis not present

## 2017-04-15 DIAGNOSIS — E785 Hyperlipidemia, unspecified: Secondary | ICD-10-CM | POA: Diagnosis present

## 2017-04-15 DIAGNOSIS — Z87891 Personal history of nicotine dependence: Secondary | ICD-10-CM | POA: Diagnosis not present

## 2017-04-15 DIAGNOSIS — R918 Other nonspecific abnormal finding of lung field: Secondary | ICD-10-CM | POA: Diagnosis not present

## 2017-04-18 ENCOUNTER — Telehealth: Payer: Self-pay | Admitting: Family Medicine

## 2017-04-18 MED ORDER — GABAPENTIN 100 MG PO CAPS
200.00 | ORAL_CAPSULE | ORAL | Status: DC
Start: 2017-04-17 — End: 2017-04-18

## 2017-04-18 MED ORDER — ALLOPURINOL 100 MG PO TABS
100.00 | ORAL_TABLET | ORAL | Status: DC
Start: 2017-04-18 — End: 2017-04-18

## 2017-04-18 MED ORDER — ACETAMINOPHEN 325 MG PO TABS
650.00 | ORAL_TABLET | ORAL | Status: DC
Start: ? — End: 2017-04-18

## 2017-04-18 MED ORDER — HYDRALAZINE HCL 20 MG/ML IJ SOLN
10.00 | INTRAMUSCULAR | Status: DC
Start: ? — End: 2017-04-18

## 2017-04-18 MED ORDER — LABETALOL HCL 5 MG/ML IV SOLN
10.00 | INTRAVENOUS | Status: DC
Start: ? — End: 2017-04-18

## 2017-04-18 MED ORDER — GENERIC EXTERNAL MEDICATION
Status: DC
Start: ? — End: 2017-04-18

## 2017-04-18 MED ORDER — SENNOSIDES-DOCUSATE SODIUM 8.6-50 MG PO TABS
2.00 | ORAL_TABLET | ORAL | Status: DC
Start: 2017-04-17 — End: 2017-04-18

## 2017-04-18 MED ORDER — LEVETIRACETAM 500 MG PO TABS
1000.00 | ORAL_TABLET | ORAL | Status: DC
Start: 2017-04-17 — End: 2017-04-18

## 2017-04-18 MED ORDER — METOPROLOL SUCCINATE ER 25 MG PO TB24
25.00 | ORAL_TABLET | ORAL | Status: DC
Start: ? — End: 2017-04-18

## 2017-04-18 MED ORDER — GLUCAGON HCL RDNA (DIAGNOSTIC) 1 MG IJ SOLR
1.00 | INTRAMUSCULAR | Status: DC
Start: ? — End: 2017-04-18

## 2017-04-18 MED ORDER — ROSUVASTATIN CALCIUM 20 MG PO TABS
40.00 | ORAL_TABLET | ORAL | Status: DC
Start: 2017-04-17 — End: 2017-04-18

## 2017-04-18 MED ORDER — DEXTROSE 50 % IV SOLN
12.50 | INTRAVENOUS | Status: DC
Start: ? — End: 2017-04-18

## 2017-04-18 MED ORDER — POLYVINYL ALCOHOL 1.4 % OP SOLN
1.00 | OPHTHALMIC | Status: DC
Start: ? — End: 2017-04-18

## 2017-04-18 MED ORDER — APIXABAN 5 MG PO TABS
5.00 | ORAL_TABLET | ORAL | Status: DC
Start: 2017-04-17 — End: 2017-04-18

## 2017-04-18 MED ORDER — ISOSORBIDE MONONITRATE ER 30 MG PO TB24
30.00 | ORAL_TABLET | ORAL | Status: DC
Start: 2017-04-18 — End: 2017-04-18

## 2017-04-18 MED ORDER — FLUOXETINE HCL 20 MG PO CAPS
20.00 | ORAL_CAPSULE | ORAL | Status: DC
Start: 2017-04-18 — End: 2017-04-18

## 2017-04-18 NOTE — Telephone Encounter (Signed)
Copied from CRM [phone]. Topic: Inquiry >> Apr 18, 2017  9:18 AM Anice PaganiniMunoz, Hattye Siegfried I, NT wrote: Reason for CRM: pt wife call and said her husband was in [facility] ICU on 04/16/17 and hi need to see Doctor Birdie SonsSonnenberg a soon it Possible for his new Med

## 2017-04-18 NOTE — Telephone Encounter (Signed)
Please advise 

## 2017-04-18 NOTE — Telephone Encounter (Signed)
Transition Care Management Follow-up Telephone Call  How have you been since you were released from the hospital? Patient taking Keppra 1000 mg twice a day and is staying very tired. Really tired but not seen any signs of any seizures.   Do you understand why you were in the hospital? yes   Do you understand the discharge instrcutions? yes  Items Reviewed:  Medications reviewed: yes  Allergies reviewed: yes  Dietary changes reviewed: yes  Referrals reviewed: yes   Functional Questionnaire:   Activities of Daily Living (ADLs):   He states they are independent in the following: ambulation, bathing and hygiene, feeding, continence, grooming, toileting and dressing States they require assistance with the following: Need s assistance only due to dizziness, uses cane.   Any transportation issues/concerns?: no   Any patient concerns? Yes, patient wife has noticed since starting the Keppra patient angers easily.   Confirmed importance and date/time of follow-up visits scheduled: yes   Confirmed with patient if condition begins to worsen call PCP or go to the ER.  Patient was given the Call-a-Nurse line [phone]: yes

## 2017-04-21 ENCOUNTER — Ambulatory Visit (INDEPENDENT_AMBULATORY_CARE_PROVIDER_SITE_OTHER): Payer: Medicare Other | Admitting: Family Medicine

## 2017-04-21 ENCOUNTER — Encounter: Payer: Self-pay | Admitting: Family Medicine

## 2017-04-21 DIAGNOSIS — G40909 Epilepsy, unspecified, not intractable, without status epilepticus: Secondary | ICD-10-CM | POA: Diagnosis not present

## 2017-04-21 NOTE — Patient Instructions (Signed)
Nice to see you. We are going to try and get you into see your neurologist for follow-up sooner than April. If you have recurrent seizure activity please be evaluated.

## 2017-04-21 NOTE — Progress Notes (Signed)
  Blake AlarEric Servando Kyllonen, MD Phone: [phone]  Tennis MustJoseph Terrence Fransico Osborne is a 77 y.o. male who presents today for hospital follow-up.  Patient was hospitalized from 04/15/17-04/17/17 for seizure activity.  This was felt to be related to his prior stroke.  He had a 2-minute seizure while at home.  He saw yellow and orange lights and then started to shake and lost consciousness.  He then bit his tongue.  He did have urinary incontinence.  He had a second seizure at the hospital and his wife reports he crashed and was intubated.  He was placed on Keppra twice daily and has not had any seizures since then.  He notes he has had some stable vision changes with seeing intermittent colors, black, and white since having the stroke.  Since going on the Keppra he has had no vision changes.  He does get upset more easily.  He notes no depression or anxiety.  He notes his balance has been off since the seizure to some degree.  He has been on Prozac chronically.  Overall improved.  He has an appointment with his neurologist though it is not until April.  Discharge summary reviewed.  Medications reviewed.  Social History   Tobacco Use  Smoking Status Never Smoker  Smokeless Tobacco Never Used     ROS see history of present illness  Objective  Physical Exam Vitals:   04/21/17 1142  BP: 140/90  Pulse: 70  Temp: (!) 97.4 F (36.3 C)  SpO2: 96%    BP Readings from Last 3 Encounters:  04/21/17 140/90  03/17/17 134/90  10/15/16 138/78   Wt Readings from Last 3 Encounters:  04/21/17 198 lb (89.8 kg)  03/17/17 197 lb 3.2 oz (89.4 kg)  10/15/16 218 lb 9.6 oz (99.2 kg)    Physical Exam  Constitutional: No distress.  Cardiovascular: Normal rate, regular rhythm and normal heart sounds.  Pulmonary/Chest: Effort normal and breath sounds normal.  Musculoskeletal: He exhibits no edema.  Neurological: He is alert.  CN 2-12 intact, 5/5 strength in bilateral biceps, triceps, grip, quads, hamstrings, plantar and  dorsiflexion, sensation to light touch intact in bilateral UE and LE, mild expressive aphasia, appears minimally off balance with gait  Skin: Skin is warm and dry. He is not diaphoretic.     Assessment/Plan: Please see individual problem list.  Seizure disorder [facility](HCC) Patient with onset of seizures likely related to prior stroke.  He has done well since being placed on Keppra.  No recurrent seizure activity.  Does note some agitation though this is mostly related to him not being able to do what he used to do.  No depression or anxiety.  The patient's wife asked whether or not it would be a good idea to increase his Prozac and I advised not to do this at this time.  We will get him in to see his neurologist sooner than April if possible.  He will continue Keppra.  He will not drive.  He is given return precautions.   Blake Osborne was seen today for hospitalization follow-up.  Diagnoses and all orders for this visit:  Seizure disorder (HCC)    No orders of the defined types were placed in this encounter.   No orders of the defined types were placed in this encounter.    Blake AlarEric Didier Brandenburg, MD [facility] [place]

## 2017-04-26 DIAGNOSIS — G40909 Epilepsy, unspecified, not intractable, without status epilepticus: Secondary | ICD-10-CM | POA: Insufficient documentation

## 2017-04-26 NOTE — Assessment & Plan Note (Signed)
Patient with onset of seizures likely related to prior stroke.  He has done well since being placed on Keppra.  No recurrent seizure activity.  Does note some agitation though this is mostly related to him not being able to do what he used to do.  No depression or anxiety.  The patient's wife asked whether or not it would be a good idea to increase his Prozac and I advised not to do this at this time.  We will get him in to see his neurologist sooner than April if possible.  He will continue Keppra.  He will not drive.  He is given return precautions.

## 2017-04-28 ENCOUNTER — Other Ambulatory Visit: Payer: Self-pay | Admitting: Family Medicine

## 2017-06-18 ENCOUNTER — Encounter: Payer: Self-pay | Admitting: Family Medicine

## 2017-06-18 ENCOUNTER — Ambulatory Visit (INDEPENDENT_AMBULATORY_CARE_PROVIDER_SITE_OTHER): Payer: Medicare Other | Admitting: Family Medicine

## 2017-06-18 ENCOUNTER — Other Ambulatory Visit: Payer: Self-pay

## 2017-06-18 VITALS — BP 118/76 | HR 73 | Temp 97.5°F | Ht 72.0 in | Wt 199.2 lb

## 2017-06-18 DIAGNOSIS — R42 Dizziness and giddiness: Secondary | ICD-10-CM

## 2017-06-18 DIAGNOSIS — I480 Paroxysmal atrial fibrillation: Secondary | ICD-10-CM | POA: Diagnosis not present

## 2017-06-18 DIAGNOSIS — R809 Proteinuria, unspecified: Secondary | ICD-10-CM

## 2017-06-18 DIAGNOSIS — L309 Dermatitis, unspecified: Secondary | ICD-10-CM | POA: Diagnosis not present

## 2017-06-18 DIAGNOSIS — G40909 Epilepsy, unspecified, not intractable, without status epilepticus: Secondary | ICD-10-CM | POA: Diagnosis not present

## 2017-06-18 DIAGNOSIS — M109 Gout, unspecified: Secondary | ICD-10-CM | POA: Diagnosis not present

## 2017-06-18 DIAGNOSIS — D649 Anemia, unspecified: Secondary | ICD-10-CM | POA: Diagnosis not present

## 2017-06-18 HISTORY — DX: Proteinuria, unspecified: R80.9

## 2017-06-18 HISTORY — DX: Dizziness and giddiness: R42

## 2017-06-18 LAB — POCT URINALYSIS DIPSTICK
BILIRUBIN UA: NEGATIVE
GLUCOSE UA: NEGATIVE
KETONES UA: NEGATIVE
Leukocytes, UA: NEGATIVE
Nitrite, UA: NEGATIVE
Protein, UA: 100
RBC UA: NEGATIVE
SPEC GRAV UA: 1.015 (ref 1.010–1.025)
UROBILINOGEN UA: 0.2 U/dL
pH, UA: 5 (ref 5.0–8.0)

## 2017-06-18 LAB — URIC ACID: Uric Acid, Serum: 6.3 mg/dL (ref 4.0–7.8)

## 2017-06-18 LAB — CBC
HEMATOCRIT: 44.8 % (ref 39.0–52.0)
Hemoglobin: 15.2 g/dL (ref 13.0–17.0)
MCHC: 33.9 g/dL (ref 30.0–36.0)
MCV: 95.4 fl (ref 78.0–100.0)
Platelets: 122 10*3/uL — ABNORMAL LOW (ref 150.0–400.0)
RBC: 4.7 Mil/uL (ref 4.22–5.81)
RDW: 13.4 % (ref 11.5–15.5)
WBC: 8 10*3/uL (ref 4.0–10.5)

## 2017-06-18 LAB — URINALYSIS, MICROSCOPIC ONLY

## 2017-06-18 MED ORDER — LEVETIRACETAM 1000 MG PO TABS: 1000.0000 mg | ORAL_TABLET | Freq: Two times a day (BID) | ORAL | 0 refills | Status: DC

## 2017-06-18 MED ORDER — FLUOCINONIDE-E 0.05 % EX CREA: 1.0000 "application " | TOPICAL_CREAM | Freq: Two times a day (BID) | CUTANEOUS | 2 refills | Status: DC

## 2017-06-18 NOTE — Assessment & Plan Note (Signed)
Rare.  Only on 2 occasions.  Resolved with sitting down.  Otherwise asymptomatic.  Has not recurred in the last 2 weeks.  Orthostatics negative.  Possibly medication related.  Encouraged hydration.  He will follow-up with cardiology and neurology.  Given return precautions.

## 2017-06-18 NOTE — Addendum Note (Signed)
Addended by: Warden FillersWRIGHT, Sariyah Corcino S on: 06/18/2017 02:18 PM   Modules accepted: Orders

## 2017-06-18 NOTE — Assessment & Plan Note (Signed)
Noted on prior lab work.  Plan to recheck.

## 2017-06-18 NOTE — Assessment & Plan Note (Addendum)
Sinus rhythm today.  Remains on Eliquis.  No palpitations.  He will continue to follow with cardiology.

## 2017-06-18 NOTE — Assessment & Plan Note (Signed)
Continue as needed Lidex.  Refills given.

## 2017-06-18 NOTE — Assessment & Plan Note (Signed)
No recent flares.  Continue allopurinol.  Check uric acid.

## 2017-06-18 NOTE — Assessment & Plan Note (Signed)
Noted on prior UA.  Will recheck.

## 2017-06-18 NOTE — Patient Instructions (Addendum)
Nice to see you. Please see the neurologist as planned. Please contact your cardiologist for follow-up as well. We will check some lab work and urine studies today and contact you with the results.

## 2017-06-18 NOTE — Progress Notes (Signed)
Blake Blake Osborne Sharyah Bostwick, MD Phone: [phone]  Tennis MustJoseph Terrence Fransico Osborne is a 77 y.o. male who presents today for f/u.  Seizures: Patient has had no recurrence since going on Keppra.  He notes his vision issues from previously improved as well.  He has not see neuro yet.  A. fib: He notes no palpitations.  No chest pain or shortness of breath.  He remains on Eliquis and metoprolol.  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  He is going to contact cardiology to set up an appointment.  He continues on Crestor.  He reports history of gout.  He remains on allopurinol.  It has been years since he had a gout attack.  No recent uric acid.  He has occasional dry itchy skin on his hands and lower legs.  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  Uses Lidex for this with good benefit.  Social History   Tobacco Use  Smoking Status Never Smoker  Smokeless Tobacco Never Used     ROS see history of present illness  Objective  Physical Exam Vitals:   06/18/17 1000  BP: 118/76  Pulse: 73  Temp: (!) 97.5 F (36.4 C)  SpO2: 98%   Laying blood pressure 124/72 pulse 70 Sitting blood pressure 120/68 pulse 70 Standing blood pressure 126/70 pulse 70  BP Readings from Last 3 Encounters:  06/18/17 118/76  04/21/17 140/90  03/17/17 134/90   Wt Readings from Last 3 Encounters:  06/18/17 199 lb 3.2 oz (90.4 kg)  04/21/17 198 lb (89.8 kg)  03/17/17 197 lb 3.2 oz (89.4 kg)    Physical Exam  Constitutional: No distress.  Cardiovascular: Normal rate, regular rhythm and normal heart sounds.  Pulmonary/Chest: Effort normal and breath sounds normal.  Musculoskeletal: He exhibits no edema.  Neurological: He is alert. Gait normal.  Skin: Skin is warm and dry. He is not diaphoretic.  Dry skin on lower legs     Assessment/Plan: Please see individual problem list.  Paroxysmal atrial fibrillation (HCC) Sinus rhythm today.   Remains on Eliquis.  No palpitations.  He will continue to follow with cardiology.  Seizure disorder (HCC) Has done well on Keppra.  Refill given to cover until he sees neurology.  I reinforce that neurology would need to prescribe this in the future.  Dermatitis Continue as needed Lidex.  Refills given.  Gout No recent flares.  Continue allopurinol.  Check uric acid.  Lightheadedness Rare.  Only on 2 occasions.  Resolved with sitting down.  Otherwise asymptomatic.  Has not recurred in the last 2 weeks.  Orthostatics negative.  Possibly medication related.  Encouraged hydration.  He will follow-up with cardiology and neurology.  Given return precautions.  Proteinuria Noted on prior UA.  Will recheck.  Anemia Noted on prior lab work.  Plan to recheck.   Orders Placed This Encounter  Procedures  . CBC  . Uric acid  . POCT Urinalysis Dipstick    Meds ordered this encounter  Medications  . levETIRAcetam (KEPPRA) 1000 MG tablet    Sig: Take 1 tablet (1,000 mg total) by mouth 2 (two) times daily.    Dispense:  180 tablet    Refill:  0  . fluocinonide-emollient (LIDEX-E) 0.05 % cream    Sig: Apply 1 application topically 2 (two) times daily.    Dispense:  60 g    Refill:  2     Blake Blake Osborne Blake Moors, MD  [place]

## 2017-06-18 NOTE — Assessment & Plan Note (Signed)
Has done well on Keppra.  Refill given to cover until he sees neurology.  I reinforce that neurology would need to prescribe this in the future.

## 2017-06-21 ENCOUNTER — Other Ambulatory Visit: Payer: Self-pay | Admitting: Family Medicine

## 2017-06-21 DIAGNOSIS — R801 Persistent proteinuria, unspecified: Secondary | ICD-10-CM

## 2017-06-24 ENCOUNTER — Other Ambulatory Visit: Payer: Medicare Other

## 2017-06-30 ENCOUNTER — Ambulatory Visit
Admission: RE | Admit: 2017-06-30 | Discharge: 2017-06-30 | Disposition: A | Discharge status: Home / Self Care | Payer: Medicare Other | Source: Ambulatory Visit | Attending: Family Medicine | Admitting: Family Medicine

## 2017-06-30 DIAGNOSIS — N281 Cyst of kidney, acquired: Secondary | ICD-10-CM | POA: Diagnosis not present

## 2017-06-30 DIAGNOSIS — R801 Persistent proteinuria, unspecified: Secondary | ICD-10-CM | POA: Insufficient documentation

## 2017-07-01 ENCOUNTER — Telehealth: Payer: Self-pay | Admitting: Family Medicine

## 2017-07-01 NOTE — Telephone Encounter (Signed)
Copied from CRM [phone]. Topic: Quick Communication - Lab Results >> Jul 01, 2017 11:12 AM Inetta FermoHendricks, Jessica S, CMA wrote: Left message to return call, ok for [facility] to give results and speak to patient    Pt returning call about lab work.

## 2017-07-05 ENCOUNTER — Other Ambulatory Visit: Payer: Self-pay | Admitting: Family Medicine

## 2017-07-09 ENCOUNTER — Other Ambulatory Visit: Payer: Medicare Other

## 2017-07-09 DIAGNOSIS — R801 Persistent proteinuria, unspecified: Secondary | ICD-10-CM | POA: Diagnosis not present

## 2017-07-10 LAB — UPEP/TP, 24-HR URINE
ALBUMIN, U: 72.4 %
ALPHA 1 UR: 2.3 %
Alpha 2, Urine: 3 %
Beta, Urine: 11.3 %
Gamma Globulin, Urine: 11 %
PROTEIN 24H UR: 1379 mg/(24.h) — AB (ref 30–150)
PROTEIN UR: 70.7 mg/dL

## 2017-07-16 ENCOUNTER — Other Ambulatory Visit: Payer: Self-pay | Admitting: Family Medicine

## 2017-07-16 DIAGNOSIS — R809 Proteinuria, unspecified: Secondary | ICD-10-CM

## 2017-07-21 ENCOUNTER — Ambulatory Visit: Payer: Medicare Other | Admitting: Family Medicine

## 2017-07-21 DIAGNOSIS — I63532 Cerebral infarction due to unspecified occlusion or stenosis of left posterior cerebral artery: Secondary | ICD-10-CM | POA: Diagnosis not present

## 2017-07-21 DIAGNOSIS — Z7901 Long term (current) use of anticoagulants: Secondary | ICD-10-CM | POA: Diagnosis not present

## 2017-07-21 DIAGNOSIS — I480 Paroxysmal atrial fibrillation: Secondary | ICD-10-CM | POA: Diagnosis not present

## 2017-07-21 DIAGNOSIS — R569 Unspecified convulsions: Secondary | ICD-10-CM | POA: Diagnosis not present

## 2017-07-31 DIAGNOSIS — I63532 Cerebral infarction due to unspecified occlusion or stenosis of left posterior cerebral artery: Secondary | ICD-10-CM | POA: Diagnosis not present

## 2017-07-31 DIAGNOSIS — G40109 Localization-related (focal) (partial) symptomatic epilepsy and epileptic syndromes with simple partial seizures, not intractable, without status epilepticus: Secondary | ICD-10-CM | POA: Diagnosis not present

## 2017-08-20 ENCOUNTER — Ambulatory Visit (INDEPENDENT_AMBULATORY_CARE_PROVIDER_SITE_OTHER): Payer: Medicare Other | Admitting: Family Medicine

## 2017-08-20 ENCOUNTER — Other Ambulatory Visit: Payer: Self-pay

## 2017-08-20 ENCOUNTER — Encounter: Payer: Self-pay | Admitting: Family Medicine

## 2017-08-20 DIAGNOSIS — M7062 Trochanteric bursitis, left hip: Secondary | ICD-10-CM | POA: Diagnosis not present

## 2017-08-20 DIAGNOSIS — G40909 Epilepsy, unspecified, not intractable, without status epilepticus: Secondary | ICD-10-CM | POA: Diagnosis not present

## 2017-08-20 DIAGNOSIS — I480 Paroxysmal atrial fibrillation: Secondary | ICD-10-CM

## 2017-08-20 DIAGNOSIS — E785 Hyperlipidemia, unspecified: Secondary | ICD-10-CM | POA: Diagnosis not present

## 2017-08-20 DIAGNOSIS — I10 Essential (primary) hypertension: Secondary | ICD-10-CM

## 2017-08-20 DIAGNOSIS — Z8673 Personal history of transient ischemic attack (TIA), and cerebral infarction without residual deficits: Secondary | ICD-10-CM | POA: Diagnosis not present

## 2017-08-20 DIAGNOSIS — R809 Proteinuria, unspecified: Secondary | ICD-10-CM

## 2017-08-20 HISTORY — DX: Trochanteric bursitis, left hip: M70.62

## 2017-08-20 NOTE — Assessment & Plan Note (Signed)
He has an appointment with nephrology.  He will keep this.

## 2017-08-20 NOTE — Assessment & Plan Note (Signed)
Given exercises to complete.

## 2017-08-20 NOTE — Progress Notes (Signed)
  [name], MD Phone: [phone]  Blake Osborne is a 77 y.o. male who presents today for f/u.  Patient has done quite a bit better since being switched to extended release Keppra.  His wife notes he is a little nicer and he thinks he is nicer.  He has been more active.  He has not even had a hint of a seizure.  He has no new stroke symptoms.  No numbness or weakness.  He continues on his blood pressure medications.  No chest pain, shortness breath, or edema.  He continues on Crestor.  No myalgias.  He continues on allopurinol.  No gout symptoms.  He notes left lateral hip pain has been going on for about 6 months.  No injury.  Only aches when he lays on it in bed.  Will go onto his back and it will resolve.  They report they have been trying to contact his cardiologist regarding evaluation for his pacemaker.  They report they were advised about 7 or 8 months ago that he had about 7 months left on his pacemaker.  Social History   Tobacco Use  Smoking Status Never Smoker  Smokeless Tobacco Never Used     ROS see history of present illness  Objective  Physical Exam Vitals:   08/20/17 1341  BP: 112/66  Pulse: 70  Temp: 97.7 F (36.5 C)  SpO2: 96%    BP Readings from Last 3 Encounters:  08/20/17 112/66  06/18/17 118/76  04/21/17 140/90   Wt Readings from Last 3 Encounters:  08/20/17 202 lb 3.2 oz (91.7 kg)  06/18/17 199 lb 3.2 oz (90.4 kg)  04/21/17 198 lb (89.8 kg)    Physical Exam  Constitutional: No distress.  Cardiovascular: Normal rate, regular rhythm and normal heart sounds.  Pulmonary/Chest: Effort normal and breath sounds normal.  Musculoskeletal: He exhibits no edema.  No discomfort or on internal or external range of motion of either hip, slight tenderness over the left greater trochanter, full internal and external range of motion of both hips  Neurological: He is alert.  Skin: Skin is warm and dry. He is not diaphoretic.      Assessment/Plan: Please see individual problem list.  Proteinuria He has an appointment with nephrology.  He will keep this.  Seizure disorder (HCC) Seems to have improved quite a bit with change to extended release Keppra.  He will continue to follow with neurology.  History of stroke No new symptoms.  He will continue to follow with neurology.  Continue risk factor management.  Hypertension Well-controlled.  Continue current regimen.  Trochanteric bursitis of left hip Given exercises to complete.  Paroxysmal atrial fibrillation (HCC) Seems to be in sinus rhythm today.  He has been trying to get in touch with his cardiologist regarding pacemaker check though they have not gotten a call back.  I offered to have [place] call the cardiology office though the patient declined this.  We will fax our note to them.  I advised the patient to continue to call them and if they are not able to get through he should go to their office to try to get set up with an appointment.  Hyperlipidemia Cholesterol is been well controlled.  Continue Crestor.   No orders of the defined types were placed in this encounter.   No orders of the defined types were placed in this encounter.    [name], MD [facility]

## 2017-08-20 NOTE — Assessment & Plan Note (Signed)
Well-controlled.  Continue current regimen. 

## 2017-08-20 NOTE — Assessment & Plan Note (Signed)
Cholesterol is been well controlled.  Continue Crestor.

## 2017-08-20 NOTE — Assessment & Plan Note (Signed)
Seems to be in sinus rhythm today.  He has been trying to get in touch with his cardiologist regarding pacemaker check though they have not gotten a call back.  I offered to have [place] call the cardiology office though the patient declined this.  We will fax our note to them.  I advised the patient to continue to call them and if they are not able to get through he should go to their office to try to get set up with an appointment.

## 2017-08-20 NOTE — Assessment & Plan Note (Signed)
Seems to have improved quite a bit with change to extended release Keppra.  He will continue to follow with neurology.

## 2017-08-20 NOTE — Patient Instructions (Addendum)
Nice to see you. Please keep your appointment with the nephrologist. Please do the exercises for your left hip. We will fax our note to your cardiologist.  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  If you are unable to get them on the phone please go to their office to make sure they evaluate your pacemaker.   Trochanteric Bursitis Rehab Ask your health care provider which exercises are safe for you. Do exercises exactly as told by your health care provider and adjust them as directed. It is normal to feel mild stretching, pulling, tightness, or discomfort as you do these exercises, but you should stop right away if you feel sudden pain or your pain gets worse.Do not begin these exercises until told by your health care provider. Stretching exercises These exercises warm up your muscles and joints and improve the movement and flexibility of your hip. These exercises also help to relieve pain and stiffness. Exercise A: Iliotibial band stretch  1. Lie on your side with your left / right leg in the top position. 2. Bend your left / right knee and grab your ankle. 3. Slowly bring your knee back so your thigh is behind your body. 4. Slowly lower your knee toward the floor until you feel a gentle stretch on the outside of your left / right thigh. If you do not feel a stretch and your knee will not fall farther, place the heel of your other foot on top of your outer knee and pull your thigh down farther. 5. Hold this position for __________ seconds. 6. Slowly return to the starting position. Repeat __________ times. Complete this exercise __________ times a day. Strengthening exercises These exercises build strength and endurance in your hip and pelvis. Endurance is the ability to use your muscles for a long time, even after they get tired. Exercise B: Bridge ( hip extensors) 1. Lie on your back on a firm surface with your knees bent and your feet flat on the  floor. 2. Tighten your buttocks muscles and lift your buttocks off the floor until your trunk is level with your thighs. You should feel the muscles working in your buttocks and the back of your thighs. If this exercise is too easy, try doing it with your arms crossed over your chest. 3. Hold this position for __________ seconds. 4. Slowly return to the starting position. 5. Let your muscles relax completely between repetitions. Repeat __________ times. Complete this exercise __________ times a day. Exercise C: Squats ( knee extensors and  quadriceps) 1. Stand in front of a table, with your feet and knees pointing straight ahead. You may rest your hands on the table for balance but not for support. 2. Slowly bend your knees and lower your hips like you are going to sit in a chair. ? Keep your weight over your heels, not over your toes. ? Keep your lower legs upright so they are parallel with the table legs. ? Do not let your hips go lower than your knees. ? Do not bend lower than told by your health care provider. ? If your hip pain increases, do not bend as low. 3. Hold this position for __________ seconds. 4. Slowly push with your legs to return to standing. Do not use your hands to pull yourself to standing. Repeat __________ times. Complete this exercise __________ times a day. Exercise D: Hip hike 1. Stand sideways on a bottom step. Stand on your left / right leg with your  other foot unsupported next to the step. You can hold onto the railing or wall if needed for balance. 2. Keeping your knees straight and your torso square, lift your left / right hip up toward the ceiling. 3. Hold this position for __________ seconds. 4. Slowly let your left / right hip lower toward the floor, past the starting position. Your foot should get closer to the floor. Do not lean or bend your knees. Repeat __________ times. Complete this exercise __________ times a day. Exercise E: Single leg stand 1. Stand  near a counter or door frame that you can hold onto for balance as needed. It is helpful to stand in front of a mirror for this exercise so you can watch your hip. 2. Squeeze your left / right buttock muscles then lift up your other foot. Do not let your left / right hip push out to the side. 3. Hold this position for __________ seconds. Repeat __________ times. Complete this exercise __________ times a day. This information is not intended to replace advice given to you by your health care provider. Make sure you discuss any questions you have with your health care provider. Document Released: 05/09/2004 Document Revised: 12/07/2015 Document Reviewed: 03/17/2015 Elsevier Interactive Patient Education  [employer].

## 2017-08-20 NOTE — Assessment & Plan Note (Signed)
No new symptoms.  He will continue to follow with neurology.  Continue risk factor management.

## 2017-08-21 DIAGNOSIS — Z950 Presence of cardiac pacemaker: Secondary | ICD-10-CM | POA: Diagnosis not present

## 2017-08-21 DIAGNOSIS — I4430 Unspecified atrioventricular block: Secondary | ICD-10-CM | POA: Diagnosis not present

## 2017-09-01 DIAGNOSIS — R800 Isolated proteinuria: Secondary | ICD-10-CM | POA: Diagnosis not present

## 2017-09-01 DIAGNOSIS — Z7289 Other problems related to lifestyle: Secondary | ICD-10-CM | POA: Diagnosis not present

## 2017-09-01 DIAGNOSIS — Z113 Encounter for screening for infections with a predominantly sexual mode of transmission: Secondary | ICD-10-CM | POA: Diagnosis not present

## 2017-09-01 DIAGNOSIS — Z114 Encounter for screening for human immunodeficiency virus [HIV]: Secondary | ICD-10-CM | POA: Diagnosis not present

## 2017-09-01 DIAGNOSIS — Z1159 Encounter for screening for other viral diseases: Secondary | ICD-10-CM | POA: Diagnosis not present

## 2017-09-22 ENCOUNTER — Telehealth: Payer: Self-pay

## 2017-09-22 NOTE — Telephone Encounter (Signed)
Please advise 

## 2017-09-22 NOTE — Telephone Encounter (Signed)
Called patients wife to clarify who prescribed this medication and was prescribed by Dr. Sherlean FootSinha but she states that Dr. Birdie SonsSonnenberg prescribes patients medications. Please advise

## 2017-09-22 NOTE — Telephone Encounter (Signed)
Copied from CRM [id number]. Topic: General - Other >> Sep 22, 2017  9:44 AM Gerrianne ScalePayne, Angela L wrote: Reason for CRM: pt wife Corrie DandyMary calling wanting Dr Birdie SonsSonnenberg to know that [facility] of Kepra ER 500mg  to two times in the morning and two times in the evening she would like a RX for it for 90 days they would like to use the [facility] in [place] if Dr Birdie SonsSonnenberg have any question please give wife Corrie DandyMary a call back at [phone]

## 2017-09-22 NOTE — Telephone Encounter (Signed)
They need to contact the neurologist for this as I do not manage seizure medications. Thanks.

## 2017-09-22 NOTE — Telephone Encounter (Signed)
Patients wife notified

## 2017-10-08 ENCOUNTER — Ambulatory Visit: Payer: Medicare Other

## 2017-10-17 ENCOUNTER — Other Ambulatory Visit: Payer: Self-pay | Admitting: Family Medicine

## 2017-11-20 DIAGNOSIS — Z950 Presence of cardiac pacemaker: Secondary | ICD-10-CM | POA: Diagnosis not present

## 2017-11-20 DIAGNOSIS — I4430 Unspecified atrioventricular block: Secondary | ICD-10-CM | POA: Diagnosis not present

## 2017-12-19 ENCOUNTER — Ambulatory Visit: Payer: Medicare Other

## 2018-01-01 DIAGNOSIS — Z23 Encounter for immunization: Secondary | ICD-10-CM | POA: Diagnosis not present

## 2018-01-09 ENCOUNTER — Other Ambulatory Visit: Payer: Self-pay | Admitting: Family Medicine

## 2018-01-10 ENCOUNTER — Other Ambulatory Visit: Payer: Self-pay | Admitting: Family Medicine

## 2018-01-26 DIAGNOSIS — I613 Nontraumatic intracerebral hemorrhage in brain stem: Secondary | ICD-10-CM | POA: Diagnosis not present

## 2018-01-26 DIAGNOSIS — G40109 Localization-related (focal) (partial) symptomatic epilepsy and epileptic syndromes with simple partial seizures, not intractable, without status epilepticus: Secondary | ICD-10-CM | POA: Diagnosis not present

## 2018-01-26 DIAGNOSIS — I63532 Cerebral infarction due to unspecified occlusion or stenosis of left posterior cerebral artery: Secondary | ICD-10-CM | POA: Diagnosis not present

## 2018-01-31 ENCOUNTER — Other Ambulatory Visit: Payer: Self-pay | Admitting: Family Medicine

## 2018-02-24 DIAGNOSIS — I4430 Unspecified atrioventricular block: Secondary | ICD-10-CM | POA: Diagnosis not present

## 2018-02-24 DIAGNOSIS — Z950 Presence of cardiac pacemaker: Secondary | ICD-10-CM | POA: Diagnosis not present

## 2018-02-24 DIAGNOSIS — Z45018 Encounter for adjustment and management of other part of cardiac pacemaker: Secondary | ICD-10-CM | POA: Diagnosis not present

## 2018-03-04 ENCOUNTER — Other Ambulatory Visit: Payer: Self-pay

## 2018-04-11 ENCOUNTER — Other Ambulatory Visit: Payer: Self-pay | Admitting: Family Medicine

## 2018-05-28 DIAGNOSIS — Z950 Presence of cardiac pacemaker: Secondary | ICD-10-CM | POA: Diagnosis not present

## 2018-05-28 DIAGNOSIS — Z45018 Encounter for adjustment and management of other part of cardiac pacemaker: Secondary | ICD-10-CM | POA: Diagnosis not present

## 2018-05-28 DIAGNOSIS — I4430 Unspecified atrioventricular block: Secondary | ICD-10-CM | POA: Diagnosis not present

## 2018-05-29 ENCOUNTER — Ambulatory Visit (INDEPENDENT_AMBULATORY_CARE_PROVIDER_SITE_OTHER): Payer: Medicare Other | Admitting: Family Medicine

## 2018-05-29 ENCOUNTER — Encounter: Payer: Self-pay | Admitting: Family Medicine

## 2018-05-29 DIAGNOSIS — S0990XA Unspecified injury of head, initial encounter: Secondary | ICD-10-CM | POA: Diagnosis not present

## 2018-05-29 HISTORY — DX: Unspecified injury of head, initial encounter: S09.90XA

## 2018-05-29 NOTE — Progress Notes (Signed)
[name], MD Phone: [phone]  Blake Osborne is a 78 y.o. male who presents today for same-day visit.  CC: Head injury  Patient presents with his wife.  The wife is the one that brought this up as they presented for her office visit today and he was just accompanying her.  The injury occurred 5 days ago.  The patient's dogs were running at him and jumped up and knocked him over.  He tripped and hit the right occipital area of his head on a chair and then fell to the floor.  He notes he had a cut in his right occipital area and an abrasion in his right mastoid area.  The occipital cut bled though stopped quickly.  He notes the areas of swelling in these areas have been improving.  He has not had any recurrent bleeding.  He had no loss of consciousness.  He had no headache.  No confusion.  No numbness or weakness.  He feels as though his vision might be slightly improved since this occurred.  He had been having some dizziness though this was going on before the fall and has improved.  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  He is on Eliquis.  He did not get evaluated at the time of the injury.  Social History   Tobacco Use  Smoking Status Never Smoker  Smokeless Tobacco Never Used     ROS see history of present illness  Objective  Physical Exam Vitals:   05/29/18 1113  BP: 106/70  Pulse: 83  Temp: 97.6 F (36.4 C)  SpO2: 95%    BP Readings from Last 3 Encounters:  05/29/18 106/70  08/20/17 112/66  06/18/17 118/76   Wt Readings from Last 3 Encounters:  05/29/18 211 lb 12.8 oz (96.1 kg)  08/20/17 202 lb 3.2 oz (91.7 kg)  06/18/17 199 lb 3.2 oz (90.4 kg)    Physical Exam Constitutional:      General: He is not in acute distress.    Appearance: He is not diaphoretic.  HENT:     Head: No raccoon eyes or Battle's sign.      Right Ear: Tympanic membrane and ear canal normal. No  hemotympanum.     Left Ear: Tympanic membrane and ear canal normal. No hemotympanum.  Cardiovascular:     Rate and Rhythm: Normal rate and regular rhythm.     Heart sounds: Normal heart sounds.  Pulmonary:     Effort: Pulmonary effort is normal.     Breath sounds: Normal breath sounds.  Skin:    General: Skin is warm and dry.  Neurological:     Mental Status: He is alert.     Comments: CN 2-12 intact, 5/5 strength in bilateral biceps, triceps, grip, quads, hamstrings, plantar and dorsiflexion, sensation to light touch intact in bilateral UE and LE, normal gait, normal finger-to-nose, normal rapid alternating movements      Assessment/Plan: Please see individual problem list.  Head injury Patient with head injury 5 days ago.  Wound cleansed with sterile saline with no bleeding.  No palpable underlying defects or bony step-off of the skull.  No signs of basilar skull fracture.  Discussed obtaining CT head given that he is on a anticoagulant though he deferred this.  I advised that if he were to develop any neurological symptoms or headache or anything new he would need to be evaluated in the emergency department for  a CT scan of his head.  He voiced understanding.  Given return precautions. Also advised in the future he should be evaluated when he hits his head or if he has bleeding that is difficult to get stopped within 10 or so minutes or is excessive bleeding.  No orders of the defined types were placed in this encounter.   No orders of the defined types were placed in this encounter.    [name], MD [facility]

## 2018-05-29 NOTE — Assessment & Plan Note (Signed)
Patient with head injury 5 days ago.  Wound cleansed with sterile saline with no bleeding.  No palpable underlying defects or bony step-off of the skull.  No signs of basilar skull fracture.  Discussed obtaining CT head given that he is on a anticoagulant though he deferred this.  I advised that if he were to develop any neurological symptoms or headache or anything new he would need to be evaluated in the emergency department for a CT scan of his head.  He voiced understanding.  Given return precautions.

## 2018-05-29 NOTE — Patient Instructions (Signed)
Nice to see you. Please monitor for any recurrent bleeding.  If this occurs please be evaluated. If you develop any headache or neurological symptoms please be evaluated immediately.

## 2018-07-28 ENCOUNTER — Telehealth: Payer: Medicare Other | Admitting: Physician Assistant

## 2018-07-28 DIAGNOSIS — R21 Rash and other nonspecific skin eruption: Secondary | ICD-10-CM

## 2018-07-28 MED ORDER — FLUOCINONIDE-E 0.05 % EX CREA: 1.0000 "application " | TOPICAL_CREAM | Freq: Two times a day (BID) | CUTANEOUS | 0 refills | Status: DC

## 2018-07-28 NOTE — Progress Notes (Signed)
Patient submitted to evisits, the second was to request [employer].  I have filled.  Will not charge for this evisit. Deliah Boston, MS, PA-C 3:44 PM, 07/28/2018

## 2018-07-28 NOTE — Progress Notes (Signed)
We are sorry that you are not feeing well.  Here is how we plan to help!  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  This resin is very sticky, so it easily attaches to your skin, clothing, tools equipment, and pet's fur.    This blistering rash is often called poison ivy rash although it can come from contact with the leaves, stems and roots of poison ivy, poison oak and poison sumac.  The oily resin contains urushiol (u-ROO-she-ol) that produces a skin rash on exposed skin.  The severity of the rash depends on the amount of urushiol that gets on your skin.  A section of skin with more urushiol on it may develop a rash sooner.  The rash usually develops 12-48 hours after exposure and can last two to three weeks.  Your skin must come in direct contact with the plant's oil to be affected.  Blister fluid doesn't spread the rash.  However, if you come into contact with a piece of clothing or pet fur that has urushiol on it, the rash may spread out.  You can also transfer the oil to other parts of your body with your fingers.  Often the rash looks like a straight line because of the way the plant brushes against your skin.    I have developed the following plan to treat your condition Most poison ivy treatments are usually limited to self-care methods. Small areas of the rash will typically go away on its own in two to three weeks.  Since your rash is limited, I am recommending that you follow these recommendations:  Make sure that the clothes you were wearing and any towels or sheets that may have come in contact with the oil (urushiol) are washed in detergent and hot water.  Apply Benadryl or Caladryl lotion to the rash.  You may apply these as often as needed to control the itching.  Cool baths also often help with itching.  You may also apply an over-the-counter corticosteroid cream for the first few days.  Take oral  antihistamines, such as diphenhydramine (Benadryl, others), which may also help you sleep better.  Soak in a cool-water bath containing an oatmeal-based bath product (Aveeno).  Place Cool, wet compresses on the affected area for 15 to 30 minutes several times a day.  Avoid a hot shower or bath as this may increase your itching.       What can you do to prevent this rash?  Avoid the plants.  Learn how to identify poison ivy, poison oak and poison sumac in all seasons.  When hiking or engaging in other activities that might expose you to these plants, try to stay on cleared pathways.  If camping, make sure you pitch your tent in an area free of these plants.  Keep pets from running through wooded areas so that urushiol doesn't accidentally stick to their fur, which you may touch.  Remove or kill the plants.  In your yard, you can get rid of poison ivy by applying an herbicide or pulling it out of the ground, including the roots, while wearing heavy gloves.  Afterward remove the gloves and thoroughly wash them and your hands.  Don't burn poison ivy or related plants because the urushiol can be carried by smoke.  Wear protective clothing.  If needed, protect your skin by wearing socks, boots, pants, long sleeves  and vinyl gloves.  Wash your skin right away.  Washing off the oil with soap and water within 30 minutes of exposure may reduce your chances of getting a poison ivy rash.  Even washing after an hour or so can help reduce the severity of the rash.  If you walk through some poison ivy and then later touch your shoes, you may get some urushiol on your hands, which may then transfer to your face or body by touching or rubbing.  If the contaminated object isn't cleaned, the urushiol on it can still cause a skin reaction years later.    Be careful not to reuse towels after you have washed your skin.  Also carefully wash clothing in detergent and hot water to remove all traces of the oil.  Handle  contaminated clothing carefully so you don't transfer the urushiol to yourself, furniture, rugs or appliances.  Remember that pets can carry the oil on their fur and paws.  If you think your pet may be contaminated with urushiol, put on some long rubber gloves and give your pet a bath.  Finally, be careful not to burn these plants as the smoke can contain traces of the oil.  Inhaling the smoke may result in difficulty breathing. If that occurred you should see a physician as soon as possible.  See your doctor right away if:  The reaction is severe or widespread You inhaled the smoke from burning poison ivy and are having difficulty breathing Your skin continues to swell The rash affects your eyes, mouth or genitals Blisters are oozing pus You develop a fever greater than 100 F (37.8 C) The rash doesn't get better within a few weeks.  If you scratch the poison ivy rash, bacteria under your fingernails may cause the skin to become infected.  See your doctor if pus starts oozing from the blisters.  Treatment generally includes antibiotics.  Poison ivy treatments are usually limited to self-care methods.  And the rash typically goes away on its own in two to three weeks.     If the rash is widespread or results in a large number of blisters, your doctor may prescribe an oral corticosteroid, such as prednisone.  If a bacterial infection has developed at the rash site, your doctor may give you a prescription for an oral antibiotic.  MAKE SURE YOU  Understand these instructions. Will watch your condition. Will get help right away if you are not doing well or get worse.  Thank you for choosing an e-visit. Your e-visit answers were reviewed by a board certified advanced clinical practitioner to complete your personal care plan. Depending upon the condition, your plan could have included both over the counter or prescription medications.  Please review your pharmacy choice. If there is a problem  you may use MyChart messaging to have the prescription routed to another pharmacy.   Your safety is important to [place]. If you have drug allergies check your prescription carefully.  You can use MyChart to ask questions about today's visit, request a non-urgent call back, or ask for a work or school excuse for 24 hours related to this e-Visit. If it has been greater than 24 hours you will need to follow up with your provider, or enter a new e-Visit to address those concerns.   You will get an email in the next two days asking about your experience. I hope that your e-visit has been valuable and will speed your recovery Thank you for choosing an  e-visit.      ===View-only below this line===   ----- Message -----    From: Beola Cord    Sent: 07/28/2018  3:27 PM EDT      To: E-Visit Mailing List Subject: E-Visit Submission: Poison Ivy  E-Visit Submission: Poison Ivy --------------------------------  Question: Which best describes the location of your rash? Answer:   On multiple sites  Question: What best describes your rash? Answer:   A diffuse itching rash  Question: Were you recently outside? Answer:   Yes  Question: If YES, Were you possibly exposed to poison ivy, oak or sumac leaves? Answer:   Yes  Question: If NO, Do you have a pet that was out of doors? Answer:   Yes  Question: Has anyone in your family been out of doors and you have handled their clothes, shoes, equipment or towels? Answer:   Yes  Question: Have you been outside near anyone burning brush (yard debris) Answer:   No  Question: If YES, Are you abnormally wheezing or short of breath since that exposure? Answer:   No  Question: How long was it between the time you were exposed and the apppearance of the rash? Answer:   Less than a week  Question: How long has your rash been present? Answer:   Less than a week  Question: Have you used any over the counter medications for your  rash? Answer:   No  Question: Which of the following OTC Medications have you used? Loraine Leriche all that apply) Answer:   Hydrocortisone ointment  Question: Have you developed a fever? Answer:   No  Question: Is the rash spreading? Answer:   Yes  Question: Have you scratched the rash and broken the skin or blisters? Answer:   Yes  Question: If YES, Has pus (milky purulent drainage) developed from or around the blisters? Answer:   No  Question: Can you tolerate oral prednisone? Answer:   No  Question: Do you have any other comments or concerns for your provider? Answer:     Question: Are you able to attach a photograph of the rash? Answer:   No  Question: Please list your medication allergies that you may have ? (If 'none' , please list as 'none') Answer:   no  Question: Please list any additional comments  Answer:   for delivery from pharm  A total of 5-10 minutes was spent evaluating this patients questionnaire and formulating a plan of care.

## 2018-08-26 DIAGNOSIS — I4430 Unspecified atrioventricular block: Secondary | ICD-10-CM | POA: Diagnosis not present

## 2018-08-26 DIAGNOSIS — Z950 Presence of cardiac pacemaker: Secondary | ICD-10-CM | POA: Diagnosis not present

## 2018-08-26 DIAGNOSIS — Z45018 Encounter for adjustment and management of other part of cardiac pacemaker: Secondary | ICD-10-CM | POA: Diagnosis not present

## 2018-09-03 ENCOUNTER — Other Ambulatory Visit: Payer: Self-pay

## 2018-09-03 ENCOUNTER — Ambulatory Visit (INDEPENDENT_AMBULATORY_CARE_PROVIDER_SITE_OTHER): Payer: Medicare Other

## 2018-09-03 DIAGNOSIS — Z0000 Encounter for general adult medical examination without abnormal findings: Secondary | ICD-10-CM

## 2018-09-03 NOTE — Progress Notes (Signed)
Subjective:   Blake Osborne is a 78 y.o. male who presents for Medicare Annual/Subsequent preventive examination.  Review of Systems:  No ROS.  Medicare Wellness Virtual Visit.  Visual/audio telehealth visit, UTA vital signs.   See social history for additional risk factors.   Cardiac Risk Factors include: advanced age (>3men, >67 women);male gender;hypertension     Objective:    Vitals: There were no vitals taken for this visit.  There is no height or weight on file to calculate BMI.  Advanced Directives 09/03/2018 10/07/2016 04/06/2016  Does Patient Have a Medical Advance Directive? Yes Yes Yes  Type of Estate agent of [place];Living will Living will;Healthcare Power of Attorney Living will  Does patient want to make changes to medical advance directive? No - Patient declined No - Patient declined -  Copy of Healthcare Power of Attorney in Chart? No - copy requested No - copy requested -    Tobacco Social History   Tobacco Use  Smoking Status Never Smoker  Smokeless Tobacco Never Used     Counseling given: Not Answered   Clinical Intake:  Pre-visit preparation completed: Yes        Diabetes: No  How often do you need to have someone help you when you read instructions, pamphlets, or other written materials from your doctor or pharmacy?: 1 - Never  Interpreter Needed?: No     Past Medical History:  Diagnosis Date  . Atrial fibrillation (HCC)   . 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 Hyperlipidemia   . Hypertension   . Left BB block NEC   . Pacemaker    Past Surgical History:  Procedure Laterality Date  . CARDIAC SURGERY    . CORONARY ARTERY BYPASS GRAFT     Family History  Problem Relation Age of Onset  . Heart disease Father   . Aneurysm Brother        heart   Social History   Socioeconomic History  . Marital status: Married    Spouse name: Not on  file  . Number of children: Not on file  . Years of education: Not on file  . Highest education level: Not on file  Occupational History  . Not on file  Social Needs  . Financial resource strain: Not hard at all  . Food insecurity:    Worry: Never true    Inability: Never true  . Transportation needs:    Medical: No    Non-medical: No  Tobacco Use  . Smoking status: Never Smoker  . Smokeless tobacco: Never Used  Substance and Sexual Activity  . Alcohol use: Yes    Alcohol/week: 0.0 standard drinks  . Drug use: No  . Sexual activity: Not on file  Lifestyle  . Physical activity:    Days per week: 4 days    Minutes per session: 20 min  . Stress: Not at all  Relationships  . Social connections:    Talks on phone: Not on file    Gets together: Not on file    Attends religious service: Not on file    Active member of club or organization: Not on file    Attends meetings of clubs or organizations: Not on file    Relationship status: Not on file  Other Topics Concern  . Not on file  Social History Narrative  . Not on file    Outpatient Encounter Medications as  of 09/03/2018  Medication Sig  . allopurinol (ZYLOPRIM) 100 MG tablet TAKE 1 TABLET BY MOUTH ONCE DAILY  . cetirizine (ZYRTEC) 10 MG tablet Take 10 mg by mouth daily as needed.   Marland Kitchen. ELIQUIS 5 MG TABS tablet TAKE 1 TABLET BY MOUTH TWICE DAILY  . fluocinonide-emollient (LIDEX-E) 0.05 % cream Apply 1 application topically 2 (two) times daily.  Marland Kitchen. FLUoxetine (PROZAC) 20 MG tablet TAKE 1 TABLET BY MOUTH ONCE DAILY  . gabapentin (NEURONTIN) 100 MG capsule TAKE 2 CAPSULES BY MOUTH TWICE DAILY  . hydrochlorothiazide (MICROZIDE) 12.5 MG capsule TAKE 1 CAPSULE BY MOUTH ONCE DAILY  . isosorbide mononitrate (IMDUR) 30 MG 24 hr tablet TAKE 1 TABLET BY MOUTH ONCE DAILY  . metoprolol succinate (TOPROL-XL) 25 MG 24 hr tablet TAKE 1 TABLET BY MOUTH ONCE DAILY  . rosuvastatin (CRESTOR) 40 MG tablet TAKE 1 TABLET BY MOUTH ONCE DAILY  .  [DISCONTINUED] allopurinol (ZYLOPRIM) 100 MG tablet TAKE 1 TABLET BY MOUTH ONCE DAILY   No facility-administered encounter medications on file as of 09/03/2018.     Activities of Daily Living In your present state of health, do you have any difficulty performing the following activities: 09/03/2018  Hearing? Y  Comment Hearing aids  Vision? N  Difficulty concentrating or making decisions? N  Walking or climbing stairs? N  Dressing or bathing? N  Doing errands, shopping? Y  Comment Not allowed to drive at this time due to vision.  Preparing Food and eating ? N  Using the Toilet? N  In the past six months, have you accidently leaked urine? N  Do you have problems with loss of bowel control? N  Managing your Medications? Y  Comment Wife manages  Managing your Finances? N  Housekeeping or managing your Housekeeping? N  Some recent data might be hidden    Patient Care Team: Glori LuisSonnenberg, Eric G, MD as PCP - General (Family Medicine)   Assessment:   This is a routine wellness examination for Blake Osborne.  I connected with patient 09/03/18 at 10:00 AM EDT by a video/audio enabled telemedicine application and verified that I am speaking with the correct person using two identifiers. Patient stated full name and DOB. Patient gave permission to continue with virtual visit. Patient's location was at home and Nurse's location was at [facility] office.   Health Screenings  Colonoscopy- 05/2014 Glaucoma -none Hearing - hearing aids Labs followed by pcp. See care everywhere.  Dental- every 6 months Vision- plans to schedule  Social  Alcohol intake - yes, rare     Smoking history- never    Smokers in home? none Illicit drug use? none Exercise - walks the dogs daily at a mild to moderate pace Diet - low carb diet BMI- discussed the importance of a healthy diet, water intake and the benefits of aerobic exercise.  Educational material provided.   Safety  Patient feels safe at home- yes Patient  does have smoke detectors at home- yes Patient does wear sunscreen or protective clothing when in direct sunlight -yes Patient does wear seat belt when in a moving vehicle -yes  Covid-19 precautions and sickness symptoms discussed.   Activities of Daily Living Patient denies needing assistance with: driving, household chores, feeding themselves, getting from bed to chair, getting to the toilet, bathing/showering, dressing, managing money, or preparing meals.  No new identified risk were noted.    Depression Screen Patient denies losing interest in daily life, feeling hopeless, or crying easily over simple problems.   Medication-taking  as directed and without issues.   Fall Screen Patient denies being afraid of falling or falling in the last year.   Memory Screen Patient is alert.  Patient denies difficulty focusing, concentrating or misplacing items. Correctly identified the president of the [place] , season and recall.  Immunizations The following Immunizations were discussed: Influenza, shingles, pneumonia, and tetanus.   Other Providers Patient Care Team: [name], MD as PCP - General (Family Medicine)   Exercise Activities and Dietary recommendations Current Exercise Habits: Home exercise routine, Type of exercise: walking(Works part time), Intensity: Mild  Goals      Patient Stated   . Obtain Annual Eye (retinal)  Exam  (pt-stated)     Wears glasses       Fall Risk Fall Risk  09/03/2018 03/04/2018 10/07/2016 11/07/2015 05/24/2014  Falls in the past year? 0 0 No No No  Comment - Emmi Telephone Survey: data to providers prior to load - - -   Depression Screen PHQ 2/9 Scores 09/03/2018 10/07/2016 11/07/2015 05/24/2014  PHQ - 2 Score 0 0 0 0  PHQ- 9 Score - 0 - -    Cognitive Function MMSE - Mini Mental State Exam 10/07/2016  Orientation to time 5  Orientation to Place 5  Registration 3  Attention/ Calculation 5  Recall 3  Language- name 2 objects 2   Language- repeat 1  Language- follow 3 step command 3  Language- read & follow direction 1  Write a sentence 1  Copy design 1  Total score 30     6CIT Screen 09/03/2018  What Year? 0 points  What month? 0 points  What time? 0 points  Count back from 20 0 points  Months in reverse 0 points  Repeat phrase 0 points  Total Score 0    Immunization History  Administered Date(s) Administered  . Influenza Split 01/14/2011, 01/16/2012, 02/08/2014  . Influenza-Unspecified 03/15/2015, 01/01/2018  . Pneumococcal Conjugate-13 02/08/2014  . Pneumococcal Polysaccharide-23 06/15/2008  . Zoster 06/16/2010   Screening Tests Health Maintenance  Topic Date Due  . TETANUS/TDAP  01[birth year]10/1960  . INFLUENZA VACCINE  11/14/2018  . PNA vac Low Risk Adult  Completed    Plan:    End of life planning; Advance aging; Advanced directives discussed.  Copy of current HCPOA/Living Will requested.    I have personally reviewed and noted the following in the patient's chart:   . Medical and social history . Use of alcohol, tobacco or illicit drugs  . Current medications and supplements . Functional ability and status . Nutritional status . Physical activity . Advanced directives . List of other physicians . Hospitalizations, surgeries, and ER visits in previous 12 months . Vitals . Screenings to include cognitive, depression, and falls . Referrals and appointments  In addition, I have reviewed and discussed with patient certain preventive protocols, quality metrics, and best practice recommendations. A written personalized care plan for preventive services as well as general preventive health recommendations were provided to patient.     Ashok Pall, LPN  07/16/4740

## 2018-09-03 NOTE — Patient Instructions (Addendum)
  Blake Osborne , Thank you for taking time to come for your Medicare Wellness Visit. I appreciate your ongoing commitment to your health goals. Please review the following plan we discussed and let me know if I can assist you in the future.   These are the goals we discussed: Goals      Patient Stated   . Obtain Annual Eye (retinal)  Exam  (pt-stated)     Wears glasses       This is a list of the screening recommended for you and due dates:  Health Maintenance  Topic Date Due  . Tetanus Vaccine  01[birth year]10/1960  . Flu Shot  11/14/2018  . Pneumonia vaccines  Completed

## 2018-09-10 NOTE — Progress Notes (Signed)
LPN note reviewed. No acute/new issues with patient. Will continue to follow with PCP

## 2018-10-21 ENCOUNTER — Other Ambulatory Visit: Payer: Self-pay | Admitting: Family Medicine

## 2018-12-09 DIAGNOSIS — Z950 Presence of cardiac pacemaker: Secondary | ICD-10-CM | POA: Diagnosis not present

## 2018-12-09 DIAGNOSIS — Z45018 Encounter for adjustment and management of other part of cardiac pacemaker: Secondary | ICD-10-CM | POA: Diagnosis not present

## 2018-12-09 DIAGNOSIS — I4430 Unspecified atrioventricular block: Secondary | ICD-10-CM | POA: Diagnosis not present

## 2019-01-05 ENCOUNTER — Other Ambulatory Visit: Payer: Self-pay | Admitting: Physician Assistant

## 2019-01-05 DIAGNOSIS — Z23 Encounter for immunization: Secondary | ICD-10-CM | POA: Diagnosis not present

## 2019-01-23 IMAGING — US US RENAL
1 series · 14 of 25 positions shown · non-contrast
Comparison: None.

CLINICAL DATA: 76-year-old male with proteinuria. Initial
encounter.

EXAM:
RENAL / URINARY TRACT ULTRASOUND COMPLETE

[Series 1: us renal · 0.23mm/px · 14 of 48 slices shown]
[im 1/48]
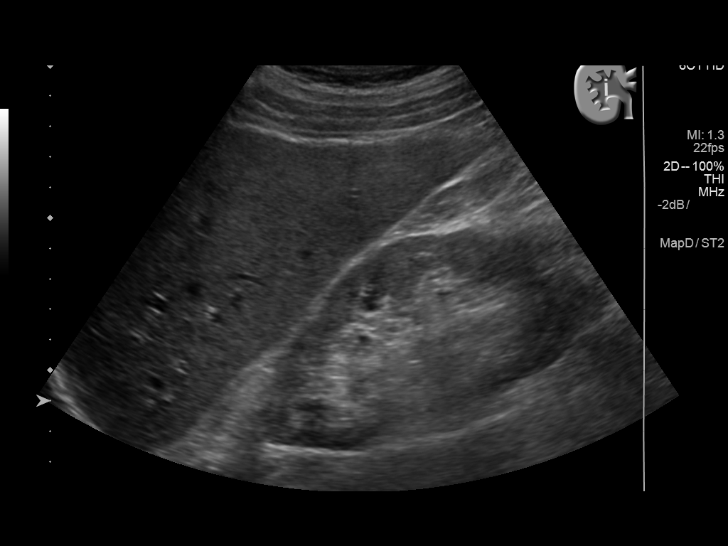
[im 4/48]
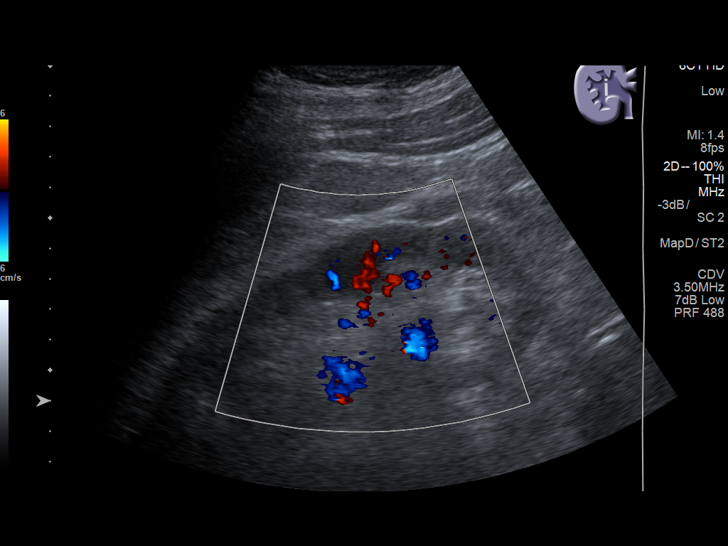
[im 8/48]
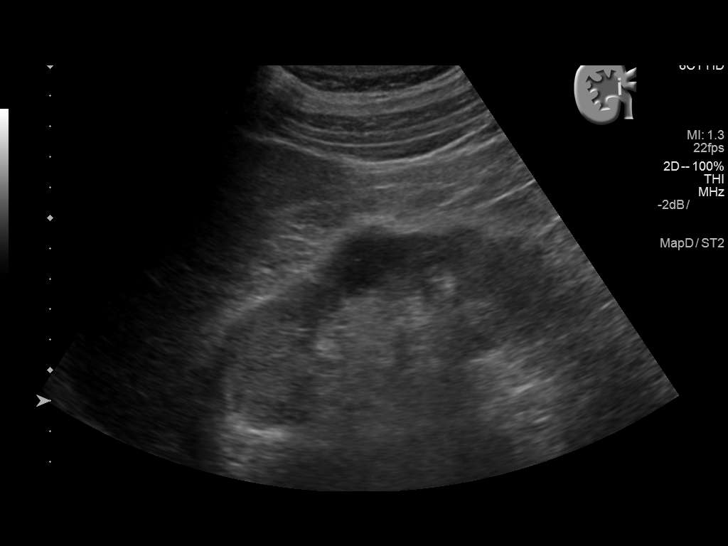
[im 12/48]
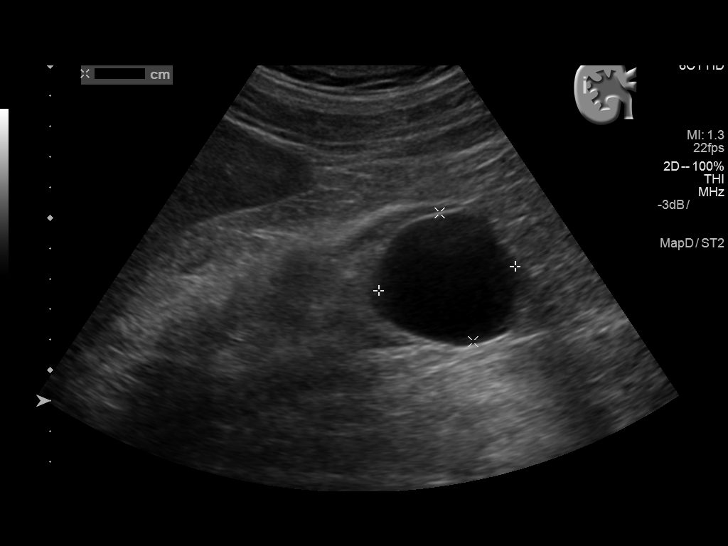
[im 16/48]
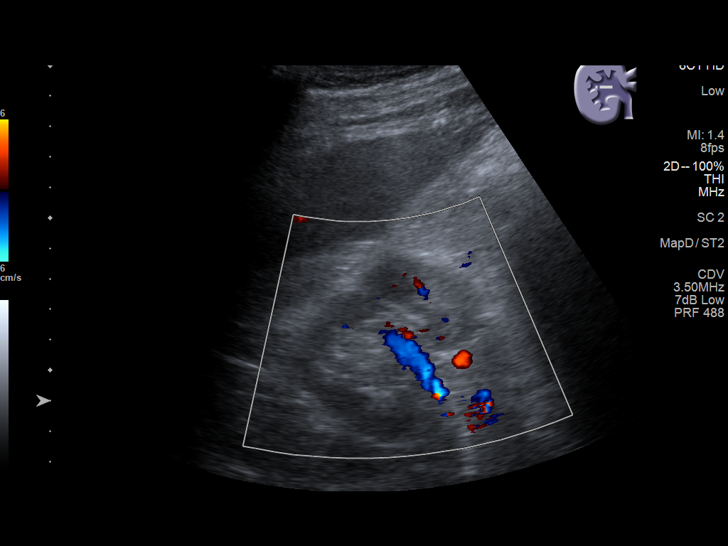
[im 18/48]
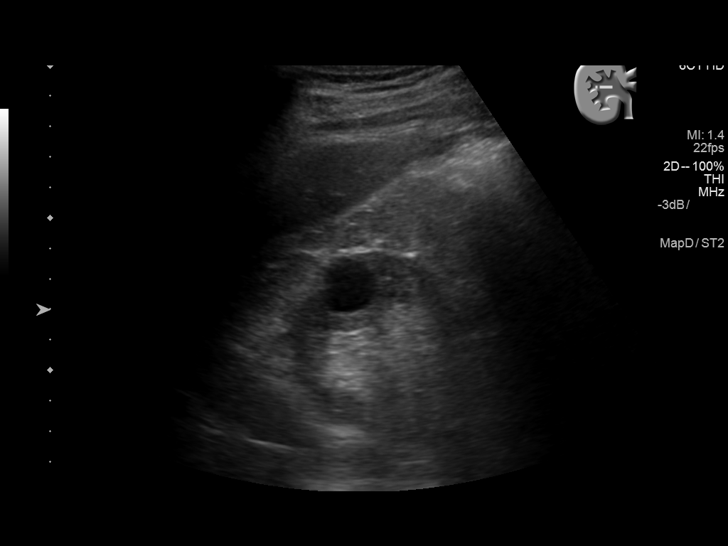
[im 22/48]
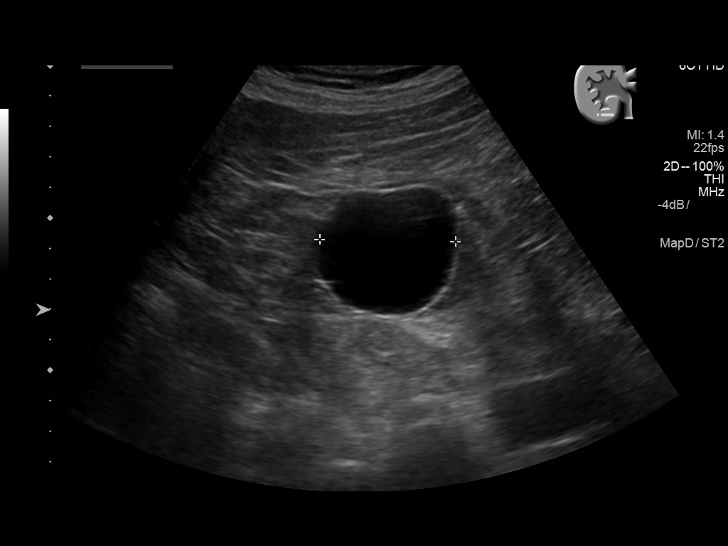
[im 26/48]
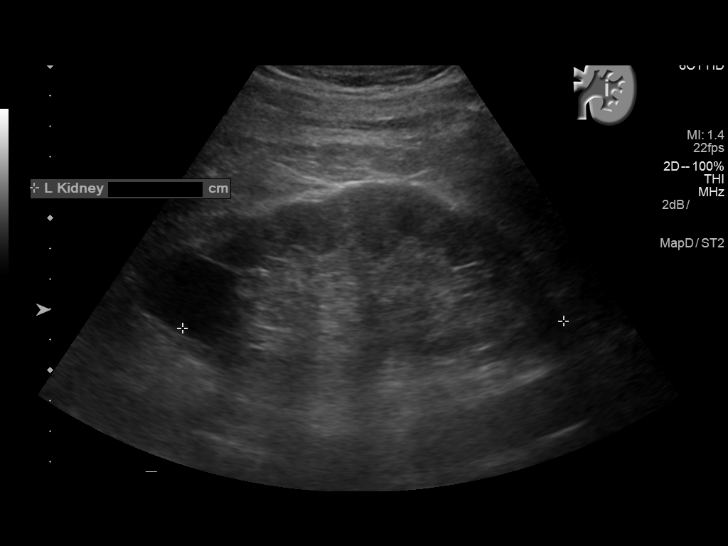
[im 30/48]
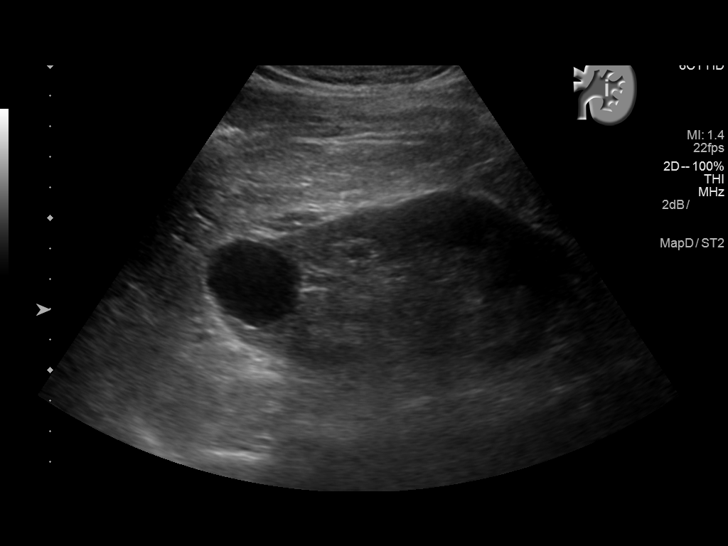
[im 32/48]
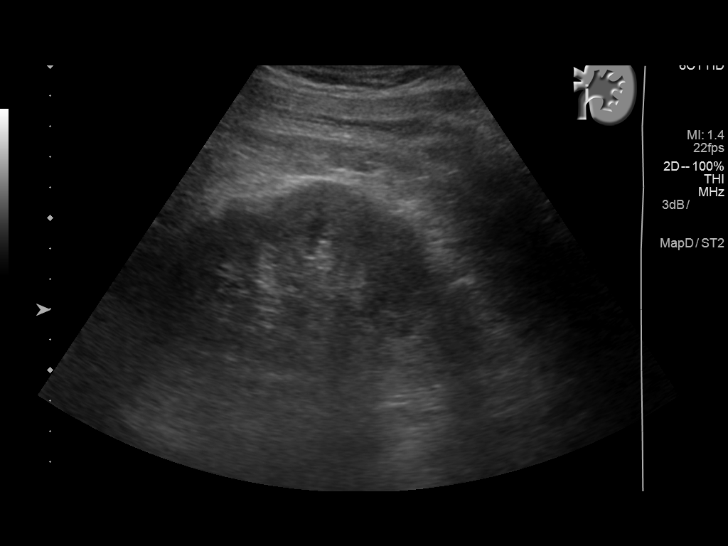
[im 36/48]
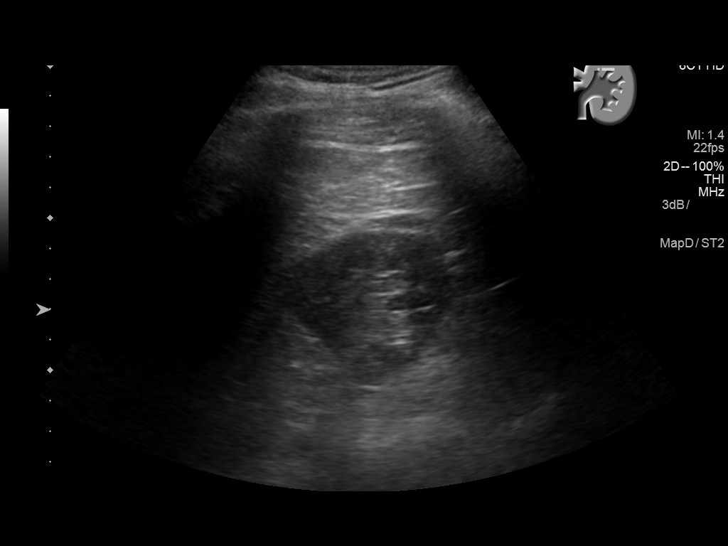
[im 40/48]
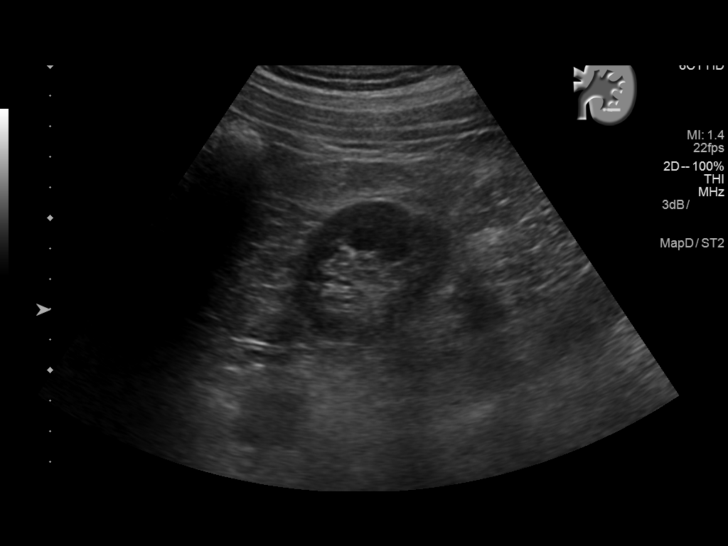
[im 44/48]
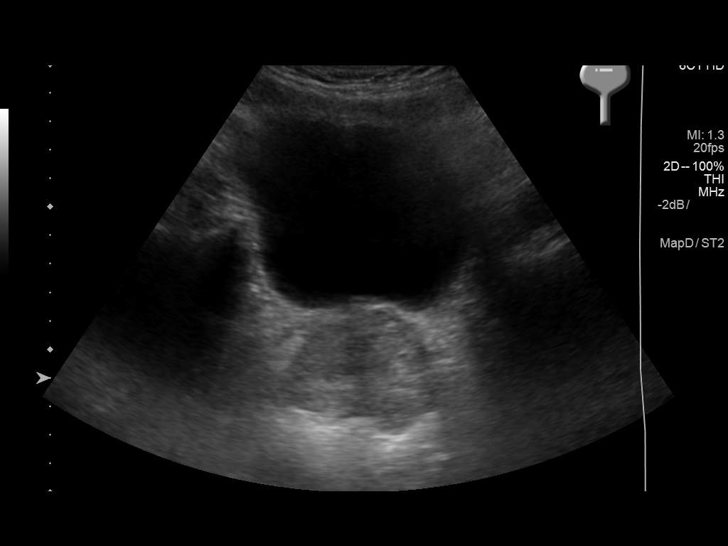
[im 48/48]
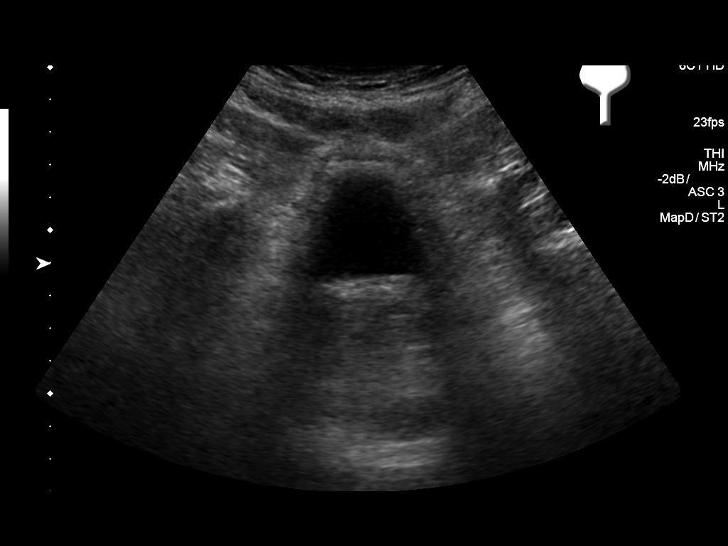

[14 of 25 positions shown; findings below may reference images not displayed]

FINDINGS: Right Kidney:

Length: 12 cm. Echogenicity within normal limits. No hydronephrosis.
Three right renal cysts largest lower pole measures up to 4.6 cm.

Left Kidney:

Length: 12.5 cm. Echogenicity within normal limits. No
hydronephrosis. Upper pole 3 cm cyst.

Bladder:

Appears normal for degree of bladder distention.
IMPRESSION: No hydronephrosis.

Bilateral renal cysts largest within the lower pole of the right
kidney measures up to 4.6 cm.

## 2019-01-28 ENCOUNTER — Other Ambulatory Visit: Payer: Self-pay | Admitting: Family Medicine

## 2019-01-29 ENCOUNTER — Other Ambulatory Visit: Payer: Self-pay | Admitting: Family Medicine

## 2019-02-01 ENCOUNTER — Other Ambulatory Visit: Payer: Self-pay | Admitting: Family Medicine

## 2019-02-01 MED ORDER — METOPROLOL SUCCINATE ER 25 MG PO TB24: 25.0000 mg | ORAL_TABLET | Freq: Every day | ORAL | 0 refills | Status: DC

## 2019-02-01 MED ORDER — APIXABAN 5 MG PO TABS: 5.0000 mg | ORAL_TABLET | Freq: Two times a day (BID) | ORAL | 0 refills | Status: DC

## 2019-02-01 NOTE — Telephone Encounter (Signed)
Medication Refill - Medication: ELIQUIS 5 MG TABS tablet metoprolol succinate (TOPROL-XL) 25 MG 24 hr tablet   Has the patient contacted their pharmacy? Yes.   (Agent: If no, request that the patient contact the pharmacy for the refill.) (Agent: If yes, when and what did the pharmacy advise?)  Preferred Pharmacy (with phone number or street name): [id number] - [place], [place] [address]  Agent: Please be advised that RX refills may take up to 3 business days. We ask that you follow-up with your pharmacy.

## 2019-02-01 NOTE — Telephone Encounter (Signed)
These appear to have already been refilled. Patient has not been seen for follow-up in some time. Please call to get him scheduled for follow-up some time in the next couple of months. Thanks.

## 2019-02-02 NOTE — Telephone Encounter (Signed)
Called and scheduled patient for a f/up in 2 months.  Nina,cma

## 2019-04-07 ENCOUNTER — Ambulatory Visit (INDEPENDENT_AMBULATORY_CARE_PROVIDER_SITE_OTHER): Payer: Medicare Other | Admitting: Family Medicine

## 2019-04-07 ENCOUNTER — Encounter: Payer: Self-pay | Admitting: Family Medicine

## 2019-04-07 ENCOUNTER — Other Ambulatory Visit: Payer: Self-pay

## 2019-04-07 VITALS — Ht 72.0 in | Wt 211.0 lb

## 2019-04-07 DIAGNOSIS — I10 Essential (primary) hypertension: Secondary | ICD-10-CM

## 2019-04-07 DIAGNOSIS — I480 Paroxysmal atrial fibrillation: Secondary | ICD-10-CM | POA: Diagnosis not present

## 2019-04-07 DIAGNOSIS — G40909 Epilepsy, unspecified, not intractable, without status epilepticus: Secondary | ICD-10-CM

## 2019-04-07 DIAGNOSIS — R809 Proteinuria, unspecified: Secondary | ICD-10-CM | POA: Diagnosis not present

## 2019-04-07 NOTE — Assessment & Plan Note (Signed)
Reassuring evaluation through nephrology.  I encouraged him to follow-up with them when he is willing to do so.

## 2019-04-07 NOTE — Assessment & Plan Note (Signed)
No recent seizures.  This has been well managed with Keppra.  He will continue to follow with neurology.

## 2019-04-07 NOTE — Assessment & Plan Note (Signed)
Asymptomatic.  We will continue his current regimen.  I discussed having a CBC completed though he defers lab work given the COVID-19 pandemic.  I did discuss the risk of missing anemia that could indicate a bleeding issue though he still deferred.

## 2019-04-07 NOTE — Assessment & Plan Note (Signed)
At goal.  Discussed the need for lab work though he declines having this completed.  I did discuss the risk of missing kidney dysfunction related to the hydrochlorothiazide though given the COVID-19 pandemic he prefers to have lab work done after he receives the vaccine.

## 2019-04-07 NOTE — Progress Notes (Signed)
Virtual Visit via telephone Note  This visit type was conducted due to national recommendations for restrictions regarding the COVID-19 pandemic (e.g. social distancing).  This format is felt to be most appropriate for this patient at this time.  All issues noted in this document were discussed and addressed.  No physical exam was performed (except for noted visual exam findings with Video Visits).   I connected with Shana Chute today at 10:30 AM EST by a video enabled telemedicine application or telephone and verified that I am speaking with the correct person using two identifiers. Location patient: home Location provider: work Persons participating in the virtual visit: patient, provider, Tayten Bergdoll (wife), Proctor Carriker (son)  I discussed the limitations, risks, security and privacy concerns of performing an evaluation and management service by telephone and the availability of in person appointments. I also discussed with the patient that there may be a patient responsible charge related to this service. The patient expressed understanding and agreed to proceed.  Interactive audio and video telecommunications were attempted between this provider and patient, however failed, due to patient having technical difficulties OR patient did not have access to video capability.  We continued and completed visit with audio only.  Reason for visit: Follow-up.  HPI: A. fib: No palpitations.  No bleeding issues.  He is on Eliquis and metoprolol.  Hypertension: Typically 110/70.  Taking HCTZ and metoprolol.  No chest pain, shortness of breath.  Rare pedal edema though he props his legs up and it goes away on its own.  No orthopnea or PND.  Seizure disorder: He has not followed with neurology in greater than a year though his wife does report she has talked to the neurologist several months ago.  He continues on Keppra and has had no additional seizure issues.  Proteinuria: He underwent  evaluation with nephrology.  Their note has been reviewed.  They recommended starting losartan though it does not appear this was completed.  They wanted follow-up in 4 months though the patient has not followed up.   ROS: See pertinent positives and negatives per HPI.  Past Medical History:  Diagnosis Date  . Atrial fibrillation (HCC)   . 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 2011  . Hyperlipidemia   . Hypertension   . Left BB block NEC   . Pacemaker     Past Surgical History:  Procedure Laterality Date  . CARDIAC SURGERY    . CORONARY ARTERY BYPASS GRAFT      Family History  Problem Relation Age of Onset  . Heart disease Father   . Aneurysm Brother        heart    SOCIAL HX: Non-smoker.   Current Outpatient Medications:  .  allopurinol (ZYLOPRIM) 100 MG tablet, TAKE 1 TABLET BY MOUTH ONCE DAILY, Disp: 90 tablet, Rfl: 0 .  apixaban (ELIQUIS) 5 MG TABS tablet, Take 1 tablet (5 mg total) by mouth 2 (two) times daily., Disp: 180 tablet, Rfl: 0 .  cetirizine (ZYRTEC) 10 MG tablet, Take 10 mg by mouth daily as needed. , Disp: , Rfl:  .  fluocinonide-emollient (LIDEX-E) 0.05 % cream, Apply 1 application topically 2 (two) times daily., Disp: 60 g, Rfl: 0 .  FLUoxetine (PROZAC) 20 MG tablet, TAKE 1 TABLET BY MOUTH ONCE DAILY, Disp: 90 tablet, Rfl: 3 .  gabapentin (NEURONTIN) 100 MG capsule, TAKE 2 CAPSULES BY MOUTH TWICE DAILY, Disp: 360 capsule, Rfl: 3 .  hydrochlorothiazide (MICROZIDE) 12.5 MG capsule, TAKE 1 CAPSULE BY MOUTH ONCE DAILY, Disp: 90 capsule, Rfl: 3 .  isosorbide mononitrate (IMDUR) 30 MG 24 hr tablet, TAKE 1 TABLET BY MOUTH ONCE DAILY, Disp: 90 tablet, Rfl: 3 .  levETIRAcetam (KEPPRA XR) 500 MG 24 hr tablet, Take 1,000-1,500 mg by mouth 2 (two) times daily. Take 2 tablets by mouth in the am and 3 tablets by mouth in the pm, Disp: , Rfl:  .  metoprolol succinate (TOPROL-XL) 25 MG 24 hr tablet, Take 1 tablet (25 mg  total) by mouth daily., Disp: 90 tablet, Rfl: 0 .  rosuvastatin (CRESTOR) 40 MG tablet, TAKE 1 TABLET BY MOUTH ONCE DAILY, Disp: 90 tablet, Rfl: 0  EXAM: This was a telehealth telephone visit and thus no physical exam was completed.  ASSESSMENT AND PLAN:  Discussed the following assessment and plan:  Hypertension At goal.  Discussed the need for lab work though he declines having this completed.  I did discuss the risk of missing kidney dysfunction related to the hydrochlorothiazide though given the COVID-19 pandemic he prefers to have lab work done after he receives the vaccine.  Paroxysmal atrial fibrillation (HCC) Asymptomatic.  We will continue his current regimen.  I discussed having a CBC completed though he defers lab work given the COVID-19 pandemic.  I did discuss the risk of missing anemia that could indicate a bleeding issue though he still deferred.  Seizure disorder ([place]) No recent seizures.  This has been well managed with Keppra.  He will continue to follow with neurology.  Proteinuria Reassuring evaluation through nephrology.  I encouraged him to follow-up with them when he is willing to do so.    I discussed the assessment and treatment plan with the patient. The patient was provided an opportunity to ask questions and all were answered. The patient agreed with the plan and demonstrated an understanding of the instructions.   The patient was advised to call back or seek an in-person evaluation if the symptoms worsen or if the condition fails to improve as anticipated.  I provided 18 minutes of non-face-to-face time during this encounter.   Tommi Rumps, MD

## 2019-04-08 ENCOUNTER — Telehealth: Payer: Self-pay | Admitting: Family Medicine

## 2019-04-23 NOTE — Telephone Encounter (Signed)
error 

## 2019-04-25 ENCOUNTER — Other Ambulatory Visit: Payer: Self-pay | Admitting: Family Medicine

## 2019-04-28 ENCOUNTER — Other Ambulatory Visit: Payer: Self-pay | Admitting: Family Medicine

## 2019-05-01 ENCOUNTER — Other Ambulatory Visit: Payer: Self-pay | Admitting: Family Medicine

## 2019-07-09 DIAGNOSIS — I2510 Atherosclerotic heart disease of native coronary artery without angina pectoris: Secondary | ICD-10-CM | POA: Diagnosis not present

## 2019-07-09 DIAGNOSIS — I482 Chronic atrial fibrillation, unspecified: Secondary | ICD-10-CM | POA: Diagnosis not present

## 2019-07-09 DIAGNOSIS — I4430 Unspecified atrioventricular block: Secondary | ICD-10-CM | POA: Diagnosis not present

## 2019-07-09 DIAGNOSIS — Z45018 Encounter for adjustment and management of other part of cardiac pacemaker: Secondary | ICD-10-CM | POA: Diagnosis not present

## 2019-07-09 DIAGNOSIS — I10 Essential (primary) hypertension: Secondary | ICD-10-CM | POA: Diagnosis not present

## 2019-07-09 DIAGNOSIS — Z950 Presence of cardiac pacemaker: Secondary | ICD-10-CM | POA: Diagnosis not present

## 2019-07-09 DIAGNOSIS — Z951 Presence of aortocoronary bypass graft: Secondary | ICD-10-CM | POA: Diagnosis not present

## 2019-07-09 DIAGNOSIS — I442 Atrioventricular block, complete: Secondary | ICD-10-CM | POA: Insufficient documentation

## 2019-07-21 ENCOUNTER — Other Ambulatory Visit (INDEPENDENT_AMBULATORY_CARE_PROVIDER_SITE_OTHER): Payer: Medicare Other

## 2019-07-21 ENCOUNTER — Telehealth: Payer: Self-pay | Admitting: Family Medicine

## 2019-07-21 ENCOUNTER — Other Ambulatory Visit: Payer: Self-pay

## 2019-07-21 DIAGNOSIS — I10 Essential (primary) hypertension: Secondary | ICD-10-CM | POA: Diagnosis not present

## 2019-07-21 DIAGNOSIS — I480 Paroxysmal atrial fibrillation: Secondary | ICD-10-CM

## 2019-07-21 LAB — COMPREHENSIVE METABOLIC PANEL
ALT: 27 U/L (ref 0–53)
AST: 30 U/L (ref 0–37)
Albumin: 4 g/dL (ref 3.5–5.2)
Alkaline Phosphatase: 44 U/L (ref 39–117)
BUN: 29 mg/dL — ABNORMAL HIGH (ref 6–23)
CO2: 30 mEq/L (ref 19–32)
Calcium: 9.2 mg/dL (ref 8.4–10.5)
Chloride: 104 mEq/L (ref 96–112)
Creatinine, Ser: 1.17 mg/dL (ref 0.40–1.50)
GFR: 60.05 mL/min (ref >60.00)
Glucose, Bld: 98 mg/dL (ref 70–99)
Potassium: 4.5 mEq/L (ref 3.5–5.1)
Sodium: 137 mEq/L (ref 135–145)
Total Bilirubin: 0.8 mg/dL (ref 0.2–1.2)
Total Protein: 6.7 g/dL (ref 6.0–8.3)

## 2019-07-21 LAB — CBC
HCT: 41.6 % (ref 39.0–52.0)
Hemoglobin: 13.9 g/dL (ref 13.0–17.0)
MCHC: 33.5 g/dL (ref 30.0–36.0)
MCV: 96.6 fl (ref 78.0–100.0)
Platelets: 114 10*3/uL — ABNORMAL LOW (ref 150.0–400.0)
RBC: 4.3 Mil/uL (ref 4.22–5.81)
RDW: 13.1 % (ref 11.5–15.5)
WBC: 7.5 10*3/uL (ref 4.0–10.5)

## 2019-07-21 LAB — LIPID PANEL
Cholesterol: 123 mg/dL (ref 0–200)
HDL: 44.3 mg/dL (ref >39.00)
LDL Cholesterol: 57 mg/dL (ref 0–99)
NonHDL: 78.55
Total CHOL/HDL Ratio: 3
Triglycerides: 107 mg/dL (ref 0.0–149.0)
VLDL: 21.4 mg/dL (ref 0.0–40.0)

## 2019-07-21 MED ORDER — METOPROLOL SUCCINATE ER 25 MG PO TB24: 25.0000 mg | ORAL_TABLET | Freq: Every day | ORAL | 1 refills | Status: DC

## 2019-07-21 NOTE — Telephone Encounter (Signed)
Patient has changed pharmacy to [facility], [place] in [place], [place]. Patient needs the following refill metoprolol succinate (TOPROL-XL) 25 MG 24 hr tablet, patient only has a few days remaining.

## 2019-07-26 DIAGNOSIS — H43813 Vitreous degeneration, bilateral: Secondary | ICD-10-CM | POA: Diagnosis not present

## 2019-07-26 DIAGNOSIS — H5347 Heteronymous bilateral field defects: Secondary | ICD-10-CM | POA: Diagnosis not present

## 2019-07-26 DIAGNOSIS — H019 Unspecified inflammation of eyelid: Secondary | ICD-10-CM | POA: Diagnosis not present

## 2019-07-26 DIAGNOSIS — H2513 Age-related nuclear cataract, bilateral: Secondary | ICD-10-CM | POA: Diagnosis not present

## 2019-07-26 DIAGNOSIS — H527 Unspecified disorder of refraction: Secondary | ICD-10-CM | POA: Diagnosis not present

## 2019-08-11 DIAGNOSIS — I2510 Atherosclerotic heart disease of native coronary artery without angina pectoris: Secondary | ICD-10-CM | POA: Diagnosis not present

## 2019-08-11 DIAGNOSIS — I4430 Unspecified atrioventricular block: Secondary | ICD-10-CM | POA: Diagnosis not present

## 2019-08-11 DIAGNOSIS — Z951 Presence of aortocoronary bypass graft: Secondary | ICD-10-CM | POA: Diagnosis not present

## 2019-08-11 DIAGNOSIS — I482 Chronic atrial fibrillation, unspecified: Secondary | ICD-10-CM | POA: Diagnosis not present

## 2019-08-24 ENCOUNTER — Telehealth: Payer: Self-pay

## 2019-08-24 NOTE — Telephone Encounter (Signed)
-----   Message from [name], MD sent at 08/23/2019  2:18 PM EDT ----- Noted. I do not see that he was worked up for the low platelets. It looks like he got a platelet transfusion at some point though did not have additional work up for this. I would like to refer him to hematology for evaluation. I can place the referral if he is ok with it.

## 2019-08-25 ENCOUNTER — Other Ambulatory Visit: Payer: Self-pay | Admitting: Family Medicine

## 2019-08-25 DIAGNOSIS — D696 Thrombocytopenia, unspecified: Secondary | ICD-10-CM

## 2019-08-27 ENCOUNTER — Other Ambulatory Visit: Payer: Self-pay | Admitting: Family Medicine

## 2019-08-27 MED ORDER — APIXABAN 5 MG PO TABS: 5.0000 mg | ORAL_TABLET | Freq: Two times a day (BID) | ORAL | 1 refills | Status: DC

## 2019-08-27 MED ORDER — ROSUVASTATIN CALCIUM 40 MG PO TABS: 40.0000 mg | ORAL_TABLET | Freq: Every day | ORAL | 1 refills | Status: DC

## 2019-09-06 ENCOUNTER — Ambulatory Visit (INDEPENDENT_AMBULATORY_CARE_PROVIDER_SITE_OTHER): Payer: Medicare Other

## 2019-09-06 VITALS — Ht 73.0 in | Wt 211.0 lb

## 2019-09-06 DIAGNOSIS — Z0000 Encounter for general adult medical examination without abnormal findings: Secondary | ICD-10-CM | POA: Diagnosis not present

## 2019-09-06 NOTE — Patient Instructions (Addendum)
  Blake Osborne , Thank you for taking time to come for your Medicare Wellness Visit. I appreciate your ongoing commitment to your health goals. Please review the following plan we discussed and let me know if I can assist you in the future.   These are the goals we discussed: Goals    . Increase water intake     Stay hydrated       This is a list of the screening recommended for you and due dates:  Health Maintenance  Topic Date Due  . Tetanus Vaccine  Never done  . Flu Shot  11/14/2019  . COVID-19 Vaccine  Completed  . Pneumonia vaccines  Completed

## 2019-09-06 NOTE — Progress Notes (Signed)
Subjective:   Blake Osborne is a 78 y.o. male who presents for Medicare Annual/Subsequent preventive examination.  Review of Systems:  No ROS.  Medicare Wellness Virtual Visit.  Visual/audio telehealth visit, UTA vital signs.   See social history for additional risk factors.   Cardiac Risk Factors include: advanced age (>31men, >50 women);male gender;hypertension     Objective:    Vitals: Ht 6\' 1"  (1.854 m)   Wt 211 lb (95.7 kg)   BMI 27.84 kg/m   Body mass index is 27.84 kg/m.  Advanced Directives 09/06/2019 09/03/2018 10/07/2016 04/06/2016  Does Patient Have a Medical Advance Directive? Yes Yes Yes Yes  Type of 04/08/2016 of [place];Living will Healthcare Power of [place];Living will Living will;Healthcare Power of Attorney Living will  Does patient want to make changes to medical advance directive? - No - Patient declined No - Patient declined -  Copy of Healthcare Power of Attorney in Chart? No - copy requested No - copy requested No - copy requested -    Tobacco Social History   Tobacco Use  Smoking Status Never Smoker  Smokeless Tobacco Never Used     Counseling given: Not Answered   Clinical Intake:  Pre-visit preparation completed: Yes        Diabetes: No  How often do you need to have someone help you when you read instructions, pamphlets, or other written materials from your doctor or pharmacy?: 3 - Sometimes  Interpreter Needed?: No     Past Medical History:  Diagnosis Date  . Atrial fibrillation (HCC)   . 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 Hyperlipidemia   . Hypertension   . Left BB block NEC   . Pacemaker    Past Surgical History:  Procedure Laterality Date  . CARDIAC SURGERY    . CORONARY ARTERY BYPASS GRAFT     Family History  Problem Relation Age of Onset  . Heart disease Father   . Aneurysm Brother        heart   Social History     Socioeconomic History  . Marital status: Married    Spouse name: Not on file  . Number of children: Not on file  . Years of education: Not on file  . Highest education level: Not on file  Occupational History  . Not on file  Tobacco Use  . Smoking status: Never Smoker  . Smokeless tobacco: Never Used  Substance and Sexual Activity  . Alcohol use: Not Currently    Alcohol/week: 0.0 standard drinks  . Drug use: No  . Sexual activity: Not on file  Other Topics Concern  . Not on file  Social History Narrative  . Not on file   Social Determinants of Health   Financial Resource Strain:   . Difficulty of Paying Living Expenses:   Food Insecurity:   . Worried About 2012 in the Last Year:   . Programme researcher, broadcasting/film/video in the Last Year:   Transportation Needs:   . Barista (Medical):   Freight forwarder Lack of Transportation (Non-Medical):   Physical Activity:   . Days of Exercise per Week:   . Minutes of Exercise per Session:   Stress:   . Feeling of Stress :   Social Connections: Unknown  . Frequency of Communication with Friends and Family: Three times a week  . Frequency of Social Gatherings with Friends and  Family: Three times a week  . Attends Religious Services: Not on file  . Active Member of Clubs or Organizations: Not on file  . Attends Banker Meetings: Not on file  . Marital Status: Married    Outpatient Encounter Medications as of 09/06/2019  Medication Sig  . allopurinol (ZYLOPRIM) 100 MG tablet TAKE 1 TABLET BY MOUTH ONCE DAILY  . apixaban (ELIQUIS) 5 MG TABS tablet Take 1 tablet (5 mg total) by mouth 2 (two) times daily.  . cetirizine (ZYRTEC) 10 MG tablet Take 10 mg by mouth daily as needed.   . fluocinonide-emollient (LIDEX-E) 0.05 % cream Apply 1 application topically 2 (two) times daily.  Marland Kitchen FLUoxetine (PROZAC) 20 MG capsule TAKE 1 CAPSULE BY MOUTH EVERY DAY  . FLUoxetine (PROZAC) 20 MG tablet TAKE 1 TABLET BY MOUTH ONCE DAILY  .  gabapentin (NEURONTIN) 100 MG capsule TAKE 2 CAPSULES BY MOUTH TWICE DAILY  . hydrochlorothiazide (MICROZIDE) 12.5 MG capsule TAKE 1 CAPSULE BY MOUTH ONCE DAILY  . isosorbide mononitrate (IMDUR) 30 MG 24 hr tablet TAKE 1 TABLET BY MOUTH DAILY  . levETIRAcetam (KEPPRA XR) 500 MG 24 hr tablet Take 1,000-1,500 mg by mouth 2 (two) times daily. Take 2 tablets by mouth in the am and 3 tablets by mouth in the pm  . metoprolol succinate (TOPROL-XL) 25 MG 24 hr tablet Take 1 tablet (25 mg total) by mouth daily.  . rosuvastatin (CRESTOR) 40 MG tablet Take 1 tablet (40 mg total) by mouth daily.   No facility-administered encounter medications on file as of 09/06/2019.    Activities of Daily Living In your present state of health, do you have any difficulty performing the following activities: 09/06/2019  Hearing? Y  Vision? N  Difficulty concentrating or making decisions? N  Walking or climbing stairs? N  Dressing or bathing? N  Doing errands, shopping? Y  Preparing Food and eating ? N  Using the Toilet? N  In the past six months, have you accidently leaked urine? N  Do you have problems with loss of bowel control? N  Managing your Medications? Y  Comment Wife assist  Managing your Finances? Y  Comment Wife assist  Housekeeping or managing your Housekeeping? N  Some recent data might be hidden    Patient Care Team: [name], MD as PCP - General (Family Medicine)   Assessment:   This is a routine wellness examination for Blake Osborne.  Nurse connected with Shana Chute and wife Blake Osborne 09/06/19 at  9:00 AM EDT by a telephone enabled telemedicine application, patient has no access to video or has difficulty with video and verified that I am speaking with the correct person using two identifiers. Patient stated full name and DOB. Patient gave permission to continue with virtual visit. Patient's location was at home and Nurse's location was at [place] office.   Patient is alert and  oriented x3.  Health Maintenance Due: -Tdap vaccine- discussed; to be completed with doctor in visit or local pharmacy.   See completed HM at the end of note.   Eye: Visual acuity not assessed. Virtual visit. Followed by their ophthalmologist.  Dental: UTD  Hearing: Hearing aids- yes  Safety:  Patient feels safe at home- yes Patient does have smoke detectors at home- yes Patient does wear sunscreen or protective clothing when in direct sunlight - yes Patient does wear seat belt when in a moving vehicle - yes Patient drives- no Adequate lighting in walkways free from debris-  yes Grab bars and handrails used as appropriate- yes Ambulates with an assistive device- no Cell phone on person when ambulating outside of the home-yes  Social: Alcohol intake - no       Smoking history- never   Smokers in home? none Illicit drug use? none  Medication: Taking as directed and without issues.  Wife manages - yes  Covid-19: Precautions and sickness symptoms discussed. Wears mask, social distancing, hand hygiene as appropriate.   Activities of Daily Living Patient denies needing assistance with: household chores, feeding themselves, getting from bed to chair, getting to the toilet, bathing/showering, or preparing meals.  Wife manages finances.   Discussed the importance of a healthy diet, water intake and the benefits of aerobic exercise.   Physical activity- Stretches daily, assists with household chores.   Diet:  Regular Water: fair intake Caffeine: 1 cup of coffee  Other Providers Patient Care Team: Leone Haven, MD as PCP - General (Family Medicine)  Exercise Activities and Dietary recommendations Current Exercise Habits: Home exercise routine, Type of exercise: stretching, Intensity: Mild  Goals    . Increase water intake     Stay hydrated       Fall Risk Fall Risk  09/06/2019 09/03/2018 03/04/2018 10/07/2016 11/07/2015  Falls in the past year? 0 0 0 No No    Comment - - Emmi Telephone Survey: data to providers prior to load - -  Follow up Falls evaluation completed - - - -   Timed Get Up and Go Performed: no, virtual visit  Depression Screen PHQ 2/9 Scores 09/06/2019 09/03/2018 10/07/2016 11/07/2015  PHQ - 2 Score 0 0 0 0  PHQ- 9 Score - - 0 -    Cognitive Function MMSE - Mini Mental State Exam 09/06/2019 10/07/2016  Not completed: Unable to complete -  Orientation to time - 5  Orientation to Place - 5  Registration - 3  Attention/ Calculation - 5  Recall - 3  Language- name 2 objects - 2  Language- repeat - 1  Language- follow 3 step command - 3  Language- read & follow direction - 1  Write a sentence - 1  Copy design - 1  Total score - 30     6CIT Screen 09/03/2018  What Year? 0 points  What month? 0 points  What time? 0 points  Count back from 20 0 points  Months in reverse 0 points  Repeat phrase 0 points  Total Score 0    Immunization History  Administered Date(s) Administered  . Influenza Split 01/14/2011, 01/16/2012, 02/08/2014  . Influenza, High Dose Seasonal PF 01/05/2019  . Influenza-Unspecified 03/15/2015, 01/01/2018  . PFIZER SARS-COV-2 Vaccination 05/07/2019, 05/28/2019  . Pneumococcal Conjugate-13 02/15/2014  . Pneumococcal Polysaccharide-23 06/15/2008  . Zoster 06/16/2010   Screening Tests Health Maintenance  Topic Date Due  . TETANUS/TDAP  Never done  . INFLUENZA VACCINE  11/14/2019  . COVID-19 Vaccine  Completed  . PNA vac Low Risk Adult  Completed       Plan:   Keep all routine maintenance appointments.   Follow up 09/20/19 @ 10:00  Medicare Attestation I have personally reviewed: The patient's medical and social history Their use of alcohol, tobacco or illicit drugs Their current medications and supplements The patient's functional ability including ADLs,fall risks, home safety risks, cognitive, and hearing and visual impairment Diet and physical activities Evidence for depression   I  have reviewed and discussed with patient certain preventive protocols, quality metrics, and best practice recommendations.  Ashok Pall, LPN  12/12/5619

## 2019-09-06 NOTE — Progress Notes (Signed)
I have reviewed the above note and agree.  Tequan Redmon, M.D.  

## 2019-09-14 ENCOUNTER — Inpatient Hospital Stay: Payer: Medicare Other | Attending: Oncology | Admitting: Oncology

## 2019-09-14 DIAGNOSIS — I2510 Atherosclerotic heart disease of native coronary artery without angina pectoris: Secondary | ICD-10-CM | POA: Diagnosis not present

## 2019-09-14 DIAGNOSIS — I10 Essential (primary) hypertension: Secondary | ICD-10-CM | POA: Diagnosis not present

## 2019-09-14 DIAGNOSIS — I480 Paroxysmal atrial fibrillation: Secondary | ICD-10-CM | POA: Diagnosis not present

## 2019-09-14 DIAGNOSIS — I4430 Unspecified atrioventricular block: Secondary | ICD-10-CM | POA: Diagnosis not present

## 2019-09-14 DIAGNOSIS — Z951 Presence of aortocoronary bypass graft: Secondary | ICD-10-CM | POA: Diagnosis not present

## 2019-09-14 DIAGNOSIS — E785 Hyperlipidemia, unspecified: Secondary | ICD-10-CM | POA: Diagnosis not present

## 2019-09-20 ENCOUNTER — Other Ambulatory Visit: Payer: Self-pay

## 2019-09-20 ENCOUNTER — Ambulatory Visit (INDEPENDENT_AMBULATORY_CARE_PROVIDER_SITE_OTHER): Payer: Medicare Other | Admitting: Family Medicine

## 2019-09-20 ENCOUNTER — Encounter: Payer: Self-pay | Admitting: Family Medicine

## 2019-09-20 VITALS — BP 120/80 | HR 80 | Temp 96.2°F | Ht 72.0 in | Wt 214.0 lb

## 2019-09-20 DIAGNOSIS — D696 Thrombocytopenia, unspecified: Secondary | ICD-10-CM | POA: Diagnosis not present

## 2019-09-20 DIAGNOSIS — G40909 Epilepsy, unspecified, not intractable, without status epilepticus: Secondary | ICD-10-CM | POA: Diagnosis not present

## 2019-09-20 DIAGNOSIS — I10 Essential (primary) hypertension: Secondary | ICD-10-CM

## 2019-09-20 DIAGNOSIS — F419 Anxiety disorder, unspecified: Secondary | ICD-10-CM | POA: Diagnosis not present

## 2019-09-20 MED ORDER — TETANUS-DIPHTHERIA TOXOIDS TD 5-2 LFU IM INJ: 0.5000 mL | INJECTION | Freq: Once | INTRAMUSCULAR | 0 refills | Status: AC

## 2019-09-20 MED ORDER — FLUOXETINE HCL 40 MG PO CAPS: 40.0000 mg | ORAL_CAPSULE | Freq: Every day | ORAL | 1 refills | Status: DC

## 2019-09-20 NOTE — Progress Notes (Signed)
  [name], MD Phone: [phone]  Blake Osborne is a 79 y.o. male who presents today for f/u.  HYPERTENSION  Disease Monitoring  Home BP Monitoring not checking Chest pain- no    Dyspnea- no Medications  Compliance-  Taking HCTZ, imdur, metoprolol.  Edema- chronic in his right ankle  History of seizures: Currently on Keppra managed by neurology.  No recent seizure activity.  Anxiety: Patient notes this is worsened recently.  Has not been hearing well and he has hearing aids adjusted and thinks that may help.  They have a lot going on at home with issues with neighbors and that has been stress provoking as well.  The patient would like to go up on his Prozac.    Social History   Tobacco Use  Smoking Status Never Smoker  Smokeless Tobacco Never Used     ROS see history of present illness  Objective  Physical Exam Vitals:   09/20/19 1019  BP: 120/80  Pulse: 80  Temp: (!) 96.2 F (35.7 C)  SpO2: 93%    BP Readings from Last 3 Encounters:  09/20/19 120/80  05/29/18 106/70  08/20/17 112/66   Wt Readings from Last 3 Encounters:  09/20/19 214 lb (97.1 kg)  09/06/19 211 lb (95.7 kg)  04/07/19 211 lb (95.7 kg)    Physical Exam Constitutional:      General: He is not in acute distress.    Appearance: He is not diaphoretic.  Cardiovascular:     Rate and Rhythm: Normal rate and regular rhythm.     Heart sounds: Normal heart sounds.  Pulmonary:     Effort: Pulmonary effort is normal.     Breath sounds: Normal breath sounds.  Skin:    General: Skin is warm and dry.  Neurological:     Mental Status: He is alert.      Assessment/Plan: Please see individual problem list.  Hypertension Chronic issue.  Well-controlled.  Continue current medication.  Seizure disorder (HCC) No recent seizure activity.  He will continue to see neurology.  Anxiety Worsened recently.  We will increase his Prozac.  Follow-up in 2 to 3 months.  Thrombocytopenia  (HCC) Refer to hematology. Missed prior appointment.    Orders Placed This Encounter  Procedures  . Ambulatory referral to Hematology    Referral Priority:   Routine    Referral Type:   Consultation    Referral Reason:   Specialty Services Required    Requested Specialty:   Oncology    Number of Visits Requested:   1    Meds ordered this encounter  Medications  . FLUoxetine (PROZAC) 40 MG capsule    Sig: Take 1 capsule (40 mg total) by mouth daily.    Dispense:  90 capsule    Refill:  1  . tetanus & diphtheria toxoids, adult, (TENIVAC) 5-2 LFU injection    Sig: Inject 0.5 mLs into the muscle once for 1 dose.    Dispense:  0.5 mL    Refill:  0    This visit occurred during the SARS-CoV-2 public health emergency.  Safety protocols were in place, including screening questions prior to the visit, additional usage of staff PPE, and extensive cleaning of exam room while observing appropriate contact time as indicated for disinfecting solutions.    [name], MD [facility]

## 2019-09-20 NOTE — Assessment & Plan Note (Signed)
Refer to hematology. Missed prior appointment.

## 2019-09-20 NOTE — Assessment & Plan Note (Signed)
Worsened recently.  We will increase his Prozac.  Follow-up in 2 to 3 months.

## 2019-09-20 NOTE — Assessment & Plan Note (Signed)
>>  ASSESSMENT AND PLAN FOR ANXIETY WRITTEN ON 09/20/2019 10:31 AM BY MARIBETH, ERIC G, MD  Worsened recently.  We will increase his Prozac .  Follow-up in 2 to 3 months.

## 2019-09-20 NOTE — Patient Instructions (Signed)
Nice to see you. We will increase your Prozac. I will see you back in 2 to 3 months. You need to see hematology.  We will refer you again.

## 2019-09-20 NOTE — Assessment & Plan Note (Signed)
No recent seizure activity.  He will continue to see neurology.

## 2019-09-20 NOTE — Assessment & Plan Note (Signed)
Chronic issue.  Well-controlled.  Continue current medication.

## 2019-10-01 ENCOUNTER — Inpatient Hospital Stay: Payer: Medicare Other | Attending: Oncology | Admitting: Oncology

## 2019-10-01 ENCOUNTER — Inpatient Hospital Stay: Payer: Medicare Other

## 2019-10-01 ENCOUNTER — Other Ambulatory Visit: Payer: Self-pay

## 2019-10-01 ENCOUNTER — Encounter: Payer: Self-pay | Admitting: Oncology

## 2019-10-01 VITALS — BP 97/61 | HR 72 | Temp 96.2°F | Resp 18 | Wt 214.5 lb

## 2019-10-01 DIAGNOSIS — I4891 Unspecified atrial fibrillation: Secondary | ICD-10-CM | POA: Diagnosis not present

## 2019-10-01 DIAGNOSIS — D696 Thrombocytopenia, unspecified: Secondary | ICD-10-CM

## 2019-10-01 DIAGNOSIS — I2583 Coronary atherosclerosis due to lipid rich plaque: Secondary | ICD-10-CM | POA: Insufficient documentation

## 2019-10-01 DIAGNOSIS — I2510 Atherosclerotic heart disease of native coronary artery without angina pectoris: Secondary | ICD-10-CM | POA: Insufficient documentation

## 2019-10-01 DIAGNOSIS — E785 Hyperlipidemia, unspecified: Secondary | ICD-10-CM | POA: Insufficient documentation

## 2019-10-01 DIAGNOSIS — Z7901 Long term (current) use of anticoagulants: Secondary | ICD-10-CM | POA: Insufficient documentation

## 2019-10-01 DIAGNOSIS — I10 Essential (primary) hypertension: Secondary | ICD-10-CM | POA: Diagnosis not present

## 2019-10-01 LAB — CBC WITH DIFFERENTIAL/PLATELET
Abs Immature Granulocytes: 0.08 10*3/uL — ABNORMAL HIGH (ref 0.00–0.07)
Basophils Absolute: 0.1 10*3/uL (ref 0.0–0.1)
Basophils Relative: 1 %
Eosinophils Absolute: 0.9 10*3/uL — ABNORMAL HIGH (ref 0.0–0.5)
Eosinophils Relative: 9 %
HCT: 44.1 % (ref 39.0–52.0)
Hemoglobin: 14.6 g/dL (ref 13.0–17.0)
Immature Granulocytes: 1 %
Lymphocytes Relative: 20 %
Lymphs Abs: 1.8 10*3/uL (ref 0.7–4.0)
MCH: 31.2 pg (ref 26.0–34.0)
MCHC: 33.1 g/dL (ref 30.0–36.0)
MCV: 94.2 fL (ref 80.0–100.0)
Monocytes Absolute: 0.9 10*3/uL (ref 0.1–1.0)
Monocytes Relative: 10 %
Neutro Abs: 5.4 10*3/uL (ref 1.7–7.7)
Neutrophils Relative %: 59 %
Platelets: 116 10*3/uL — ABNORMAL LOW (ref 150–400)
RBC: 4.68 MIL/uL (ref 4.22–5.81)
RDW: 12.2 % (ref 11.5–15.5)
WBC: 9.1 10*3/uL (ref 4.0–10.5)
nRBC: 0 % (ref 0.0–0.2)

## 2019-10-01 LAB — COMPREHENSIVE METABOLIC PANEL
ALT: 34 U/L (ref 0–44)
AST: 48 U/L — ABNORMAL HIGH (ref 15–41)
Albumin: 4.3 g/dL (ref 3.5–5.0)
Alkaline Phosphatase: 54 U/L (ref 38–126)
Anion gap: 9 (ref 5–15)
BUN: 33 mg/dL — ABNORMAL HIGH (ref 8–23)
CO2: 30 mmol/L (ref 22–32)
Calcium: 9.1 mg/dL (ref 8.9–10.3)
Chloride: 100 mmol/L (ref 98–111)
Creatinine, Ser: 1.49 mg/dL — ABNORMAL HIGH (ref 0.61–1.24)
GFR calc Af Amer: 51 mL/min — ABNORMAL LOW (ref >60)
GFR calc non Af Amer: 44 mL/min — ABNORMAL LOW (ref >60)
Glucose, Bld: 98 mg/dL (ref 70–99)
Potassium: 4.3 mmol/L (ref 3.5–5.1)
Sodium: 139 mmol/L (ref 135–145)
Total Bilirubin: 1 mg/dL (ref 0.3–1.2)
Total Protein: 7.6 g/dL (ref 6.5–8.1)

## 2019-10-01 LAB — VITAMIN B12: Vitamin B-12: 357 pg/mL (ref 180–914)

## 2019-10-01 LAB — HEPATITIS PANEL, ACUTE
HCV Ab: NONREACTIVE
Hep A IgM: NONREACTIVE
Hep B C IgM: NONREACTIVE
Hepatitis B Surface Ag: NONREACTIVE

## 2019-10-01 LAB — IMMATURE PLATELET FRACTION: Immature Platelet Fraction: 12.6 % — ABNORMAL HIGH (ref 1.2–8.6)

## 2019-10-01 LAB — RETIC PANEL
Immature Retic Fract: 9.5 % (ref 2.3–15.9)
RBC.: 4.68 MIL/uL (ref 4.22–5.81)
Retic Count, Absolute: 54.8 10*3/uL (ref 19.0–186.0)
Retic Ct Pct: 1.2 % (ref 0.4–3.1)
Reticulocyte Hemoglobin: 35.3 pg (ref >27.9)

## 2019-10-01 LAB — TECHNOLOGIST SMEAR REVIEW: Plt Morphology: DECREASED

## 2019-10-01 LAB — FOLATE: Folate: 25 ng/mL (ref >5.9)

## 2019-10-01 LAB — HIV ANTIBODY (ROUTINE TESTING W REFLEX): HIV Screen 4th Generation wRfx: NONREACTIVE

## 2019-10-01 NOTE — Progress Notes (Signed)
Hematology/Oncology Consult note [facility] [phone] [phone]   Patient Care Team: Leone Haven, MD as PCP - General (Family Medicine)  REFERRING PROVIDER: Leone Haven, MD  CHIEF COMPLAINTS/REASON FOR VISIT:  Evaluation of thrombocytopenia  HISTORY OF PRESENTING ILLNESS:  Blake Osborne is a 79 y.o. male who was seen in consultation at the request of Caryl Bis, Angela Adam, MD for evaluation of    Reviewed patient's labs done previously.  4 7 labs showed decreased platelet counts at 1 14,000. wbc 7.5 hemoglobin 13.9. Reviewed patient's previous labs. 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 No aggravating or elevated factors.  Associated symptoms or signs:  Denies weight loss, fever, chills, fatigue, night sweats.  Denies hematochezia, hematuria, hematemesis, epistaxis, black tarry stool.  easy bruising.   Context:  History of hepatitis or HIV infection, denies History of gastric bypass: denies History of autoimmune disease:  Denies  History of coronary artery disease, status post CABG.  Atrial fibrillation on Eliquis.  Review of Systems  Constitutional: Negative for appetite change, chills, fatigue, fever and unexpected weight change.  HENT:   Negative for hearing loss and voice change.   Eyes: Negative for eye problems and icterus.  Respiratory: Negative for chest tightness, cough and shortness of breath.   Cardiovascular: Negative for chest pain and leg swelling.  Gastrointestinal: Negative for abdominal distention and abdominal pain.  Endocrine: Negative for hot flashes.  Genitourinary: Negative for difficulty urinating, dysuria and frequency.   Musculoskeletal: Negative for arthralgias.  Skin: Negative for itching and rash.  Neurological: Negative for light-headedness and numbness.  Hematological: Negative for adenopathy. Does not bruise/bleed easily.    Psychiatric/Behavioral: Negative for confusion.    MEDICAL HISTORY:  Past Medical History:  Diagnosis Date  . Atrial fibrillation ([place])   . 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 2011  . Hyperlipidemia   . Hypertension   . Left BB block NEC   . Pacemaker     SURGICAL HISTORY: Past Surgical History:  Procedure Laterality Date  . CARDIAC SURGERY    . CORONARY ARTERY BYPASS GRAFT    . PACEMAKER GENERATOR CHANGE      SOCIAL HISTORY: Social History   Socioeconomic History  . Marital status: Married    Spouse name: Not on file  . Number of children: Not on file  . Years of education: Not on file  . Highest education level: Not on file  Occupational History  . Not on file  Tobacco Use  . Smoking status: Former Smoker    Years: 8.00    Quit date: 10/01/1959    Years since quitting: 60.0  . Smokeless tobacco: Never Used  Vaping Use  . Vaping Use: Never used  Substance and Sexual Activity  . Alcohol use: Not Currently    Alcohol/week: 1.0 standard drink    Types: 1 Glasses of wine per week    Comment: occasional glass of wine  . Drug use: No  . Sexual activity: Not on file  Other Topics Concern  . Not on file  Social History Narrative  . Not on file   Social Determinants of Health   Financial Resource Strain:   . Difficulty of Paying Living Expenses:   Food Insecurity:   . Worried About Charity fundraiser in the Last Year:   . Arboriculturist in the Last Year:   Transportation Needs:   .  Lack of Transportation (Medical):   Marland Kitchen Lack of Transportation (Non-Medical):   Physical Activity:   . Days of Exercise per Week:   . Minutes of Exercise per Session:   Stress:   . Feeling of Stress :   Social Connections: Unknown  . Frequency of Communication with Friends and Family: Three times a week  . Frequency of Social Gatherings with Friends and Family: Three times a week  . Attends Religious Services: Not on file   . Active Member of Clubs or Organizations: Not on file  . Attends Archivist Meetings: Not on file  . Marital Status: Married  Human resources officer Violence:   . Fear of Current or Ex-Partner:   . Emotionally Abused:   Marland Kitchen Physically Abused:   . Sexually Abused:     FAMILY HISTORY: Family History  Problem Relation Age of Onset  . Heart disease Father   . Heart attack Father   . Aneurysm Brother        heart    ALLERGIES:  is allergic to augmentin [amoxicillin-pot clavulanate], dust mite extract, other, and pollen extract.  MEDICATIONS:  Current Outpatient Medications  Medication Sig Dispense Refill  . apixaban (ELIQUIS) 5 MG TABS tablet Take 1 tablet (5 mg total) by mouth 2 (two) times daily. 180 tablet 1  . cetirizine (ZYRTEC) 10 MG tablet Take 10 mg by mouth daily as needed.     Marland Kitchen FLUoxetine (PROZAC) 40 MG capsule Take 1 capsule (40 mg total) by mouth daily. 90 capsule 1  . gabapentin (NEURONTIN) 100 MG capsule TAKE 2 CAPSULES BY MOUTH TWICE DAILY 360 capsule 2  . hydrochlorothiazide (MICROZIDE) 12.5 MG capsule TAKE 1 CAPSULE BY MOUTH ONCE DAILY 90 capsule 2  . isosorbide mononitrate (IMDUR) 30 MG 24 hr tablet TAKE 1 TABLET BY MOUTH DAILY 90 tablet 2  . levETIRAcetam (KEPPRA XR) 500 MG 24 hr tablet Take 1,000-1,500 mg by mouth 2 (two) times daily. Take 2 tablets by mouth in the am and 3 tablets by mouth in the pm    . metoprolol succinate (TOPROL-XL) 25 MG 24 hr tablet Take 1 tablet (25 mg total) by mouth daily. 90 tablet 1  . rosuvastatin (CRESTOR) 40 MG tablet Take 1 tablet (40 mg total) by mouth daily. 90 tablet 1  . fluocinonide-emollient (LIDEX-E) 0.05 % cream Apply 1 application topically 2 (two) times daily. (Patient not taking: Reported on 10/01/2019) 60 g 0   No current facility-administered medications for this visit.     PHYSICAL EXAMINATION: ECOG PERFORMANCE STATUS: 0 - Asymptomatic Vitals:   10/01/19 1102  BP: 97/61  Pulse: 72  Resp: 18  Temp: (!)  96.2 F (35.7 C)   Filed Weights   10/01/19 1102  Weight: 214 lb 8 oz (97.3 kg)    Physical Exam Constitutional:      General: He is not in acute distress. HENT:     Head: Normocephalic and atraumatic.  Eyes:     General: No scleral icterus. Cardiovascular:     Rate and Rhythm: Normal rate and regular rhythm.     Heart sounds: Normal heart sounds.  Pulmonary:     Effort: Pulmonary effort is normal. No respiratory distress.     Breath sounds: No wheezing.  Abdominal:     General: Bowel sounds are normal. There is no distension.     Palpations: Abdomen is soft.  Musculoskeletal:        General: No deformity. Normal range of motion.  Cervical back: Normal range of motion and neck supple.  Skin:    General: Skin is warm and dry.     Findings: No erythema or rash.  Neurological:     Mental Status: He is alert and oriented to person, place, and time. Mental status is at baseline.     Cranial Nerves: No cranial nerve deficit.     Coordination: Coordination normal.  Psychiatric:        Mood and Affect: Mood normal.      LABORATORY DATA:  I have reviewed the data as listed Lab Results  Component Value Date   WBC 9.1 10/01/2019   HGB 14.6 10/01/2019   HCT 44.1 10/01/2019   MCV 94.2 10/01/2019   PLT 116 (L) 10/01/2019   Recent Labs    07/21/19 0952 10/01/19 1155  NA 137 139  K 4.5 4.3  CL 104 100  CO2 30 30  GLUCOSE 98 98  BUN 29* 33*  CREATININE 1.17 1.49*  CALCIUM 9.2 9.1  GFRNONAA  --  44*  GFRAA  --  51*  PROT 6.7 7.6  ALBUMIN 4.0 4.3  AST 30 48*  ALT 27 34  ALKPHOS 44 54  BILITOT 0.8 1.0   Iron/TIBC/Ferritin/ %Sat No results found for: IRON, TIBC, FERRITIN, IRONPCTSAT    RADIOGRAPHIC STUDIES: I have personally reviewed the radiological images as listed and agreed with the findings in the report.  No results found.   ASSESSMENT & PLAN:  1. Thrombocytopenia ([place])     For the work up of patient's thrombocytopenia, I recommend checking  CBC;CMP, LDH; smear review, folate, Vitamin B12, hepatitis, HIV, ANA,  flowcytometry and monoclonal gammopathy workup.  check ultrasound of the abdomen.  Also, discussed with the patient that if no clear etiology found- bone marrow biopsy would be suggested. Currently await for the above workup.   # Patient follow-up with me in approximately 2 weeks to review the above results.   Orders Placed This Encounter  Procedures  . Folate    Standing Status:   Future    Number of Occurrences:   1    Standing Expiration Date:   09/30/2020  . Vitamin B12    Standing Status:   Future    Number of Occurrences:   1    Standing Expiration Date:   09/30/2020  . CBC with Differential/Platelet    Standing Status:   Future    Number of Occurrences:   1    Standing Expiration Date:   09/30/2020  . Comprehensive metabolic panel    Standing Status:   Future    Number of Occurrences:   1    Standing Expiration Date:   09/30/2020  . Retic Panel    Standing Status:   Future    Number of Occurrences:   1    Standing Expiration Date:   09/30/2020  . Technologist smear review    Standing Status:   Future    Number of Occurrences:   1    Standing Expiration Date:   09/30/2020  . Flow cytometry panel-leukemia/lymphoma work-up    Standing Status:   Future    Number of Occurrences:   1    Standing Expiration Date:   09/30/2020  . ANA, IFA (with reflex)    Standing Status:   Future    Number of Occurrences:   1    Standing Expiration Date:   09/30/2020  . Immature Platelet Fraction    Standing Status:   Future  Number of Occurrences:   1    Standing Expiration Date:   09/30/2020  . Hepatitis panel, acute    Standing Status:   Future    Number of Occurrences:   1    Standing Expiration Date:   09/30/2020  . HIV Antibody (routine testing w rflx)    Standing Status:   Future    Number of Occurrences:   1    Standing Expiration Date:   09/30/2020  . Protein electrophoresis, serum    Standing Status:   Future     Number of Occurrences:   1    Standing Expiration Date:   09/30/2020    All questions were answered. The patient knows to call the clinic with any problems questions or concerns.  Cc Leone Haven, MD  Return of visit: 2 weeks.  Thank you for this kind referral and the opportunity to participate in the care of this patient. A copy of today's note is routed to referring provider    Earlie Server, MD, PhD 10/01/2019

## 2019-10-01 NOTE — Progress Notes (Signed)
Pt here to establish care for thrombocytopenia. °

## 2019-10-03 LAB — PROTEIN ELECTROPHORESIS, SERUM
A/G Ratio: 1.2 (ref 0.7–1.7)
Albumin ELP: 3.8 g/dL (ref 2.9–4.4)
Alpha-1-Globulin: 0.2 g/dL (ref 0.0–0.4)
Alpha-2-Globulin: 0.8 g/dL (ref 0.4–1.0)
Beta Globulin: 1.2 g/dL (ref 0.7–1.3)
Gamma Globulin: 1.1 g/dL (ref 0.4–1.8)
Globulin, Total: 3.2 g/dL (ref 2.2–3.9)
Total Protein ELP: 7 g/dL (ref 6.0–8.5)

## 2019-10-04 LAB — ANTINUCLEAR ANTIBODIES, IFA: ANA Ab, IFA: NEGATIVE

## 2019-10-08 DIAGNOSIS — Z45018 Encounter for adjustment and management of other part of cardiac pacemaker: Secondary | ICD-10-CM | POA: Diagnosis not present

## 2019-10-08 DIAGNOSIS — I4430 Unspecified atrioventricular block: Secondary | ICD-10-CM | POA: Diagnosis not present

## 2019-10-08 DIAGNOSIS — Z950 Presence of cardiac pacemaker: Secondary | ICD-10-CM | POA: Diagnosis not present

## 2019-10-08 LAB — COMP PANEL: LEUKEMIA/LYMPHOMA

## 2019-10-15 ENCOUNTER — Telehealth: Payer: Self-pay | Admitting: Family Medicine

## 2019-10-15 ENCOUNTER — Inpatient Hospital Stay: Payer: Medicare Other | Attending: Oncology | Admitting: Oncology

## 2019-10-15 ENCOUNTER — Encounter: Payer: Self-pay | Admitting: Oncology

## 2019-10-15 ENCOUNTER — Other Ambulatory Visit: Payer: Self-pay

## 2019-10-15 VITALS — BP 107/71 | HR 86 | Temp 97.8°F | Resp 16 | Wt 211.7 lb

## 2019-10-15 DIAGNOSIS — E785 Hyperlipidemia, unspecified: Secondary | ICD-10-CM | POA: Diagnosis not present

## 2019-10-15 DIAGNOSIS — Z7901 Long term (current) use of anticoagulants: Secondary | ICD-10-CM | POA: Diagnosis not present

## 2019-10-15 DIAGNOSIS — Z87891 Personal history of nicotine dependence: Secondary | ICD-10-CM | POA: Diagnosis not present

## 2019-10-15 DIAGNOSIS — I4891 Unspecified atrial fibrillation: Secondary | ICD-10-CM | POA: Insufficient documentation

## 2019-10-15 DIAGNOSIS — Z8249 Family history of ischemic heart disease and other diseases of the circulatory system: Secondary | ICD-10-CM | POA: Insufficient documentation

## 2019-10-15 DIAGNOSIS — D696 Thrombocytopenia, unspecified: Secondary | ICD-10-CM | POA: Insufficient documentation

## 2019-10-15 DIAGNOSIS — I10 Essential (primary) hypertension: Secondary | ICD-10-CM | POA: Insufficient documentation

## 2019-10-15 DIAGNOSIS — R7989 Other specified abnormal findings of blood chemistry: Secondary | ICD-10-CM

## 2019-10-15 DIAGNOSIS — Z79899 Other long term (current) drug therapy: Secondary | ICD-10-CM | POA: Insufficient documentation

## 2019-10-15 DIAGNOSIS — I2510 Atherosclerotic heart disease of native coronary artery without angina pectoris: Secondary | ICD-10-CM | POA: Insufficient documentation

## 2019-10-15 DIAGNOSIS — N179 Acute kidney failure, unspecified: Secondary | ICD-10-CM

## 2019-10-15 NOTE — Telephone Encounter (Signed)
-----   Message from Rickard Patience, MD sent at 10/15/2019 12:41 PM EDT ----- Dear Dr. Bobette Mo had the pleasure to see Blake Osborne for thrombocytopenia.  Incidental finding of creatinine increased to 1.4, higher than his baseline.  He denies any NSAID use.  I asked patient to increase oral hydration and follow-up with you for a lab encounter 1 to 2 weeks.  Thank youRegarding to his thrombocytopenia, likely peripheral destruction.  I will see him for follow-up in 4 months.Rickard Patience

## 2019-10-15 NOTE — Telephone Encounter (Signed)
I called and scheduled the patient for labs to recheck his kidney function.  Adan Beal,cma

## 2019-10-15 NOTE — Progress Notes (Signed)
Patient denies new problems/concerns today.   °

## 2019-10-15 NOTE — Progress Notes (Signed)
Hematology/Oncology follow up  note [facility] [phone] [phone]   Patient Care Team: [name], MD as PCP - General (Family Medicine)  REFERRING PROVIDER: [name], MD  CHIEF COMPLAINTS/REASON FOR VISIT:  Follow up  thrombocytopenia  HISTORY OF PRESENTING ILLNESS:  Blake Osborne is a 79 y.o. male who was seen in consultation at the request of Birdie Sons Yehuda Mao, MD for evaluation of thrombocytopenia   Reviewed patient's labs done previously.  4 7 labs showed decreased platelet counts at 114,000. wbc 7.5 hemoglobin 13.9. Reviewed patient's previous labs. 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 No aggravating or elevated factors.  Associated symptoms or signs:  Denies weight loss, fever, chills, fatigue, night sweats.  Denies hematochezia, hematuria, hematemesis, epistaxis, black tarry stool.  easy bruising.   Context:  History of hepatitis or HIV infection, denies History of gastric bypass: denies History of autoimmune disease:  Denies  History of coronary artery disease, status post CABG.  Atrial fibrillation on Eliquis.  + Forgetful per wife. Patient also takes Keppra  INTERVAL HISTORY Blake Osborne is a 79 y.o. male who has above history reviewed by me today presents for follow up visit for thrombocytopenia Problems and complaints are listed below: Patient has had blood work up done and presents to discuss results.  Patient was accompanied by his wife.   Review of Systems  Constitutional: Negative for appetite change, chills, fatigue, fever and unexpected weight change.  HENT:   Positive for hearing loss. Negative for voice change.   Eyes: Negative for eye problems and icterus.  Respiratory: Negative for chest tightness, cough and shortness of breath.   Cardiovascular: Negative for chest pain and leg swelling.  Gastrointestinal: Negative for  abdominal distention and abdominal pain.  Endocrine: Negative for hot flashes.  Genitourinary: Negative for difficulty urinating, dysuria and frequency.   Musculoskeletal: Negative for arthralgias.  Skin: Negative for itching and rash.  Neurological: Negative for light-headedness and numbness.  Hematological: Negative for adenopathy. Does not bruise/bleed easily.  Psychiatric/Behavioral: Negative for confusion.       Forgetful    MEDICAL HISTORY:  Past Medical History:  Diagnosis Date  . Atrial fibrillation (HCC)   . Coronary arteriosclerosis due to lipid rich plaque    s/p 3vessel CABG at [facility] 2011  . Hyperlipidemia   . Hypertension   . Left BB block NEC   . Pacemaker     SURGICAL HISTORY: Past Surgical History:  Procedure Laterality Date  . CARDIAC SURGERY    . CORONARY ARTERY BYPASS GRAFT    . PACEMAKER GENERATOR CHANGE      SOCIAL HISTORY: Social History   Socioeconomic History  . Marital status: Married    Spouse name: Not on file  . Number of children: Not on file  . Years of education: Not on file  . Highest education level: Not on file  Occupational History  . Not on file  Tobacco Use  . Smoking status: Former Smoker    Years: 8.00    Quit date: 10/01/1959    Years since quitting: 60.0  . Smokeless tobacco: Never Used  Vaping Use  . Vaping Use: Never used  Substance and Sexual Activity  . Alcohol use: Not Currently    Alcohol/week: 1.0 standard drink    Types: 1 Glasses of wine per week    Comment: occasional glass of wine  . Drug use: No  . Sexual activity: Not on file  Other Topics Concern  . Not on file  Social History Narrative  . Not on file   Social Determinants of Health   Financial Resource Strain:   . Difficulty of Paying Living Expenses:   Food Insecurity:   . Worried About Programme researcher, broadcasting/film/video in the Last Year:   . Barista in the Last Year:   Transportation Needs:   . Freight forwarder (Medical):   Marland Kitchen Lack  of Transportation (Non-Medical):   Physical Activity:   . Days of Exercise per Week:   . Minutes of Exercise per Session:   Stress:   . Feeling of Stress :   Social Connections: Unknown  . Frequency of Communication with Friends and Family: Three times a week  . Frequency of Social Gatherings with Friends and Family: Three times a week  . Attends Religious Services: Not on file  . Active Member of Clubs or Organizations: Not on file  . Attends Banker Meetings: Not on file  . Marital Status: Married  Catering manager Violence:   . Fear of Current or Ex-Partner:   . Emotionally Abused:   Marland Kitchen Physically Abused:   . Sexually Abused:     FAMILY HISTORY: Family History  Problem Relation Age of Onset  . Heart disease Father   . Heart attack Father   . Aneurysm Brother        heart    ALLERGIES:  is allergic to augmentin [amoxicillin-pot clavulanate], dust mite extract, other, and pollen extract.  MEDICATIONS:  Current Outpatient Medications  Medication Sig Dispense Refill  . apixaban (ELIQUIS) 5 MG TABS tablet Take 1 tablet (5 mg total) by mouth 2 (two) times daily. 180 tablet 1  . cetirizine (ZYRTEC) 10 MG tablet Take 10 mg by mouth daily as needed.     Marland Kitchen FLUoxetine (PROZAC) 40 MG capsule Take 1 capsule (40 mg total) by mouth daily. 90 capsule 1  . gabapentin (NEURONTIN) 100 MG capsule TAKE 2 CAPSULES BY MOUTH TWICE DAILY 360 capsule 2  . hydrochlorothiazide (MICROZIDE) 12.5 MG capsule TAKE 1 CAPSULE BY MOUTH ONCE DAILY 90 capsule 2  . isosorbide mononitrate (IMDUR) 30 MG 24 hr tablet TAKE 1 TABLET BY MOUTH DAILY 90 tablet 2  . levETIRAcetam (KEPPRA XR) 500 MG 24 hr tablet Take 1,000-1,500 mg by mouth 2 (two) times daily. Take 2 tablets by mouth in the am and 3 tablets by mouth in the pm    . metoprolol succinate (TOPROL-XL) 25 MG 24 hr tablet Take 1 tablet (25 mg total) by mouth daily. 90 tablet 1  . rosuvastatin (CRESTOR) 40 MG tablet Take 1 tablet (40 mg total) by  mouth daily. 90 tablet 1  . fluocinonide-emollient (LIDEX-E) 0.05 % cream Apply 1 application topically 2 (two) times daily. (Patient not taking: Reported on 10/01/2019) 60 g 0   No current facility-administered medications for this visit.     PHYSICAL EXAMINATION: ECOG PERFORMANCE STATUS: 1 - Symptomatic but completely ambulatory Vitals:   10/15/19 0912  BP: 107/71  Pulse: 86  Resp: 16  Temp: 97.8 F (36.6 C)  SpO2: 97%   Filed Weights   10/15/19 0912  Weight: 211 lb 11.2 oz (96 kg)    Physical Exam Constitutional:      General: He is not in acute distress. HENT:     Head: Normocephalic and atraumatic.  Eyes:     General: No scleral icterus. Cardiovascular:     Rate and Rhythm: Normal rate and  regular rhythm.     Heart sounds: Normal heart sounds.  Pulmonary:     Effort: Pulmonary effort is normal. No respiratory distress.     Breath sounds: No wheezing.  Abdominal:     General: Bowel sounds are normal. There is no distension.     Palpations: Abdomen is soft.  Musculoskeletal:        General: No deformity. Normal range of motion.     Cervical back: Normal range of motion and neck supple.  Skin:    General: Skin is warm and dry.     Findings: No erythema or rash.  Neurological:     Mental Status: He is alert. Mental status is at baseline.     Cranial Nerves: No cranial nerve deficit.     Coordination: Coordination normal.     Comments: Hearing loss  Psychiatric:        Mood and Affect: Mood normal.      LABORATORY DATA:  I have reviewed the data as listed Lab Results  Component Value Date   WBC 9.1 10/01/2019   HGB 14.6 10/01/2019   HCT 44.1 10/01/2019   MCV 94.2 10/01/2019   PLT 116 (L) 10/01/2019   Recent Labs    07/21/19 0952 10/01/19 1155  NA 137 139  K 4.5 4.3  CL 104 100  CO2 30 30  GLUCOSE 98 98  BUN 29* 33*  CREATININE 1.17 1.49*  CALCIUM 9.2 9.1  GFRNONAA  --  44*  GFRAA  --  51*  PROT 6.7 7.6  ALBUMIN 4.0 4.3  AST 30 48*  ALT  27 34  ALKPHOS 44 54  BILITOT 0.8 1.0   Iron/TIBC/Ferritin/ %Sat No results found for: IRON, TIBC, FERRITIN, IRONPCTSAT    RADIOGRAPHIC STUDIES: I have personally reviewed the radiological images as listed and agreed with the findings in the report.  No results found.   ASSESSMENT & PLAN:  1. Thrombocytopenia (HCC)   2. Elevated serum creatinine    Labs reviewed and discussed with patient and his wife.  I also called patient's daughter Marisue Humble and updated Smear showed unremarkable morphology.  Decreased platelet counts. Vitamin B12 low normal at 357 Normal folate level Negative flow cytometry CMP showed increased creatinine and decreased kidney function. Negative ANA, hepatitis panel, HIV Protein electrophoresis is negative Increased immature platelet fraction.  Indicating peripheral destruction.  Patient does not have constitutional symptoms.  Possible autoimmune process versus hypersplenism Will obtain ultrasound abdomen complete for further evaluation.  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  Patient's wife and daughter are concerned about if this is drug side effects from Keppra.  Keppra possible marrow suppression which leads to thrombocytopenia.  However immature platelet fraction usually will be depressed other than increased. Recommend patient to take vitamin B12 500 MCG daily.  I will repeat vitamin B12 and MMA at  the next visit  #Elevated creatinine, etiology unknown.  Patient does not use any NSAIDs.  Denies any urinary symptoms. Recommend patient to increase oral hydration and have creatinine rechecked at primary care provider's office.   Orders Placed This Encounter  Procedures  . US Abdomen Complete    Standing Status:   Future    Standing Expiration Date:   10/14/2020    Order Specific Question:   Reason for Exam (SYMPTOM  OR DIAGNOSIS REQUIRED)    Answer:   thrombocytopenia workup    Order  Specific Question:   Preferred imaging location?  Answer:   [facility]  . CBC with Differential/Platelet    Standing Status:   Future    Standing Expiration Date:   10/14/2020  . Comprehensive metabolic panel    Standing Status:   Future    Standing Expiration Date:   10/14/2020  . Vitamin B12    Standing Status:   Future    Standing Expiration Date:   10/14/2020  . Immature Platelet Fraction    Standing Status:   Future    Standing Expiration Date:   10/14/2020    All questions were answered. The patient knows to call the clinic with any problems questions or concerns.  Cc Glori LuisSonnenberg, Eric G, MD  Return of visit: 4 months Thank you for this kind referral and the opportunity to participate in the care of this patient. A copy of today's note is routed to referring provider    Rickard PatienceZhou Glenetta Kiger, MD, PhD 10/15/2019

## 2019-10-15 NOTE — Telephone Encounter (Signed)
Please let the patient know I received a message from the hematologist.  They wanted [place] to recheck his kidney function in a week or two.  Orders have been placed.  Please get him scheduled.  Thanks.

## 2019-10-21 ENCOUNTER — Ambulatory Visit
Admission: RE | Admit: 2019-10-21 | Discharge: 2019-10-21 | Disposition: A | Discharge status: Home / Self Care | Payer: Medicare Other | Source: Ambulatory Visit | Attending: Oncology | Admitting: Oncology

## 2019-10-21 ENCOUNTER — Other Ambulatory Visit: Payer: Self-pay

## 2019-10-21 DIAGNOSIS — Q600 Renal agenesis, unilateral: Secondary | ICD-10-CM | POA: Diagnosis not present

## 2019-10-21 DIAGNOSIS — N281 Cyst of kidney, acquired: Secondary | ICD-10-CM | POA: Diagnosis not present

## 2019-10-21 DIAGNOSIS — D696 Thrombocytopenia, unspecified: Secondary | ICD-10-CM | POA: Diagnosis not present

## 2019-10-21 DIAGNOSIS — K8020 Calculus of gallbladder without cholecystitis without obstruction: Secondary | ICD-10-CM | POA: Diagnosis not present

## 2019-10-21 DIAGNOSIS — K828 Other specified diseases of gallbladder: Secondary | ICD-10-CM | POA: Diagnosis not present

## 2019-10-29 ENCOUNTER — Other Ambulatory Visit: Payer: Self-pay

## 2019-10-29 ENCOUNTER — Other Ambulatory Visit: Payer: Self-pay | Admitting: Family Medicine

## 2019-10-29 ENCOUNTER — Other Ambulatory Visit (INDEPENDENT_AMBULATORY_CARE_PROVIDER_SITE_OTHER): Payer: Medicare Other

## 2019-10-29 DIAGNOSIS — N179 Acute kidney failure, unspecified: Secondary | ICD-10-CM

## 2019-10-29 LAB — BASIC METABOLIC PANEL
BUN: 29 mg/dL — ABNORMAL HIGH (ref 6–23)
CO2: 31 mEq/L (ref 19–32)
Calcium: 9.1 mg/dL (ref 8.4–10.5)
Chloride: 102 mEq/L (ref 96–112)
Creatinine, Ser: 1.33 mg/dL (ref 0.40–1.50)
GFR: 51.76 mL/min — ABNORMAL LOW (ref >60.00)
Glucose, Bld: 91 mg/dL (ref 70–99)
Potassium: 4.4 mEq/L (ref 3.5–5.1)
Sodium: 138 mEq/L (ref 135–145)

## 2019-11-02 ENCOUNTER — Telehealth: Payer: Self-pay

## 2019-11-02 NOTE — Telephone Encounter (Signed)
-----   Message from [name], MD sent at 10/29/2019  3:49 PM EDT ----- Please let the patient know that his kidney function has improved slightly this does still remain decreased from his baseline.  Please see how much water he has been drinking as it appears he could be slightly dehydrated.  I would suggest we recheck this in another couple of weeks with adequate hydration.  Order placed.  Thanks.

## 2019-11-05 ENCOUNTER — Telehealth: Payer: Self-pay | Admitting: *Deleted

## 2019-11-05 NOTE — Telephone Encounter (Signed)
Spoke to pt's wife, Towanda Malkin, to notify her that US showed gallstones and there were no other concerns unless he is having any symptoms. She states that he has had them for years and he is not having pain or any other symptom.

## 2019-11-05 NOTE — Telephone Encounter (Signed)
Blake Osborne called asking for results of US   IMPRESSION: Bilateral simple renal cysts  Cholelithiasis without complicating factors.   Electronically Signed   By: Alcide Clever M.D.   On: 10/22/2019 03:12

## 2019-11-30 DIAGNOSIS — Z23 Encounter for immunization: Secondary | ICD-10-CM | POA: Diagnosis not present

## 2019-12-27 ENCOUNTER — Encounter: Payer: Self-pay | Admitting: Family Medicine

## 2019-12-27 ENCOUNTER — Other Ambulatory Visit: Payer: Self-pay

## 2019-12-27 ENCOUNTER — Telehealth (INDEPENDENT_AMBULATORY_CARE_PROVIDER_SITE_OTHER): Payer: Medicare Other | Admitting: Family Medicine

## 2019-12-27 DIAGNOSIS — M109 Gout, unspecified: Secondary | ICD-10-CM | POA: Diagnosis not present

## 2019-12-27 DIAGNOSIS — N179 Acute kidney failure, unspecified: Secondary | ICD-10-CM | POA: Insufficient documentation

## 2019-12-27 DIAGNOSIS — G40909 Epilepsy, unspecified, not intractable, without status epilepticus: Secondary | ICD-10-CM | POA: Diagnosis not present

## 2019-12-27 DIAGNOSIS — E785 Hyperlipidemia, unspecified: Secondary | ICD-10-CM

## 2019-12-27 DIAGNOSIS — D696 Thrombocytopenia, unspecified: Secondary | ICD-10-CM | POA: Diagnosis not present

## 2019-12-27 DIAGNOSIS — I10 Essential (primary) hypertension: Secondary | ICD-10-CM

## 2019-12-27 MED ORDER — APIXABAN 5 MG PO TABS
5.0000 mg | ORAL_TABLET | Freq: Two times a day (BID) | ORAL | 1 refills | Status: DC
Start: 2019-12-27 — End: 2020-02-21

## 2019-12-27 MED ORDER — GABAPENTIN 100 MG PO CAPS: 200.0000 mg | ORAL_CAPSULE | Freq: Two times a day (BID) | ORAL | 2 refills | Status: DC

## 2019-12-27 MED ORDER — ROSUVASTATIN CALCIUM 40 MG PO TABS
40.0000 mg | ORAL_TABLET | Freq: Every day | ORAL | 1 refills | Status: DC
Start: 2019-12-27 — End: 2020-02-21

## 2019-12-27 MED ORDER — HYDROCHLOROTHIAZIDE 12.5 MG PO CAPS
12.5000 mg | ORAL_CAPSULE | Freq: Every day | ORAL | 2 refills | Status: DC
Start: 2019-12-27 — End: 2020-02-21

## 2019-12-27 MED ORDER — FLUOXETINE HCL 40 MG PO CAPS
40.0000 mg | ORAL_CAPSULE | Freq: Every day | ORAL | 1 refills | Status: DC
Start: 2019-12-27 — End: 2020-03-20

## 2019-12-27 NOTE — Assessment & Plan Note (Signed)
Suspect related to dehydration.  Encouraged increasing water intake to at least 4 glasses a day.  Recheck in 2 weeks.

## 2019-12-27 NOTE — Progress Notes (Signed)
Virtual Visit via telephone Note  This visit type was conducted due to national recommendations for restrictions regarding the COVID-19 pandemic (e.g. social distancing).  This format is felt to be most appropriate for this patient at this time.  All issues noted in this document were discussed and addressed.  No physical exam was performed (except for noted visual exam findings with Video Visits).   I connected with Shana Chute today at  9:30 AM EDT by telephone and verified that I am speaking with the correct person using two identifiers. Location patient: home Location provider: work Persons participating in the virtual visit: patient, provider, Maikol Grassia (wife)  I discussed the limitations, risks, security and privacy concerns of performing an evaluation and management service by telephone and the availability of in person appointments. I also discussed with the patient that there may be a patient responsible charge related to this service. The patient expressed understanding and agreed to proceed.  Interactive audio and video telecommunications were attempted between this provider and patient, however failed, due to patient having technical difficulties OR patient did not have access to video capability.  We continued and completed visit with audio only.   Reason for visit: f/u.  HPI: HYPERTENSION  Disease Monitoring  Home BP Monitoring 110 systolic Chest pain- no    Dyspnea- no Medications  Compliance-  Taking imdur, HCTZ, metoprolol  HYPERLIPIDEMIA Symptoms Chest pain on exertion:  no   Medications: Compliance- taking crestor Right upper quadrant pain- no  Muscle aches- no  Thrombocytopenia: Patient has seen hematology.  Work-up thus far has been negative.  Ultrasound revealed normal spleen.  Did have several simple cysts on his kidneys.  They plan for repeat labs and follow-up in November.  AKI: Noted on recent lab work.  No NSAIDs.  His wife notes he is only  drinking about 2 glasses of water daily.  Gout: Patient notes a couple of times he has had podagra that responded to 2 to 3 days of allopurinol.  They note it started after eating shrimp.  He had not had any gout issues in quite some time prior to this.  Seizure history: Continues to follow with neurology.  Has an appointment with them in October.  Has not had any recent seizures.  Has been stable on Keppra.     ROS: See pertinent positives and negatives per HPI.  Past Medical History:  Diagnosis Date  . Atrial fibrillation (HCC)   . Coronary arteriosclerosis due to lipid rich plaque    s/p 3vessel CABG at [facility] 2011  . Hyperlipidemia   . Hypertension   . Left BB block NEC   . Pacemaker     Past Surgical History:  Procedure Laterality Date  . CARDIAC SURGERY    . CORONARY ARTERY BYPASS GRAFT    . PACEMAKER GENERATOR CHANGE      Family History  Problem Relation Age of Onset  . Heart disease Father   . Heart attack Father   . Aneurysm Brother        heart    SOCIAL HX: Former smoker   Current Outpatient Medications:  .  apixaban (ELIQUIS) 5 MG TABS tablet, Take 1 tablet (5 mg total) by mouth 2 (two) times daily., Disp: 180 tablet, Rfl: 1 .  cetirizine (ZYRTEC) 10 MG tablet, Take 10 mg by mouth daily as needed. , Disp: , Rfl:  .  fluocinonide-emollient (LIDEX-E) 0.05 % cream, Apply 1 application topically 2 (two) times daily., Disp: 60  g, Rfl: 0 .  FLUoxetine (PROZAC) 40 MG capsule, Take 1 capsule (40 mg total) by mouth daily., Disp: 90 capsule, Rfl: 1 .  gabapentin (NEURONTIN) 100 MG capsule, Take 2 capsules (200 mg total) by mouth 2 (two) times daily., Disp: 360 capsule, Rfl: 2 .  hydrochlorothiazide (MICROZIDE) 12.5 MG capsule, Take 1 capsule (12.5 mg total) by mouth daily., Disp: 90 capsule, Rfl: 2 .  isosorbide mononitrate (IMDUR) 30 MG 24 hr tablet, TAKE 1 TABLET BY MOUTH DAILY, Disp: 90 tablet, Rfl: 2 .  levETIRAcetam (KEPPRA XR) 500 MG 24 hr tablet, Take  1,000-1,500 mg by mouth 2 (two) times daily. Take 2 tablets by mouth in the am and 3 tablets by mouth in the pm, Disp: , Rfl:  .  metoprolol succinate (TOPROL-XL) 25 MG 24 hr tablet, Take 1 tablet (25 mg total) by mouth daily., Disp: 90 tablet, Rfl: 1 .  rosuvastatin (CRESTOR) 40 MG tablet, Take 1 tablet (40 mg total) by mouth daily., Disp: 90 tablet, Rfl: 1  EXAM: This was a telephone visit and thus no physical exam was completed.  ASSESSMENT AND PLAN:  Discussed the following assessment and plan:  Hypertension Adequate control.  Continue current regimen.  Seizure disorder (HCC) Stable.  He will continue to see neurology.  I advised that he would need to get his refill of Keppra from the neurology office and they can contact the office or the pharmacy for refill.  Gout He has had a couple of recent episodes.  We will check a uric acid.  Thrombocytopenia (HCC) Negative work-up thus far.  Encouraged him to see hematology again in November and discussed that they may release him back to me to follow his labs if his follow-up visit goes well.  Hyperlipidemia Continue Crestor.  Lipids adequately controlled in January.  AKI (acute kidney injury) (HCC) Suspect related to dehydration.  Encouraged increasing water intake to at least 4 glasses a day.  Recheck in 2 weeks.   Orders Placed This Encounter  Procedures  . Basic Metabolic Panel (BMET)    Standing Status:   Future    Standing Expiration Date:   12/26/2020  . Uric acid    Standing Status:   Future    Standing Expiration Date:   12/26/2020    Meds ordered this encounter  Medications  . gabapentin (NEURONTIN) 100 MG capsule    Sig: Take 2 capsules (200 mg total) by mouth 2 (two) times daily.    Dispense:  360 capsule    Refill:  2  . apixaban (ELIQUIS) 5 MG TABS tablet    Sig: Take 1 tablet (5 mg total) by mouth 2 (two) times daily.    Dispense:  180 tablet    Refill:  1  . rosuvastatin (CRESTOR) 40 MG tablet    Sig:  Take 1 tablet (40 mg total) by mouth daily.    Dispense:  90 tablet    Refill:  1  . FLUoxetine (PROZAC) 40 MG capsule    Sig: Take 1 capsule (40 mg total) by mouth daily.    Dispense:  90 capsule    Refill:  1  . hydrochlorothiazide (MICROZIDE) 12.5 MG capsule    Sig: Take 1 capsule (12.5 mg total) by mouth daily.    Dispense:  90 capsule    Refill:  2     I discussed the assessment and treatment plan with the patient. The patient was provided an opportunity to ask questions and all were answered. The  patient agreed with the plan and demonstrated an understanding of the instructions.   The patient was advised to call back or seek an in-person evaluation if the symptoms worsen or if the condition fails to improve as anticipated.  I provided 12 minutes of non-face-to-face time during this encounter.   [name], MD

## 2019-12-27 NOTE — Assessment & Plan Note (Signed)
He has had a couple of recent episodes.  We will check a uric acid.

## 2019-12-27 NOTE — Assessment & Plan Note (Signed)
Stable.  He will continue to see neurology.  I advised that he would need to get his refill of Keppra from the neurology office and they can contact the office or the pharmacy for refill.

## 2019-12-27 NOTE — Assessment & Plan Note (Signed)
Adequate control. Continue current regimen.  

## 2019-12-27 NOTE — Assessment & Plan Note (Signed)
Continue Crestor.  Lipids adequately controlled in January.

## 2019-12-27 NOTE — Assessment & Plan Note (Signed)
Negative work-up thus far.  Encouraged him to see hematology again in November and discussed that they may release him back to me to follow his labs if his follow-up visit goes well.

## 2020-01-11 ENCOUNTER — Other Ambulatory Visit (INDEPENDENT_AMBULATORY_CARE_PROVIDER_SITE_OTHER): Payer: Medicare Other

## 2020-01-11 ENCOUNTER — Other Ambulatory Visit: Payer: Medicare Other

## 2020-01-11 ENCOUNTER — Other Ambulatory Visit: Payer: Self-pay

## 2020-01-11 DIAGNOSIS — M109 Gout, unspecified: Secondary | ICD-10-CM

## 2020-01-11 DIAGNOSIS — N179 Acute kidney failure, unspecified: Secondary | ICD-10-CM | POA: Diagnosis not present

## 2020-01-11 DIAGNOSIS — I10 Essential (primary) hypertension: Secondary | ICD-10-CM | POA: Diagnosis not present

## 2020-01-11 DIAGNOSIS — Z23 Encounter for immunization: Secondary | ICD-10-CM | POA: Diagnosis not present

## 2020-01-11 LAB — BASIC METABOLIC PANEL
BUN: 27 mg/dL — ABNORMAL HIGH (ref 6–23)
CO2: 30 mEq/L (ref 19–32)
Calcium: 9.3 mg/dL (ref 8.4–10.5)
Chloride: 104 mEq/L (ref 96–112)
Creatinine, Ser: 1.32 mg/dL (ref 0.40–1.50)
GFR: 52.19 mL/min — ABNORMAL LOW (ref >60.00)
Glucose, Bld: 97 mg/dL (ref 70–99)
Potassium: 4.5 mEq/L (ref 3.5–5.1)
Sodium: 138 mEq/L (ref 135–145)

## 2020-01-11 LAB — URIC ACID: Uric Acid, Serum: 7.5 mg/dL (ref 4.0–7.8)

## 2020-01-17 DIAGNOSIS — Z45018 Encounter for adjustment and management of other part of cardiac pacemaker: Secondary | ICD-10-CM | POA: Diagnosis not present

## 2020-01-17 DIAGNOSIS — I4430 Unspecified atrioventricular block: Secondary | ICD-10-CM | POA: Diagnosis not present

## 2020-01-17 DIAGNOSIS — Z950 Presence of cardiac pacemaker: Secondary | ICD-10-CM | POA: Diagnosis not present

## 2020-01-20 ENCOUNTER — Other Ambulatory Visit: Payer: Self-pay | Admitting: Family Medicine

## 2020-01-20 DIAGNOSIS — M109 Gout, unspecified: Secondary | ICD-10-CM

## 2020-01-20 MED ORDER — ALLOPURINOL 100 MG PO TABS: 50.0000 mg | ORAL_TABLET | Freq: Every day | ORAL | 1 refills | Status: DC

## 2020-01-22 ENCOUNTER — Other Ambulatory Visit: Payer: Self-pay | Admitting: Family Medicine

## 2020-01-26 ENCOUNTER — Telehealth: Payer: Self-pay

## 2020-01-27 DIAGNOSIS — I4430 Unspecified atrioventricular block: Secondary | ICD-10-CM | POA: Diagnosis not present

## 2020-01-27 DIAGNOSIS — I2510 Atherosclerotic heart disease of native coronary artery without angina pectoris: Secondary | ICD-10-CM | POA: Diagnosis not present

## 2020-01-27 DIAGNOSIS — I10 Essential (primary) hypertension: Secondary | ICD-10-CM | POA: Diagnosis not present

## 2020-01-27 DIAGNOSIS — Z950 Presence of cardiac pacemaker: Secondary | ICD-10-CM | POA: Diagnosis not present

## 2020-01-27 DIAGNOSIS — I4811 Longstanding persistent atrial fibrillation: Secondary | ICD-10-CM | POA: Diagnosis not present

## 2020-02-07 DIAGNOSIS — E785 Hyperlipidemia, unspecified: Secondary | ICD-10-CM | POA: Diagnosis not present

## 2020-02-07 DIAGNOSIS — G40109 Localization-related (focal) (partial) symptomatic epilepsy and epileptic syndromes with simple partial seizures, not intractable, without status epilepticus: Secondary | ICD-10-CM | POA: Diagnosis not present

## 2020-02-07 DIAGNOSIS — Z79899 Other long term (current) drug therapy: Secondary | ICD-10-CM | POA: Diagnosis not present

## 2020-02-07 DIAGNOSIS — I613 Nontraumatic intracerebral hemorrhage in brain stem: Secondary | ICD-10-CM | POA: Diagnosis not present

## 2020-02-07 DIAGNOSIS — Z951 Presence of aortocoronary bypass graft: Secondary | ICD-10-CM | POA: Diagnosis not present

## 2020-02-07 DIAGNOSIS — W1789XA Other fall from one level to another, initial encounter: Secondary | ICD-10-CM | POA: Diagnosis not present

## 2020-02-07 DIAGNOSIS — Z950 Presence of cardiac pacemaker: Secondary | ICD-10-CM | POA: Diagnosis not present

## 2020-02-07 DIAGNOSIS — I10 Essential (primary) hypertension: Secondary | ICD-10-CM | POA: Diagnosis not present

## 2020-02-07 DIAGNOSIS — M109 Gout, unspecified: Secondary | ICD-10-CM | POA: Diagnosis not present

## 2020-02-07 DIAGNOSIS — I480 Paroxysmal atrial fibrillation: Secondary | ICD-10-CM | POA: Diagnosis not present

## 2020-02-07 DIAGNOSIS — S2231XA Fracture of one rib, right side, initial encounter for closed fracture: Secondary | ICD-10-CM | POA: Diagnosis not present

## 2020-02-07 DIAGNOSIS — I2510 Atherosclerotic heart disease of native coronary artery without angina pectoris: Secondary | ICD-10-CM | POA: Diagnosis not present

## 2020-02-07 DIAGNOSIS — Z7901 Long term (current) use of anticoagulants: Secondary | ICD-10-CM | POA: Diagnosis not present

## 2020-02-10 DIAGNOSIS — Z45010 Encounter for checking and testing of cardiac pacemaker pulse generator [battery]: Secondary | ICD-10-CM | POA: Diagnosis not present

## 2020-02-10 DIAGNOSIS — I442 Atrioventricular block, complete: Secondary | ICD-10-CM | POA: Diagnosis not present

## 2020-02-10 DIAGNOSIS — Z950 Presence of cardiac pacemaker: Secondary | ICD-10-CM | POA: Diagnosis not present

## 2020-02-14 ENCOUNTER — Other Ambulatory Visit: Payer: Self-pay | Admitting: Family Medicine

## 2020-02-16 ENCOUNTER — Inpatient Hospital Stay: Payer: Medicare Other | Attending: Oncology

## 2020-02-16 DIAGNOSIS — Z7901 Long term (current) use of anticoagulants: Secondary | ICD-10-CM | POA: Insufficient documentation

## 2020-02-16 DIAGNOSIS — I2510 Atherosclerotic heart disease of native coronary artery without angina pectoris: Secondary | ICD-10-CM | POA: Insufficient documentation

## 2020-02-16 DIAGNOSIS — D696 Thrombocytopenia, unspecified: Secondary | ICD-10-CM | POA: Insufficient documentation

## 2020-02-16 DIAGNOSIS — I4891 Unspecified atrial fibrillation: Secondary | ICD-10-CM | POA: Insufficient documentation

## 2020-02-18 ENCOUNTER — Encounter: Payer: Self-pay | Admitting: Oncology

## 2020-02-18 ENCOUNTER — Other Ambulatory Visit: Payer: Self-pay

## 2020-02-18 ENCOUNTER — Inpatient Hospital Stay (HOSPITAL_BASED_OUTPATIENT_CLINIC_OR_DEPARTMENT_OTHER): Payer: Medicare Other | Admitting: Oncology

## 2020-02-18 VITALS — BP 98/67 | HR 74 | Temp 96.7°F | Resp 18 | Wt 214.1 lb

## 2020-02-18 DIAGNOSIS — I2510 Atherosclerotic heart disease of native coronary artery without angina pectoris: Secondary | ICD-10-CM | POA: Diagnosis not present

## 2020-02-18 DIAGNOSIS — D696 Thrombocytopenia, unspecified: Secondary | ICD-10-CM | POA: Diagnosis not present

## 2020-02-18 DIAGNOSIS — I4891 Unspecified atrial fibrillation: Secondary | ICD-10-CM | POA: Diagnosis not present

## 2020-02-18 DIAGNOSIS — Z7901 Long term (current) use of anticoagulants: Secondary | ICD-10-CM | POA: Diagnosis not present

## 2020-02-18 NOTE — Progress Notes (Signed)
Patient denies new problems/concerns today.   °

## 2020-02-18 NOTE — Progress Notes (Signed)
Hematology/Oncology follow up  note [facility] [phone] [phone]) [phone]   Patient Care Team: Glori LuisSonnenberg, Eric G, MD as PCP - General (Family Medicine)  REFERRING PROVIDER: Glori LuisSonnenberg, Eric G, MD  CHIEF COMPLAINTS/REASON FOR VISIT:  Follow up  thrombocytopenia  HISTORY OF PRESENTING ILLNESS:  Blake Osborne is a 79 y.o. male who was seen in consultation at the request of Birdie SonsSonnenberg, Yehuda MaoEric G, MD for evaluation of thrombocytopenia   Reviewed patient's labs done previously.  4 7 labs showed decreased platelet counts at 114,000. wbc 7.5 hemoglobin 13.9. Reviewed patient's previous labs. 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 No aggravating or elevated factors.  Associated symptoms or signs:  Denies weight loss, fever, chills, fatigue, night sweats.  Denies hematochezia, hematuria, hematemesis, epistaxis, black tarry stool.  easy bruising.   Context:  History of hepatitis or HIV infection, denies History of gastric bypass: denies History of autoimmune disease:  Denies  History of coronary artery disease, status post CABG.  Atrial fibrillation on Eliquis.  + Forgetful per wife. Patient also takes Keppra  INTERVAL HISTORY Blake CordJoseph Terrence Osborne is a 79 y.o. male who has above history reviewed by me today presents for follow up visit for thrombocytopenia Problems and complaints are listed below: Patient did not do blood work prior to this visit. He has no new complaints.  Accompanied by wife. No bleeding events.  He was advised to take vitamin B12 supplementation which he has not.  Review of Systems  Constitutional: Negative for appetite change, chills, fatigue, fever and unexpected weight change.  HENT:   Positive for hearing loss. Negative for voice change.   Eyes: Negative for eye problems and icterus.  Respiratory: Negative for chest tightness, cough and shortness of breath.   Cardiovascular:  Negative for chest pain and leg swelling.  Gastrointestinal: Negative for abdominal distention and abdominal pain.  Endocrine: Negative for hot flashes.  Genitourinary: Negative for difficulty urinating, dysuria and frequency.   Musculoskeletal: Negative for arthralgias.  Skin: Negative for itching and rash.  Neurological: Negative for light-headedness and numbness.  Hematological: Negative for adenopathy. Does not bruise/bleed easily.  Psychiatric/Behavioral: Negative for confusion.       Forgetful    MEDICAL HISTORY:  Past Medical History:  Diagnosis Date  . Atrial fibrillation (HCC)   . Coronary arteriosclerosis due to lipid rich plaque    [facility]  . Hyperlipidemia   . Hypertension   . Left BB block NEC   . Pacemaker   . Thrombocytopenia (HCC)     SURGICAL HISTORY: Past Surgical History:  Procedure Laterality Date  . CARDIAC SURGERY    . CORONARY ARTERY BYPASS GRAFT    . PACEMAKER GENERATOR CHANGE      SOCIAL HISTORY: Social History   Socioeconomic History  . Marital status: Married    Spouse name: Not on file  . Number of children: Not on file  . Years of education: Not on file  . Highest education level: Not on file  Occupational History  . Not on file  Tobacco Use  . Smoking status: Former Smoker    Years: 8.00    Quit date: 10/01/1959    Years since quitting: 60.4  . Smokeless tobacco: Never Used  Vaping Use  . Vaping Use: Never used  Substance and Sexual Activity  . Alcohol use: Not Currently    Alcohol/week: 1.0 standard drink    Types: 1 Glasses of wine per week  Comment: occasional glass of wine  . Drug use: No  . Sexual activity: Not on file  Other Topics Concern  . Not on file  Social History Narrative  . Not on file   Social Determinants of Health   Financial Resource Strain:   . Difficulty of Paying Living Expenses: Not on file  Food Insecurity:   . Worried About Programme researcher, broadcasting/film/video in the Last Year:  Not on file  . Ran Out of Food in the Last Year: Not on file  Transportation Needs:   . Lack of Transportation (Medical): Not on file  . Lack of Transportation (Non-Medical): Not on file  Physical Activity:   . Days of Exercise per Week: Not on file  . Minutes of Exercise per Session: Not on file  Stress:   . Feeling of Stress : Not on file  Social Connections: Unknown  . Frequency of Communication with Friends and Family: Three times a week  . Frequency of Social Gatherings with Friends and Family: Three times a week  . Attends Religious Services: Not on file  . Active Member of Clubs or Organizations: Not on file  . Attends Banker Meetings: Not on file  . Marital Status: Married  Catering manager Violence:   . Fear of Current or Ex-Partner: Not on file  . Emotionally Abused: Not on file  . Physically Abused: Not on file  . Sexually Abused: Not on file    FAMILY HISTORY: Family History  Problem Relation Age of Onset  . Heart disease Father   . Heart attack Father   . Aneurysm Brother        heart    ALLERGIES:  is allergic to augmentin [amoxicillin-pot clavulanate], dust mite extract, other, and pollen extract.  MEDICATIONS:  Current Outpatient Medications  Medication Sig Dispense Refill  . allopurinol (ZYLOPRIM) 100 MG tablet Take 0.5 tablets (50 mg total) by mouth daily. (Patient taking differently: Take 50 mg by mouth daily as needed. ) 45 tablet 1  . apixaban (ELIQUIS) 5 MG TABS tablet Take 1 tablet (5 mg total) by mouth 2 (two) times daily. 180 tablet 1  . cetirizine (ZYRTEC) 10 MG tablet Take 10 mg by mouth daily as needed.     . fluocinonide-emollient (LIDEX-E) 0.05 % cream Apply 1 application topically 2 (two) times daily. 60 g 0  . FLUoxetine (PROZAC) 40 MG capsule Take 1 capsule (40 mg total) by mouth daily. 90 capsule 1  . gabapentin (NEURONTIN) 100 MG capsule Take 2 capsules (200 mg total) by mouth 2 (two) times daily. 360 capsule 2  .  hydrochlorothiazide (MICROZIDE) 12.5 MG capsule Take 1 capsule (12.5 mg total) by mouth daily. 90 capsule 2  . isosorbide mononitrate (IMDUR) 30 MG 24 hr tablet TAKE 1 TABLET BY MOUTH EVERY DAY 90 tablet 2  . levETIRAcetam (KEPPRA XR) 500 MG 24 hr tablet Take 1,000-1,500 mg by mouth 2 (two) times daily. Take 2 tablets by mouth in the am and 3 tablets by mouth in the pm    . metoprolol succinate (TOPROL-XL) 25 MG 24 hr tablet TAKE 1 TABLET(25 MG) BY MOUTH DAILY 90 tablet 1  . rosuvastatin (CRESTOR) 40 MG tablet Take 1 tablet (40 mg total) by mouth daily. 90 tablet 1  . FLUoxetine (PROZAC) 20 MG capsule TAKE ONE CAPSULE BY MOUTH EVERY DAY (Patient not taking: Reported on 02/18/2020) 90 capsule 0   No current facility-administered medications for this visit.     PHYSICAL EXAMINATION:  ECOG PERFORMANCE STATUS: 1 - Symptomatic but completely ambulatory Vitals:   02/18/20 1006  BP: 98/67  Pulse: 74  Resp: 18  Temp: (!) 96.7 F (35.9 C)   Filed Weights   02/18/20 1006  Weight: 214 lb 1.6 oz (97.1 kg)    Physical Exam Constitutional:      General: He is not in acute distress. HENT:     Head: Normocephalic and atraumatic.  Eyes:     General: No scleral icterus. Cardiovascular:     Rate and Rhythm: Normal rate and regular rhythm.     Heart sounds: Normal heart sounds.  Pulmonary:     Effort: Pulmonary effort is normal. No respiratory distress.     Breath sounds: No wheezing.  Abdominal:     General: Bowel sounds are normal. There is no distension.     Palpations: Abdomen is soft.  Musculoskeletal:        General: No deformity. Normal range of motion.     Cervical back: Normal range of motion and neck supple.  Skin:    General: Skin is warm and dry.     Findings: No erythema or rash.  Neurological:     Mental Status: He is alert. Mental status is at baseline.     Cranial Nerves: No cranial nerve deficit.     Coordination: Coordination normal.     Comments: Hearing loss    Psychiatric:        Mood and Affect: Mood normal.      LABORATORY DATA:  I have reviewed the data as listed Lab Results  Component Value Date   WBC 9.1 10/01/2019   HGB 14.6 10/01/2019   HCT 44.1 10/01/2019   MCV 94.2 10/01/2019   PLT 116 (L) 10/01/2019   Recent Labs    07/21/19 0952 07/21/19 0952 10/01/19 1155 10/29/19 0918 01/11/20 0941  NA 137   < > 139 138 138  K 4.5   < > 4.3 4.4 4.5  CL 104   < > 100 102 104  CO2 30   < > 30 31 30   GLUCOSE 98   < > 98 91 97  BUN 29*   < > 33* 29* 27*  CREATININE 1.17   < > 1.49* 1.33 1.32  CALCIUM 9.2   < > 9.1 9.1 9.3  GFRNONAA  --   --  44*  --   --   GFRAA  --   --  51*  --   --   PROT 6.7  --  7.6  --   --   ALBUMIN 4.0  --  4.3  --   --   AST 30  --  48*  --   --   ALT 27  --  34  --   --   ALKPHOS 44  --  54  --   --   BILITOT 0.8  --  1.0  --   --    < > = values in this interval not displayed.   Iron/TIBC/Ferritin/ %Sat No results found for: IRON, TIBC, FERRITIN, IRONPCTSAT    RADIOGRAPHIC STUDIES: I have personally reviewed the radiological images as listed and agreed with the findings in the report.  No results found.   ASSESSMENT & PLAN:  1. Thrombocytopenia (HCC)    #Thrombocytopenia. Previous work-up showed Smear showed unremarkable morphology.  Decreased platelet counts. Vitamin B12 low normal at 357 Normal folate level Negative flow cytometry CMP showed increased creatinine and decreased kidney function.  Negative ANA, hepatitis panel, HIV Protein electrophoresis is negative Increased immature platelet fraction.  Indicating peripheral destruction. Ultrasound showed bilateral simple renal cysts.  Cholelithiasis.  No splenomegaly-results discussed with patient and wife  Patient did not have blood work done prior to current visit. He had CBC done at [facility] clinic on 01/27/2020 which I reviewed.  Hemoglobin is 14.2, platelet count stable at 126. Continue observation.  I recommend patient to  empirically take vitamin B12 500 MCG daily.  B12 level is at the low normal end. He did not do blood work prior to the current visit.  I will repeat vitamin B12 and MMA at  the next visit   All questions were answered. The patient knows to call the clinic with any problems questions or concerns.  Return of visit: 6 months Thank you for this kind referral and the opportunity to participate in the care of this patient. A copy of today's note is routed to referring provider    Rickard Patience, MD, PhD 02/18/2020

## 2020-02-19 ENCOUNTER — Other Ambulatory Visit: Payer: Self-pay | Admitting: Family Medicine

## 2020-02-20 ENCOUNTER — Other Ambulatory Visit: Payer: Self-pay | Admitting: Family Medicine

## 2020-03-01 DIAGNOSIS — Z45018 Encounter for adjustment and management of other part of cardiac pacemaker: Secondary | ICD-10-CM | POA: Diagnosis not present

## 2020-03-01 DIAGNOSIS — Z950 Presence of cardiac pacemaker: Secondary | ICD-10-CM | POA: Diagnosis not present

## 2020-03-01 DIAGNOSIS — I442 Atrioventricular block, complete: Secondary | ICD-10-CM | POA: Diagnosis not present

## 2020-03-19 ENCOUNTER — Other Ambulatory Visit: Payer: Self-pay | Admitting: Family Medicine

## 2020-05-01 NOTE — Telephone Encounter (Signed)
Error. ng 

## 2020-05-14 ENCOUNTER — Other Ambulatory Visit: Payer: Self-pay | Admitting: Family Medicine

## 2020-06-02 DIAGNOSIS — I442 Atrioventricular block, complete: Secondary | ICD-10-CM | POA: Diagnosis not present

## 2020-06-02 DIAGNOSIS — I4430 Unspecified atrioventricular block: Secondary | ICD-10-CM | POA: Diagnosis not present

## 2020-06-02 DIAGNOSIS — I2510 Atherosclerotic heart disease of native coronary artery without angina pectoris: Secondary | ICD-10-CM | POA: Diagnosis not present

## 2020-06-02 DIAGNOSIS — Z950 Presence of cardiac pacemaker: Secondary | ICD-10-CM | POA: Diagnosis not present

## 2020-06-02 DIAGNOSIS — I4811 Longstanding persistent atrial fibrillation: Secondary | ICD-10-CM | POA: Diagnosis not present

## 2020-06-02 DIAGNOSIS — Z45018 Encounter for adjustment and management of other part of cardiac pacemaker: Secondary | ICD-10-CM | POA: Diagnosis not present

## 2020-06-02 DIAGNOSIS — Z951 Presence of aortocoronary bypass graft: Secondary | ICD-10-CM | POA: Diagnosis not present

## 2020-06-27 ENCOUNTER — Encounter: Payer: Self-pay | Admitting: Family Medicine

## 2020-06-27 ENCOUNTER — Ambulatory Visit (INDEPENDENT_AMBULATORY_CARE_PROVIDER_SITE_OTHER): Payer: Medicare Other | Admitting: Family Medicine

## 2020-06-27 ENCOUNTER — Other Ambulatory Visit: Payer: Self-pay

## 2020-06-27 DIAGNOSIS — I480 Paroxysmal atrial fibrillation: Secondary | ICD-10-CM

## 2020-06-27 DIAGNOSIS — M109 Gout, unspecified: Secondary | ICD-10-CM

## 2020-06-27 DIAGNOSIS — I10 Essential (primary) hypertension: Secondary | ICD-10-CM | POA: Diagnosis not present

## 2020-06-27 DIAGNOSIS — Z8673 Personal history of transient ischemic attack (TIA), and cerebral infarction without residual deficits: Secondary | ICD-10-CM | POA: Diagnosis not present

## 2020-06-27 DIAGNOSIS — N179 Acute kidney failure, unspecified: Secondary | ICD-10-CM

## 2020-06-27 LAB — CBC WITH DIFFERENTIAL/PLATELET
Basophils Absolute: 0.1 10*3/uL (ref 0.0–0.1)
Basophils Relative: 0.9 % (ref 0.0–3.0)
Eosinophils Absolute: 0.8 10*3/uL — ABNORMAL HIGH (ref 0.0–0.7)
Eosinophils Relative: 11.1 % — ABNORMAL HIGH (ref 0.0–5.0)
HCT: 42.1 % (ref 39.0–52.0)
Hemoglobin: 14.2 g/dL (ref 13.0–17.0)
Lymphocytes Relative: 18.9 % (ref 12.0–46.0)
Lymphs Abs: 1.4 10*3/uL (ref 0.7–4.0)
MCHC: 33.8 g/dL (ref 30.0–36.0)
MCV: 93.8 fl (ref 78.0–100.0)
Monocytes Absolute: 0.7 10*3/uL (ref 0.1–1.0)
Monocytes Relative: 9.3 % (ref 3.0–12.0)
Neutro Abs: 4.5 10*3/uL (ref 1.4–7.7)
Neutrophils Relative %: 59.8 % (ref 43.0–77.0)
Platelets: 119 10*3/uL — ABNORMAL LOW (ref 150.0–400.0)
RBC: 4.48 Mil/uL (ref 4.22–5.81)
RDW: 13.1 % (ref 11.5–15.5)
WBC: 7.5 10*3/uL (ref 4.0–10.5)

## 2020-06-27 LAB — COMPREHENSIVE METABOLIC PANEL
ALT: 29 U/L (ref 0–53)
AST: 34 U/L (ref 0–37)
Albumin: 4.1 g/dL (ref 3.5–5.2)
Alkaline Phosphatase: 64 U/L (ref 39–117)
BUN: 25 mg/dL — ABNORMAL HIGH (ref 6–23)
CO2: 29 mEq/L (ref 19–32)
Calcium: 9.4 mg/dL (ref 8.4–10.5)
Chloride: 103 mEq/L (ref 96–112)
Creatinine, Ser: 1.4 mg/dL (ref 0.40–1.50)
GFR: 47.48 mL/min — ABNORMAL LOW (ref >60.00)
Glucose, Bld: 96 mg/dL (ref 70–99)
Potassium: 4.7 mEq/L (ref 3.5–5.1)
Sodium: 138 mEq/L (ref 135–145)
Total Bilirubin: 0.7 mg/dL (ref 0.2–1.2)
Total Protein: 6.9 g/dL (ref 6.0–8.3)

## 2020-06-27 LAB — URIC ACID: Uric Acid, Serum: 7.1 mg/dL (ref 4.0–7.8)

## 2020-06-27 MED ORDER — CEPHALEXIN 500 MG PO CAPS: 2000.0000 mg | ORAL_CAPSULE | Freq: Once | ORAL | 0 refills | Status: AC

## 2020-06-27 NOTE — Patient Instructions (Signed)
Nice to see you. You do not need the fourth Covid vaccine at this time.  It has not been FDA approved yet.   I sent in Keflex for you to take prior to your dental cleaning. We will get lab work today and contact you with the results.

## 2020-06-27 NOTE — Assessment & Plan Note (Signed)
adequate control at home.  He will continue hydrochlorothiazide 12.5 mg once daily, Imdur 30 mg once daily, and metoprolol 25 mg daily.

## 2020-06-27 NOTE — Assessment & Plan Note (Signed)
Plan to recheck kidney function.  Avoid NSAIDs.  Discussed increasing hydration to 5 glasses of water daily.

## 2020-06-27 NOTE — Assessment & Plan Note (Signed)
Check uric acid and CBC.

## 2020-06-27 NOTE — Assessment & Plan Note (Signed)
No recurrence of symptoms.  He will monitor.  Continue risk factor management.

## 2020-06-27 NOTE — Progress Notes (Signed)
Tommi Rumps, MD Phone: [phone]  Blake Osborne is a 80 y.o. male who presents today for f/u.  HYPERTENSION  Disease Monitoring  Home BP Monitoring 110/70 Chest pain- no    Dyspnea- no Medications  Compliance-  Taking HCTZ, imdur, metoprolol.  Edema- no  Atrial fibrillation: Taking Eliquis.  No palpitations.  No bleeding issues.  He did have a pacemaker placed and this has healed well.  History of stroke: No recurrent stroke issues.  Acute kidney injury: This occurred around the time of his last visit.  He has not been taking any NSAIDs.  He only drinks 3 glasses of water daily.  Patient reports he is getting his teeth cleaned in the near future and the dental school at [facility] suggested that he take antibiotics for the day before and the 2 days after the cleaning given his history.  Check given his    Social History   Tobacco Use  Smoking Status Former Smoker  . Years: 8.00  . Quit date: 10/01/1959  . Years since quitting: 60.7  Smokeless Tobacco Never Used    Current Outpatient Medications on File Prior to Visit  Medication Sig Dispense Refill  . allopurinol (ZYLOPRIM) 100 MG tablet Take 0.5 tablets (50 mg total) by mouth daily. (Patient taking differently: Take 50 mg by mouth daily as needed.) 45 tablet 1  . cetirizine (ZYRTEC) 10 MG tablet Take 10 mg by mouth daily as needed.     Marland Kitchen ELIQUIS 5 MG TABS tablet TAKE 1 TABLET(5 MG) BY MOUTH TWICE DAILY 180 tablet 1  . fluocinonide-emollient (LIDEX-E) 0.05 % cream Apply 1 application topically 2 (two) times daily. 60 g 0  . FLUoxetine (PROZAC) 20 MG capsule TAKE ONE CAPSULE BY MOUTH EVERY DAY 90 capsule 0  . FLUoxetine (PROZAC) 40 MG capsule TAKE 1 CAPSULE(40 MG) BY MOUTH DAILY 90 capsule 1  . gabapentin (NEURONTIN) 100 MG capsule Take 2 capsules (200 mg total) by mouth 2 (two) times daily. 360 capsule 2  . hydrochlorothiazide (MICROZIDE) 12.5 MG capsule TAKE ONE CAPSULE BY MOUTH EVERY DAY 90 capsule 2  .  isosorbide mononitrate (IMDUR) 30 MG 24 hr tablet TAKE 1 TABLET BY MOUTH EVERY DAY 90 tablet 2  . levETIRAcetam (KEPPRA XR) 500 MG 24 hr tablet Take 1,000-1,500 mg by mouth 2 (two) times daily. Take 2 tablets by mouth in the am and 3 tablets by mouth in the pm    . metoprolol succinate (TOPROL-XL) 25 MG 24 hr tablet TAKE 1 TABLET(25 MG) BY MOUTH DAILY 90 tablet 1  . rosuvastatin (CRESTOR) 40 MG tablet TAKE 1 TABLET(40 MG) BY MOUTH DAILY 90 tablet 1   No current facility-administered medications on file prior to visit.     ROS see history of present illness  Objective  Physical Exam Vitals:   06/27/20 1102 06/27/20 1122  BP: 130/80 110/70  Pulse: 87   Temp: 98.3 F (36.8 C)   SpO2: 96%     BP Readings from Last 3 Encounters:  06/27/20 110/70  02/18/20 98/67  10/15/19 107/71   Wt Readings from Last 3 Encounters:  06/27/20 215 lb 9.6 oz (97.8 kg)  02/18/20 214 lb 1.6 oz (97.1 kg)  12/27/19 211 lb (95.7 kg)    Physical Exam Constitutional:      General: He is not in acute distress.    Appearance: He is not diaphoretic.  Cardiovascular:     Rate and Rhythm: Normal rate and regular rhythm.     Heart sounds:  Normal heart sounds.  Pulmonary:     Effort: Pulmonary effort is normal.     Breath sounds: Normal breath sounds.  Musculoskeletal:     Right lower leg: No edema.     Left lower leg: No edema.  Skin:    General: Skin is warm and dry.  Neurological:     Mental Status: He is alert.      Assessment/Plan: Please see individual problem list.  Problem List Items Addressed This Visit    AKI (acute kidney injury) ([place])    Plan to recheck kidney function.  Avoid NSAIDs.  Discussed increasing hydration to 5 glasses of water daily.      Relevant Orders   Comp Met (CMET)   Gout    Check uric acid and CBC.      Relevant Orders   Uric acid   CBC w/Diff   History of stroke    No recurrence of symptoms.  He will monitor.  Continue risk factor management.       Hypertension (Chronic)    adequate control at home.  He will continue hydrochlorothiazide 12.5 mg once daily, Imdur 30 mg once daily, and metoprolol 25 mg daily.      Relevant Orders   Comp Met (CMET)   Paroxysmal atrial fibrillation (HCC)    Sinus rhythm.  He will continue Eliquis 5 mg once daily and metoprolol 25 mg daily.        Dental cleaning: Patient notes the dental school advised him he needed prophylactic antibiotics.  Discussed that 1 dose of antibiotics prior to the dental cleaning is sufficient.  Keflex was sent to his pharmacy for 2 g dose to take 1 hour prior to the dental cleaning. This dosing was reviewed on uptodate.    Health Maintenance: Patient does not meet criteria for fourth Covid vaccine at this time.  Pneumonia vaccine is up-to-date.     This visit occurred during the SARS-CoV-2 public health emergency.  Safety protocols were in place, including screening questions prior to the visit, additional usage of staff PPE, and extensive cleaning of exam room while observing appropriate contact time as indicated for disinfecting solutions.    Tommi Rumps, MD [place]

## 2020-06-27 NOTE — Assessment & Plan Note (Signed)
Sinus rhythm.  He will continue Eliquis 5 mg once daily and metoprolol 25 mg daily.

## 2020-07-06 ENCOUNTER — Other Ambulatory Visit: Payer: Self-pay | Admitting: Family Medicine

## 2020-07-06 DIAGNOSIS — N179 Acute kidney failure, unspecified: Secondary | ICD-10-CM

## 2020-07-18 ENCOUNTER — Other Ambulatory Visit (INDEPENDENT_AMBULATORY_CARE_PROVIDER_SITE_OTHER): Payer: Medicare Other

## 2020-07-18 ENCOUNTER — Telehealth: Payer: Self-pay | Admitting: *Deleted

## 2020-07-18 ENCOUNTER — Other Ambulatory Visit: Payer: Self-pay

## 2020-07-18 DIAGNOSIS — R350 Frequency of micturition: Secondary | ICD-10-CM

## 2020-07-18 DIAGNOSIS — M109 Gout, unspecified: Secondary | ICD-10-CM | POA: Diagnosis not present

## 2020-07-18 DIAGNOSIS — N179 Acute kidney failure, unspecified: Secondary | ICD-10-CM | POA: Diagnosis not present

## 2020-07-18 LAB — POCT URINALYSIS DIPSTICK
Bilirubin, UA: NEGATIVE
Blood, UA: NEGATIVE
Glucose, UA: NEGATIVE
Ketones, UA: NEGATIVE
Leukocytes, UA: NEGATIVE
Nitrite, UA: NEGATIVE
Protein, UA: POSITIVE — AB
Spec Grav, UA: 1.015 (ref 1.010–1.025)
Urobilinogen, UA: 0.2 E.U./dL
pH, UA: 6.5 (ref 5.0–8.0)

## 2020-07-18 LAB — CBC WITH DIFFERENTIAL/PLATELET
Basophils Absolute: 0.1 10*3/uL (ref 0.0–0.1)
Basophils Relative: 0.7 % (ref 0.0–3.0)
Eosinophils Absolute: 0.9 10*3/uL — ABNORMAL HIGH (ref 0.0–0.7)
Eosinophils Relative: 11.8 % — ABNORMAL HIGH (ref 0.0–5.0)
HCT: 42.2 % (ref 39.0–52.0)
Hemoglobin: 14.1 g/dL (ref 13.0–17.0)
Lymphocytes Relative: 17.4 % (ref 12.0–46.0)
Lymphs Abs: 1.3 10*3/uL (ref 0.7–4.0)
MCHC: 33.4 g/dL (ref 30.0–36.0)
MCV: 94.1 fl (ref 78.0–100.0)
Monocytes Absolute: 0.7 10*3/uL (ref 0.1–1.0)
Monocytes Relative: 8.9 % (ref 3.0–12.0)
Neutro Abs: 4.5 10*3/uL (ref 1.4–7.7)
Neutrophils Relative %: 61.2 % (ref 43.0–77.0)
Platelets: 105 10*3/uL — ABNORMAL LOW (ref 150.0–400.0)
RBC: 4.48 Mil/uL (ref 4.22–5.81)
RDW: 13.3 % (ref 11.5–15.5)
WBC: 7.3 10*3/uL (ref 4.0–10.5)

## 2020-07-18 LAB — BASIC METABOLIC PANEL
BUN: 23 mg/dL (ref 6–23)
CO2: 30 mEq/L (ref 19–32)
Calcium: 9.2 mg/dL (ref 8.4–10.5)
Chloride: 103 mEq/L (ref 96–112)
Creatinine, Ser: 1.26 mg/dL (ref 0.40–1.50)
GFR: 53.86 mL/min — ABNORMAL LOW (ref >60.00)
Glucose, Bld: 111 mg/dL — ABNORMAL HIGH (ref 70–99)
Potassium: 4.6 mEq/L (ref 3.5–5.1)
Sodium: 138 mEq/L (ref 135–145)

## 2020-07-18 LAB — URIC ACID: Uric Acid, Serum: 6.6 mg/dL (ref 4.0–7.8)

## 2020-07-18 NOTE — Telephone Encounter (Signed)
Urine ordered. Coralee North, can you call the patient and see if he has any other urine symptoms? Is the urine frequency new or ongoing?

## 2020-07-18 NOTE — Telephone Encounter (Signed)
Patient has symptoms of urine frequency and this has been an ongoing issue.  Cotey Rakes,cma

## 2020-07-18 NOTE — Telephone Encounter (Signed)
Pt had labs this morning & family member requested a urine & stated that provider is aware of his urinary frequency. I gave pt a cup & had him collect a urine with no guarantee that anything will be ran.  If you would like to order any urine test, please place future order.

## 2020-07-18 NOTE — Addendum Note (Signed)
Addended by: Warden Fillers on: 07/18/2020 02:15 PM   Modules accepted: Orders

## 2020-07-30 ENCOUNTER — Other Ambulatory Visit: Payer: Self-pay | Admitting: Family Medicine

## 2020-07-30 DIAGNOSIS — M109 Gout, unspecified: Secondary | ICD-10-CM

## 2020-08-10 DIAGNOSIS — Z23 Encounter for immunization: Secondary | ICD-10-CM | POA: Diagnosis not present

## 2020-08-12 ENCOUNTER — Other Ambulatory Visit: Payer: Self-pay | Admitting: Family Medicine

## 2020-08-20 ENCOUNTER — Other Ambulatory Visit: Payer: Self-pay | Admitting: Family Medicine

## 2020-08-21 ENCOUNTER — Inpatient Hospital Stay: Payer: Medicare Other

## 2020-08-21 ENCOUNTER — Other Ambulatory Visit: Payer: Self-pay | Admitting: Family Medicine

## 2020-08-24 ENCOUNTER — Inpatient Hospital Stay: Payer: Medicare Other | Admitting: Oncology

## 2020-09-01 DIAGNOSIS — Z950 Presence of cardiac pacemaker: Secondary | ICD-10-CM | POA: Diagnosis not present

## 2020-09-01 DIAGNOSIS — I442 Atrioventricular block, complete: Secondary | ICD-10-CM | POA: Diagnosis not present

## 2020-09-01 DIAGNOSIS — Z45018 Encounter for adjustment and management of other part of cardiac pacemaker: Secondary | ICD-10-CM | POA: Diagnosis not present

## 2020-09-06 ENCOUNTER — Ambulatory Visit: Payer: Medicare Other

## 2020-09-26 ENCOUNTER — Telehealth: Payer: Self-pay | Admitting: Family Medicine

## 2020-09-26 NOTE — Telephone Encounter (Signed)
Left message for patient to call back and schedule Medicare Annual Wellness Visit (AWV) in office.   If not able to come in office, please offer to do virtually or by telephone.   Last AWV:09/06/2019  Please schedule at anytime with Nurse Health Advisor.

## 2020-10-12 ENCOUNTER — Telehealth: Payer: Self-pay | Admitting: Family Medicine

## 2020-10-12 NOTE — Telephone Encounter (Signed)
Left message for patient to call back and schedule Medicare Annual Wellness Visit (AWV) in office.   If not able to come in office, please offer to do virtually or by telephone.   Last AWV:09/06/2019  Please schedule at anytime with Nurse Health Advisor.   

## 2020-10-15 ENCOUNTER — Other Ambulatory Visit: Payer: Self-pay | Admitting: Family Medicine

## 2020-10-31 ENCOUNTER — Other Ambulatory Visit: Payer: Self-pay | Admitting: Family Medicine

## 2020-11-18 ENCOUNTER — Other Ambulatory Visit: Payer: Self-pay | Admitting: Family Medicine

## 2020-11-20 NOTE — Telephone Encounter (Signed)
Refilled: 12/27/2019 Last OV: 06/27/2020 Next OV: 01/02/2021

## 2020-11-22 ENCOUNTER — Other Ambulatory Visit: Payer: Self-pay | Admitting: Family Medicine

## 2020-11-24 ENCOUNTER — Other Ambulatory Visit: Payer: Self-pay | Admitting: Family Medicine

## 2020-12-04 DIAGNOSIS — I442 Atrioventricular block, complete: Secondary | ICD-10-CM | POA: Diagnosis not present

## 2020-12-04 DIAGNOSIS — Z950 Presence of cardiac pacemaker: Secondary | ICD-10-CM | POA: Diagnosis not present

## 2020-12-04 DIAGNOSIS — Z45018 Encounter for adjustment and management of other part of cardiac pacemaker: Secondary | ICD-10-CM | POA: Diagnosis not present

## 2020-12-22 DIAGNOSIS — Z23 Encounter for immunization: Secondary | ICD-10-CM | POA: Diagnosis not present

## 2021-01-02 ENCOUNTER — Ambulatory Visit: Payer: Medicare Other | Admitting: Family Medicine

## 2021-01-27 ENCOUNTER — Other Ambulatory Visit: Payer: Self-pay | Admitting: Family Medicine

## 2021-01-27 DIAGNOSIS — M109 Gout, unspecified: Secondary | ICD-10-CM

## 2021-02-05 ENCOUNTER — Other Ambulatory Visit: Payer: Self-pay | Admitting: Family Medicine

## 2021-02-05 NOTE — Telephone Encounter (Signed)
Patient requesting a refill on his gabapentin (NEURONTIN) 100 MG capsule

## 2021-02-05 NOTE — Telephone Encounter (Signed)
Called [facility] to check on the gabapentin prescription. Pt picked up their prescription on August 9th and the next one is set to be picked up on November 5th.   Called to inform the patient that their medication will be available November 5th and to ask if they are out of their medication. Spoke to Blake Osborne's wife Scientist, product/process development. She states that he is not out of his gabapentin but is planning a trip to [place] for a couple of weeks and does not think he will have enough to last and wanted a short term refill sent in to [facility] for them to pick up today and have on hand. She states that he has enough of his other medication to last him for the trip. Ok to send in a 30 day supply of Gabapentin? Medication has been pended.

## 2021-02-05 NOTE — Addendum Note (Signed)
Addended by: Hulan Fray on: 02/05/2021 04:00 PM   Modules accepted: Orders

## 2021-02-06 MED ORDER — GABAPENTIN 100 MG PO CAPS: ORAL_CAPSULE | ORAL | 0 refills | Status: DC

## 2021-02-12 DIAGNOSIS — T50905A Adverse effect of unspecified drugs, medicaments and biological substances, initial encounter: Secondary | ICD-10-CM | POA: Diagnosis not present

## 2021-02-12 DIAGNOSIS — G40109 Localization-related (focal) (partial) symptomatic epilepsy and epileptic syndromes with simple partial seizures, not intractable, without status epilepticus: Secondary | ICD-10-CM | POA: Diagnosis not present

## 2021-02-12 DIAGNOSIS — Z9189 Other specified personal risk factors, not elsewhere classified: Secondary | ICD-10-CM | POA: Diagnosis not present

## 2021-02-26 ENCOUNTER — Other Ambulatory Visit: Payer: Self-pay | Admitting: Family Medicine

## 2021-03-05 DIAGNOSIS — Z950 Presence of cardiac pacemaker: Secondary | ICD-10-CM | POA: Diagnosis not present

## 2021-03-05 DIAGNOSIS — Z45018 Encounter for adjustment and management of other part of cardiac pacemaker: Secondary | ICD-10-CM | POA: Diagnosis not present

## 2021-03-05 DIAGNOSIS — I442 Atrioventricular block, complete: Secondary | ICD-10-CM | POA: Diagnosis not present

## 2021-03-22 ENCOUNTER — Other Ambulatory Visit: Payer: Self-pay | Admitting: Family Medicine

## 2021-05-21 ENCOUNTER — Other Ambulatory Visit: Payer: Self-pay | Admitting: Family Medicine

## 2021-05-23 ENCOUNTER — Other Ambulatory Visit: Payer: Self-pay | Admitting: Family Medicine

## 2021-06-02 ENCOUNTER — Other Ambulatory Visit: Payer: Self-pay | Admitting: Family Medicine

## 2021-06-04 ENCOUNTER — Other Ambulatory Visit: Payer: Self-pay | Admitting: Family Medicine

## 2021-06-04 NOTE — Telephone Encounter (Signed)
PT needs to schedule a follow up for further refills

## 2021-06-29 ENCOUNTER — Other Ambulatory Visit: Payer: Self-pay | Admitting: Family Medicine

## 2021-07-02 ENCOUNTER — Other Ambulatory Visit: Payer: Self-pay | Admitting: Family Medicine

## 2021-07-02 ENCOUNTER — Telehealth: Payer: Self-pay | Admitting: Family Medicine

## 2021-07-02 DIAGNOSIS — Z950 Presence of cardiac pacemaker: Secondary | ICD-10-CM | POA: Diagnosis not present

## 2021-07-02 DIAGNOSIS — Z45018 Encounter for adjustment and management of other part of cardiac pacemaker: Secondary | ICD-10-CM | POA: Diagnosis not present

## 2021-07-02 DIAGNOSIS — I442 Atrioventricular block, complete: Secondary | ICD-10-CM | POA: Diagnosis not present

## 2021-07-02 NOTE — Telephone Encounter (Signed)
The patient requested a refill on his Prozac.  Its been a year since he has been seen.  I will provide 1 more refill though he needs to be scheduled for follow-up to receive further refills.  Thanks. ?

## 2021-07-03 NOTE — Telephone Encounter (Signed)
I called and spoke with the patients wife and the patient is scheduled to see the provider on 07/18/2021.  Derral Colucci,cma  ?

## 2021-07-04 ENCOUNTER — Other Ambulatory Visit: Payer: Self-pay | Admitting: Family Medicine

## 2021-07-17 ENCOUNTER — Ambulatory Visit: Payer: Medicare Other | Admitting: Family Medicine

## 2021-07-18 ENCOUNTER — Encounter: Payer: Self-pay | Admitting: Family Medicine

## 2021-07-18 ENCOUNTER — Ambulatory Visit (INDEPENDENT_AMBULATORY_CARE_PROVIDER_SITE_OTHER): Payer: Medicare Other | Admitting: Family Medicine

## 2021-07-18 VITALS — BP 118/78 | HR 84 | Temp 97.6°F | Ht 73.0 in | Wt 212.0 lb

## 2021-07-18 DIAGNOSIS — E785 Hyperlipidemia, unspecified: Secondary | ICD-10-CM | POA: Diagnosis not present

## 2021-07-18 DIAGNOSIS — L309 Dermatitis, unspecified: Secondary | ICD-10-CM

## 2021-07-18 DIAGNOSIS — G40909 Epilepsy, unspecified, not intractable, without status epilepticus: Secondary | ICD-10-CM

## 2021-07-18 DIAGNOSIS — R7309 Other abnormal glucose: Secondary | ICD-10-CM | POA: Diagnosis not present

## 2021-07-18 DIAGNOSIS — I10 Essential (primary) hypertension: Secondary | ICD-10-CM

## 2021-07-18 DIAGNOSIS — I480 Paroxysmal atrial fibrillation: Secondary | ICD-10-CM | POA: Diagnosis not present

## 2021-07-18 LAB — LIPID PANEL
Cholesterol: 113 mg/dL (ref 0–200)
HDL: 43.6 mg/dL (ref >39.00)
LDL Cholesterol: 40 mg/dL (ref 0–99)
NonHDL: 69.68
Total CHOL/HDL Ratio: 3
Triglycerides: 147 mg/dL (ref 0.0–149.0)
VLDL: 29.4 mg/dL (ref 0.0–40.0)

## 2021-07-18 LAB — CBC
HCT: 41.9 % (ref 39.0–52.0)
Hemoglobin: 13.9 g/dL (ref 13.0–17.0)
MCHC: 33.2 g/dL (ref 30.0–36.0)
MCV: 95.5 fl (ref 78.0–100.0)
Platelets: 111 10*3/uL — ABNORMAL LOW (ref 150.0–400.0)
RBC: 4.38 Mil/uL (ref 4.22–5.81)
RDW: 13.3 % (ref 11.5–15.5)
WBC: 7.6 10*3/uL (ref 4.0–10.5)

## 2021-07-18 LAB — COMPREHENSIVE METABOLIC PANEL
ALT: 31 U/L (ref 0–53)
AST: 38 U/L — ABNORMAL HIGH (ref 0–37)
Albumin: 4.3 g/dL (ref 3.5–5.2)
Alkaline Phosphatase: 62 U/L (ref 39–117)
BUN: 25 mg/dL — ABNORMAL HIGH (ref 6–23)
CO2: 29 mEq/L (ref 19–32)
Calcium: 9.5 mg/dL (ref 8.4–10.5)
Chloride: 104 mEq/L (ref 96–112)
Creatinine, Ser: 1.28 mg/dL (ref 0.40–1.50)
GFR: 52.48 mL/min — ABNORMAL LOW (ref >60.00)
Glucose, Bld: 106 mg/dL — ABNORMAL HIGH (ref 70–99)
Potassium: 4.2 mEq/L (ref 3.5–5.1)
Sodium: 138 mEq/L (ref 135–145)
Total Bilirubin: 0.9 mg/dL (ref 0.2–1.2)
Total Protein: 6.9 g/dL (ref 6.0–8.3)

## 2021-07-18 LAB — HEMOGLOBIN A1C: Hgb A1c MFr Bld: 6.4 % (ref 4.6–6.5)

## 2021-07-18 MED ORDER — HYDROCHLOROTHIAZIDE 12.5 MG PO CAPS: 12.5000 mg | ORAL_CAPSULE | Freq: Every day | ORAL | 2 refills | Status: DC

## 2021-07-18 MED ORDER — FLUOCINONIDE EMULSIFIED BASE 0.05 % EX CREA: 1.0000 "application " | TOPICAL_CREAM | Freq: Every day | CUTANEOUS | 0 refills | Status: DC | PRN

## 2021-07-18 NOTE — Assessment & Plan Note (Signed)
Well-controlled.  He will continue hydrochlorothiazide 12.5 mg daily, Imdur 30 mg daily, metoprolol 25 mg daily.  Check labs. ?

## 2021-07-18 NOTE — Assessment & Plan Note (Signed)
Chronic issue.  Lidex will be refilled.  Discussed that he should not use this for no longer than 2 weeks at a time and then he needs to take at least a few days off from using it.  Advised the risk of skin hypopigmentation and skin atrophy with chronic use.  Advised not to get this in his eyes as there could be damage to his cornea or issues with glaucoma from this topical treatment if it gets in his eyes. ?

## 2021-07-18 NOTE — Assessment & Plan Note (Signed)
Continue Crestor 40 mg once daily.  Check labs. °

## 2021-07-18 NOTE — Patient Instructions (Signed)
Nice to see you. ?We will check lab work today. ?Please do not get the Lidex in your eyes as it can result in damage to them or glaucoma.  You should wash your hands every time you use this.  You should not use this medication for more than 2 weeks at a time.  If you use it for that long you need to take at least a few day break from this topical treatment. ?

## 2021-07-18 NOTE — Assessment & Plan Note (Signed)
He will continue management through neurology. 

## 2021-07-18 NOTE — Assessment & Plan Note (Signed)
Sinus rhythm.  He will continue metoprolol 25 mg daily and Eliquis 5 mg twice daily.  Check CBC. ?

## 2021-07-18 NOTE — Progress Notes (Signed)
?Tommi Rumps, MD ?Phone: [phone] ? ?Blake Osborne is a 81 y.o. male who presents today for f/u. ? ?HYPERTENSION ?Disease Monitoring ?Home BP Monitoring not checking Chest pain- no    Dyspnea- no ?Medications ?Compliance-  taking HCTZ, imdur, metoprolol.   Edema- no ?BMET ?   ?Component Value Date/Time  ? NA 138 07/18/2020 1000  ? NA 139 07/25/2010 0800  ? K 4.6 07/18/2020 1000  ? CL 103 07/18/2020 1000  ? CO2 30 07/18/2020 1000  ? GLUCOSE 111 (H) 07/18/2020 1000  ? BUN 23 07/18/2020 1000  ? BUN 23 (A) 07/25/2010 0800  ? CREATININE 1.26 07/18/2020 1000  ? CALCIUM 9.2 07/18/2020 1000  ? GFRNONAA 44 (L) 10/01/2019 1155  ? GFRAA 51 (L) 10/01/2019 1155  ? ?Atrial fibrillation: Taking Eliquis.  No palpitations.  No bleeding issues.  Reports he has a new pacemaker. ? ?History of seizures: He continues to see neurology.  He is on Keppra.  He notes no recent seizures. ? ?Dermatitis: Patient reports he continues to use Lidex intermittently on his forehead and cheeks.  He uses Aveeno elsewhere.  He notes he saw dermatology in the past for this and was advised that this was age-related.  He does take a break from using the Lidex periodically. ? ?Social History  ? ?Tobacco Use  ?Smoking Status Former  ? Years: 8.00  ? Types: Cigarettes  ? Quit date: 10/01/1959  ? Years since quitting: 61.8  ?Smokeless Tobacco Never  ? ? ?Current Outpatient Medications on File Prior to Visit  ?Medication Sig Dispense Refill  ? allopurinol (ZYLOPRIM) 100 MG tablet TAKE 1/2 TABLET(50 MG) BY MOUTH DAILY 45 tablet 1  ? cetirizine (ZYRTEC) 10 MG tablet Take 10 mg by mouth daily as needed.     ? ELIQUIS 5 MG TABS tablet TAKE 1 TABLET(5 MG) BY MOUTH TWICE DAILY 180 tablet 1  ? FLUoxetine (PROZAC) 40 MG capsule Take 1 capsule (40 mg total) by mouth daily. Needs an appointment for further refills. 90 capsule 0  ? gabapentin (NEURONTIN) 100 MG capsule TAKE 2 CAPSULES(200 MG) BY MOUTH TWICE DAILY 120 capsule 0  ? isosorbide mononitrate  (IMDUR) 30 MG 24 hr tablet TAKE 1 TABLET BY MOUTH EVERY DAY 90 tablet 2  ? levETIRAcetam (KEPPRA XR) 500 MG 24 hr tablet Take 1,000-1,500 mg by mouth 2 (two) times daily. Take 2 tablets by mouth in the am and 3 tablets by mouth in the pm    ? metoprolol succinate (TOPROL-XL) 25 MG 24 hr tablet TAKE 1 TABLET(25 MG) BY MOUTH DAILY 90 tablet 0  ? rosuvastatin (CRESTOR) 40 MG tablet TAKE 1 TABLET(40 MG) BY MOUTH DAILY 90 tablet 1  ? ?No current facility-administered medications on file prior to visit.  ? ? ? ?ROS see history of present illness ? ?Objective ? ?Physical Exam ?Vitals:  ? 07/18/21 1139  ?BP: 118/78  ?Pulse: 84  ?Temp: 97.6 ?F (36.4 ?C)  ?SpO2: 96%  ? ? ?BP Readings from Last 3 Encounters:  ?07/18/21 118/78  ?06/27/20 110/70  ?02/18/20 98/67  ? ?Wt Readings from Last 3 Encounters:  ?07/18/21 212 lb (96.2 kg)  ?06/27/20 215 lb 9.6 oz (97.8 kg)  ?02/18/20 214 lb 1.6 oz (97.1 kg)  ? ? ?Physical Exam ?Constitutional:   ?   General: He is not in acute distress. ?   Appearance: He is not diaphoretic.  ?Cardiovascular:  ?   Rate and Rhythm: Normal rate and regular rhythm.  ?   Heart sounds:  Normal heart sounds.  ?Pulmonary:  ?   Effort: Pulmonary effort is normal.  ?   Breath sounds: Normal breath sounds.  ?Skin: ?   General: Skin is warm and dry.  ?   Comments: Seborrheic keratosis on his left cheek, no significant skin lesions noted on his forehead or right cheek, he does have telangiectasias over his forehead and cheeks  ?Neurological:  ?   Mental Status: He is alert.  ? ? ? ?Assessment/Plan: Please see individual problem list. ? ?Problem List Items Addressed This Visit   ? ? Dermatitis (Chronic)  ?  Chronic issue.  Lidex will be refilled.  Discussed that he should not use this for no longer than 2 weeks at a time and then he needs to take at least a few days off from using it.  Advised the risk of skin hypopigmentation and skin atrophy with chronic use.  Advised not to get this in his eyes as there could be  damage to his cornea or issues with glaucoma from this topical treatment if it gets in his eyes. ?  ?  ? Relevant Medications  ? fluocinonide-emollient (LIDEX-E) 0.05 % cream  ? Hyperlipidemia (Chronic)  ?  Continue Crestor 40 mg once daily.  Check labs. ?  ?  ? Relevant Medications  ? hydrochlorothiazide (MICROZIDE) 12.5 MG capsule  ? Other Relevant Orders  ? Comp Met (CMET)  ? Lipid panel  ? Hypertension (Chronic)  ?  Well-controlled.  He will continue hydrochlorothiazide 12.5 mg daily, Imdur 30 mg daily, metoprolol 25 mg daily.  Check labs. ?  ?  ? Relevant Medications  ? hydrochlorothiazide (MICROZIDE) 12.5 MG capsule  ? Other Relevant Orders  ? Comp Met (CMET)  ? Lipid panel  ? Paroxysmal atrial fibrillation (HCC) (Chronic)  ?  Sinus rhythm.  He will continue metoprolol 25 mg daily and Eliquis 5 mg twice daily.  Check CBC. ?  ?  ? Relevant Medications  ? hydrochlorothiazide (MICROZIDE) 12.5 MG capsule  ? Other Relevant Orders  ? CBC  ? Seizure disorder (HCC) (Chronic)  ?  He will continue management through neurology. ?  ?  ? ?Other Visit Diagnoses   ? ? Elevated glucose    -  Primary  ? Relevant Orders  ? HgB A1c  ? ?  ? ? ?Return in about 6 months (around 01/17/2022) for Medication follow-up. ? ?This visit occurred during the SARS-CoV-2 public health emergency.  Safety protocols were in place, including screening questions prior to the visit, additional usage of staff PPE, and extensive cleaning of exam room while observing appropriate contact time as indicated for disinfecting solutions.  ? ? ?Tommi Rumps, MD ?[place] ? ?

## 2021-07-21 ENCOUNTER — Other Ambulatory Visit: Payer: Self-pay | Admitting: Family Medicine

## 2021-07-21 DIAGNOSIS — M109 Gout, unspecified: Secondary | ICD-10-CM

## 2021-07-31 ENCOUNTER — Encounter: Payer: Self-pay | Admitting: Family Medicine

## 2021-07-31 ENCOUNTER — Ambulatory Visit (INDEPENDENT_AMBULATORY_CARE_PROVIDER_SITE_OTHER): Payer: Medicare Other

## 2021-07-31 VITALS — Ht 73.0 in | Wt 212.0 lb

## 2021-07-31 DIAGNOSIS — Z0000 Encounter for general adult medical examination without abnormal findings: Secondary | ICD-10-CM | POA: Diagnosis not present

## 2021-07-31 NOTE — Patient Instructions (Addendum)
?  Mr. Cammack , ?Thank you for taking time to come for your Medicare Wellness Visit. I appreciate your ongoing commitment to your health goals. Please review the following plan we discussed and let me know if I can assist you in the future.  ? ?These are the goals we discussed: ? Goals   ? ?  Increase water intake   ?  Stay hydrated. ?  ? ?  ?  ?This is a list of the screening recommended for you and due dates:  ?Health Maintenance  ?Topic Date Due  ? COVID-19 Vaccine (4 - Booster for Pfizer series) 08/16/2021*  ? Zoster (Shingles) Vaccine (1 of 2) 10/30/2021*  ? Tetanus Vaccine  08/01/2022*  ? Flu Shot  11/13/2021  ? Pneumonia Vaccine  Completed  ? HPV Vaccine  Aged Out  ?*Topic was postponed. The date shown is not the original due date.  ?  ?

## 2021-07-31 NOTE — Progress Notes (Signed)
Subjective:   Macky Massoth is a 81 y.o. male who presents for Medicare Annual/Subsequent preventive examination.  Review of Systems    No ROS.  Medicare Wellness Virtual Visit.  Visual/audio telehealth visit, UTA vital signs.   See social history for additional risk factors.   Cardiac Risk Factors include: advanced age (>86men, >2 women)     Objective:    Today's Vitals   07/31/21 0940  Weight: 212 lb (96.2 kg)  Height: 6\' 1"  (1.854 m)   Body mass index is 27.97 kg/m.     07/31/2021    9:50 AM 10/15/2019    9:09 AM 10/01/2019   10:54 AM 09/06/2019    9:12 AM 09/03/2018   10:29 AM 10/07/2016    9:28 AM 04/06/2016   10:39 AM  Advanced Directives  Does Patient Have a Medical Advance Directive? Yes Yes Yes Yes Yes Yes Yes  Type of Estate agent of [place];Living will Living will;Healthcare Power of Attorney Living will;Healthcare Power of [employer] Power of [place];Living will Healthcare Power of [place];Living will Living will;Healthcare Power of Attorney Living will  Does patient want to make changes to medical advance directive? No - Patient declined    No - Patient declined No - Patient declined   Copy of Healthcare Power of Attorney in Chart? No - copy requested   No - copy requested No - copy requested No - copy requested     Current Medications (verified) Outpatient Encounter Medications as of 07/31/2021  Medication Sig   allopurinol (ZYLOPRIM) 100 MG tablet TAKE 1/2 TABLET(50 MG) BY MOUTH DAILY   cetirizine (ZYRTEC) 10 MG tablet Take 10 mg by mouth daily as needed.    ELIQUIS 5 MG TABS tablet TAKE 1 TABLET(5 MG) BY MOUTH TWICE DAILY   fluocinonide-emollient (LIDEX-E) 0.05 % cream Apply 1 application. topically daily as needed.   FLUoxetine (PROZAC) 40 MG capsule Take 1 capsule (40 mg total) by mouth daily. Needs an appointment for further refills.   gabapentin (NEURONTIN) 100 MG capsule TAKE 2 CAPSULES(200 MG) BY MOUTH TWICE  DAILY   hydrochlorothiazide (MICROZIDE) 12.5 MG capsule Take 1 capsule (12.5 mg total) by mouth daily.   isosorbide mononitrate (IMDUR) 30 MG 24 hr tablet TAKE 1 TABLET BY MOUTH EVERY DAY   levETIRAcetam (KEPPRA XR) 500 MG 24 hr tablet Take 1,000-1,500 mg by mouth 2 (two) times daily. Take 2 tablets by mouth in the am and 3 tablets by mouth in the pm   metoprolol succinate (TOPROL-XL) 25 MG 24 hr tablet TAKE 1 TABLET(25 MG) BY MOUTH DAILY   rosuvastatin (CRESTOR) 40 MG tablet TAKE 1 TABLET(40 MG) BY MOUTH DAILY   No facility-administered encounter medications on file as of 07/31/2021.   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   Hyperlipidemia    Hypertension    Left BB block NEC    Pacemaker    Thrombocytopenia (HCC)    Past Surgical History:  Procedure Laterality Date   CARDIAC SURGERY     CORONARY ARTERY BYPASS GRAFT     PACEMAKER GENERATOR CHANGE     Family History  Problem Relation Age of Onset   Heart disease Father    Heart attack Father    Aneurysm Brother  heart   Social History   Socioeconomic History   Marital status: Married    Spouse name: Not on file   Number of children: Not on file   Years of education: Not on file   Highest education level: Not on file  Occupational History   Not on file  Tobacco Use   Smoking status: Former    Years: 8.00    Types: Cigarettes    Quit date: 10/01/1959    Years since quitting: 61.8   Smokeless tobacco: Never  Vaping Use   Vaping Use: Never used  Substance and Sexual Activity   Alcohol use: Not Currently    Alcohol/week: 1.0 standard drink    Types: 1 Glasses of wine per week    Comment: occasional glass of wine   Drug use: No   Sexual activity: Not on file  Other  Topics Concern   Not on file  Social History Narrative   Not on file   Social Determinants of Health   Financial Resource Strain: Low Risk    Difficulty of Paying Living Expenses: Not hard at all  Food Insecurity: No Food Insecurity   Worried About Programme researcher, broadcasting/film/video in the Last Year: Never true   Ran Out of Food in the Last Year: Never true  Transportation Needs: No Transportation Needs   Lack of Transportation (Medical): No   Lack of Transportation (Non-Medical): No  Physical Activity: Insufficiently Active   Days of Exercise per Week: 4 days   Minutes of Exercise per Session: 20 min  Stress: No Stress Concern Present   Feeling of Stress : Not at all  Social Connections: Unknown   Frequency of Communication with Friends and Family: Three times a week   Frequency of Social Gatherings with Friends and Family: Three times a week   Attends Religious Services: Not on file   Active Member of Clubs or Organizations: Not on file   Attends Banker Meetings: Not on file   Marital Status: Married    Tobacco Counseling Counseling given: Not Answered   Clinical Intake:  Pre-visit preparation completed: Yes        Diabetes: No  How often do you need to have someone help you when you read instructions, pamphlets, or other written materials from your doctor or pharmacy?: 1 - Never   Interpreter Needed?: No      Activities of Daily Living    07/31/2021   10:02 AM  In your present state of health, do you have any difficulty performing the following activities:  Hearing? 1  Comment Hearing aids  Vision? 0  Difficulty concentrating or making decisions? 0  Walking or climbing stairs? 0  Dressing or bathing? 0  Doing errands, shopping? 1  Comment Family Ship broker and eating ? N  Using the Toilet? N  In the past six months, have you accidently leaked urine? N  Do you have problems with loss of bowel control? N  Managing your Medications? Y   Comment Son and wife assist  Managing your Finances? N  Housekeeping or managing your Housekeeping? N    Patient Care Team: [name], MD as PCP - General (Family Medicine)  Indicate any recent Medical Services you may have received from other than [facility] providers in the past year (date may be approximate).     Assessment:   This is a routine wellness examination for Darth.  Virtual Visit via Telephone Note  I connected with  Beola Cord on 07/31/21 at  9:30 AM EDT by telephone and verified that I am speaking with the correct person using two identifiers.  Persons participating in the virtual visit: patient/Nurse Health Advisor   I discussed the limitations of performing an evaluation and management service by telehealth. The patient expressed understanding and agreed to proceed. We continued and completed visit with audio only. Some vital signs may be absent or patient reported.   Hearing/Vision screen Hearing Screening - Comments:: Followed by [facility] every 2 years Hearing aid, bilateral Vision Screening - Comments:: Followed by [facility] Wears corrective lenses  Cataract extraction, bilateral They have seen their ophthalmologist in the last 12 months.  Dietary issues and exercise activities discussed: Current Exercise Habits: Home exercise routine, Intensity: Mild Healthy diet Fair water intake   Goals Addressed             This Visit's Progress    Increase water intake       Stay hydrated.       Depression Screen    07/31/2021    9:41 AM 07/18/2021   11:40 AM 06/27/2020   11:03 AM 12/27/2019    9:26 AM 12/27/2019    9:25 AM 09/06/2019    9:15 AM 09/03/2018   10:22 AM  PHQ 2/9 Scores  PHQ - 2 Score 0 0 0 0 0 0 0    Fall Risk    07/31/2021   10:02 AM 07/18/2021   11:40 AM 06/27/2020   11:03 AM 12/27/2019    9:25 AM 09/06/2019    9:14 AM  Fall Risk   Falls in the past year? 0 0 0 0 0  Number falls in past yr:  0 0 0   Injury  with Fall?  0     Risk for fall due to :  No Fall Risks     Follow up Falls evaluation completed Falls evaluation completed Falls evaluation completed Falls evaluation completed Falls evaluation completed    FALL RISK PREVENTION PERTAINING TO THE HOME: Home free of loose throw rugs in walkways, pet beds, electrical cords, etc? Yes  Adequate lighting in your home to reduce risk of falls? Yes   ASSISTIVE DEVICES UTILIZED TO PREVENT FALLS: Use of a cane, walker or w/c? No   TIMED UP AND GO: Was the test performed? No .   Cognitive Function: Patient is alert and oriented x3.     09/06/2019    9:16 AM 10/07/2016    9:39 AM  MMSE - Mini Mental State Exam  Not completed: Unable to complete   Orientation to time  5  Orientation to Place  5  Registration  3  Attention/ Calculation  5  Recall  3  Language- name 2 objects  2  Language- repeat  1  Language- follow 3 step command  3  Language- read & follow direction  1  Write a sentence  1  Copy design  1  Total score  30        09/03/2018   10:24 AM  6CIT Screen  What Year? 0 points  What month? 0 points  What time? 0 points  Count back from 20 0 points  Months in reverse 0 points  Repeat phrase 0 points  Total Score 0 points    Immunizations Immunization History  Administered Date(s) Administered   Influenza Split 01/14/2011, 01/16/2012, 02/08/2014   Influenza, High Dose Seasonal PF 01/05/2019   Influenza-Unspecified 03/15/2015, 01/01/2018   PFIZER(Purple  Top)SARS-COV-2 Vaccination 05/07/2019, 05/28/2019   Pfizer Covid-19 Vaccine Bivalent Booster 40yrs & up 11/30/2020   Pneumococcal Conjugate-13 02/15/2014   Pneumococcal Polysaccharide-23 06/15/2008   Zoster, Live 06/16/2010    TDAP status: Due, Education has been provided regarding the importance of this vaccine. Advised may receive this vaccine at local pharmacy or Health Dept. Aware to provide a copy of the vaccination record if obtained from local pharmacy or  Health Dept. Verbalized acceptance and understanding.  Screening Tests Health Maintenance  Topic Date Due   COVID-19 Vaccine (4 - Booster for Pfizer series) 08/16/2021 (Originally 01/25/2021)   Zoster Vaccines- Shingrix (1 of 2) 10/30/2021 (Originally 01/10/1960)   TETANUS/TDAP  08/01/2022 (Originally 01/10/1960)   INFLUENZA VACCINE  11/13/2021   Pneumonia Vaccine 69+ Years old  Completed   HPV VACCINES  Aged Out   Health Maintenance There are no preventive care reminders to display for this patient.  Shingles vaccine-  Aware to provide a copy of the vaccination record if obtained from local pharmacy or Health Dept. Verbalized acceptance and understanding.  Hepatitis C Screening: does not qualify  Vision Screening: Recommended annual ophthalmology exams for early detection of glaucoma and other disorders of the eye.  Dental Screening: Recommended annual dental exams for proper oral hygiene  Community Resource Referral / Chronic Care Management: CRR required this visit?  No   CCM required this visit?  No      Plan:     I have personally reviewed and noted the following in the patient's chart:   Medical and social history Use of alcohol, tobacco or illicit drugs  Current medications and supplements including opioid prescriptions. Patient is not currently taking opioid prescriptions. Functional ability and status Nutritional status Physical activity Advanced directives List of other physicians Hospitalizations, surgeries, and ER visits in previous 12 months Vitals Screenings to include cognitive, depression, and falls Referrals and appointments  In addition, I have reviewed and discussed with patient certain preventive protocols, quality metrics, and best practice recommendations. A written personalized care plan for preventive services as well as general preventive health recommendations were provided to patient.     Ashok Pall, LPN   1/61/0960

## 2021-08-01 MED ORDER — TRIAMCINOLONE ACETONIDE 0.5 % EX CREA: 1.0000 "application " | TOPICAL_CREAM | Freq: Every day | CUTANEOUS | 0 refills | Status: DC | PRN

## 2021-08-15 DIAGNOSIS — Z23 Encounter for immunization: Secondary | ICD-10-CM | POA: Diagnosis not present

## 2021-09-05 ENCOUNTER — Other Ambulatory Visit: Payer: Self-pay | Admitting: Family

## 2021-09-07 ENCOUNTER — Other Ambulatory Visit: Payer: Self-pay | Admitting: Family Medicine

## 2021-09-07 NOTE — Telephone Encounter (Signed)
This has not been filled in 7 months. Has the patient been taking this daily? Or is he trying to restart on this?

## 2021-09-07 NOTE — Telephone Encounter (Signed)
S/W pt wife - last fill was February 2023, #360 pills filled by you.  Confirmed with pt wife that he is still on medication.  Takes it daily.

## 2021-10-02 DIAGNOSIS — Z45018 Encounter for adjustment and management of other part of cardiac pacemaker: Secondary | ICD-10-CM | POA: Diagnosis not present

## 2021-10-02 DIAGNOSIS — Z950 Presence of cardiac pacemaker: Secondary | ICD-10-CM | POA: Diagnosis not present

## 2021-10-02 DIAGNOSIS — I442 Atrioventricular block, complete: Secondary | ICD-10-CM | POA: Diagnosis not present

## 2021-10-13 ENCOUNTER — Other Ambulatory Visit: Payer: Self-pay | Admitting: Family Medicine

## 2021-11-09 ENCOUNTER — Telehealth: Payer: Self-pay | Admitting: Family Medicine

## 2021-11-09 DIAGNOSIS — I10 Essential (primary) hypertension: Secondary | ICD-10-CM

## 2021-11-09 MED ORDER — METOPROLOL SUCCINATE ER 25 MG PO TB24: ORAL_TABLET | ORAL | 3 refills | Status: DC

## 2021-11-09 NOTE — Telephone Encounter (Signed)
Pt wife called in requesting for refill on medication (metoprolol succinate (TOPROL-XL) 25 MG 24 hr tablet).... Pt requesting callback.Marland KitchenMarland Kitchen

## 2021-11-09 NOTE — Telephone Encounter (Signed)
Medication sent to pharmacy, I called and informed the patients wife of this and she understood. Shermar Friedland,cma

## 2021-12-03 ENCOUNTER — Other Ambulatory Visit: Payer: Self-pay | Admitting: Family Medicine

## 2022-01-01 DIAGNOSIS — I442 Atrioventricular block, complete: Secondary | ICD-10-CM | POA: Diagnosis not present

## 2022-01-01 DIAGNOSIS — Z950 Presence of cardiac pacemaker: Secondary | ICD-10-CM | POA: Diagnosis not present

## 2022-01-01 DIAGNOSIS — Z45018 Encounter for adjustment and management of other part of cardiac pacemaker: Secondary | ICD-10-CM | POA: Diagnosis not present

## 2022-01-04 ENCOUNTER — Other Ambulatory Visit: Payer: Self-pay | Admitting: Family Medicine

## 2022-01-04 DIAGNOSIS — L309 Dermatitis, unspecified: Secondary | ICD-10-CM

## 2022-01-04 DIAGNOSIS — Z23 Encounter for immunization: Secondary | ICD-10-CM | POA: Diagnosis not present

## 2022-01-08 NOTE — Telephone Encounter (Signed)
Noted. He really should not be applying these to his face with any regularity. He absolutely needs to avoid getting them in his eyes.

## 2022-01-14 DIAGNOSIS — Z23 Encounter for immunization: Secondary | ICD-10-CM | POA: Diagnosis not present

## 2022-01-18 ENCOUNTER — Ambulatory Visit: Payer: Medicare Other | Admitting: Family Medicine

## 2022-01-20 ENCOUNTER — Other Ambulatory Visit: Payer: Self-pay | Admitting: Family Medicine

## 2022-01-20 DIAGNOSIS — M109 Gout, unspecified: Secondary | ICD-10-CM

## 2022-01-22 ENCOUNTER — Ambulatory Visit: Payer: Medicare Other | Admitting: Family Medicine

## 2022-02-08 ENCOUNTER — Ambulatory Visit (INDEPENDENT_AMBULATORY_CARE_PROVIDER_SITE_OTHER): Payer: Medicare Other | Admitting: Family Medicine

## 2022-02-08 ENCOUNTER — Encounter: Payer: Self-pay | Admitting: Family Medicine

## 2022-02-08 VITALS — BP 121/80 | HR 86 | Temp 97.7°F | Ht 73.0 in | Wt 211.6 lb

## 2022-02-08 DIAGNOSIS — Z951 Presence of aortocoronary bypass graft: Secondary | ICD-10-CM | POA: Insufficient documentation

## 2022-02-08 DIAGNOSIS — I10 Essential (primary) hypertension: Secondary | ICD-10-CM

## 2022-02-08 DIAGNOSIS — R7303 Prediabetes: Secondary | ICD-10-CM | POA: Insufficient documentation

## 2022-02-08 DIAGNOSIS — I480 Paroxysmal atrial fibrillation: Secondary | ICD-10-CM

## 2022-02-08 DIAGNOSIS — Z95820 Peripheral vascular angioplasty status with implants and grafts: Secondary | ICD-10-CM | POA: Insufficient documentation

## 2022-02-08 DIAGNOSIS — Z950 Presence of cardiac pacemaker: Secondary | ICD-10-CM | POA: Insufficient documentation

## 2022-02-08 LAB — POCT GLYCOSYLATED HEMOGLOBIN (HGB A1C): Hemoglobin A1C: 5.8 % — AB (ref 4.0–5.6)

## 2022-02-08 NOTE — Assessment & Plan Note (Signed)
Check A1c.  He will remain active and monitor his diet.

## 2022-02-08 NOTE — Progress Notes (Signed)
Tommi Rumps, MD Phone: [phone]  Blake Osborne is a 81 y.o. male who presents today for f/u.  HYPERTENSION Disease Monitoring Home BP Monitoring not checking Chest pain- no    Dyspnea- no Medications Compliance-  taking HCTZ, imdur, metoprolol.  Edema- right ankle though this is chronic and unchanged BMET    Component Value Date/Time   NA 138 07/18/2021 1158   NA 139 07/25/2010 0800   K 4.2 07/18/2021 1158   CL 104 07/18/2021 1158   CO2 29 07/18/2021 1158   GLUCOSE 106 (H) 07/18/2021 1158   BUN 25 (H) 07/18/2021 1158   BUN 23 (A) 07/25/2010 0800   CREATININE 1.28 07/18/2021 1158   CALCIUM 9.5 07/18/2021 1158   GFRNONAA 44 (L) 10/01/2019 1155   GFRAA 51 (L) 10/01/2019 1155   Atrial fibrillation: Patient taking Eliquis.Metoprolol.  No palpitations or bleeding issues.  He does have some bruising if he bumps into things.  Prediabetes: His wife reports his diet not healthy though he notes he typically has cereal for breakfast.  Of a salad for lunch.  He will have a potato and a small piece of meat for dinner.  He is active around the house.  Social History   Tobacco Use  Smoking Status Former   Years: 8.00   Types: Cigarettes   Quit date: 10/01/1959   Years since quitting: 62.4  Smokeless Tobacco Never    Current Outpatient Medications on File Prior to Visit  Medication Sig Dispense Refill   allopurinol (ZYLOPRIM) 100 MG tablet TAKE 1/2 TABLET(50 MG) BY MOUTH DAILY 45 tablet 1   cetirizine (ZYRTEC) 10 MG tablet Take 10 mg by mouth daily as needed.      ELIQUIS 5 MG TABS tablet TAKE 1 TABLET(5 MG) BY MOUTH TWICE DAILY 180 tablet 1   fluocinonide-emollient (LIDEX-E) 0.05 % cream APPLY TOPICALLY TO THE AFFECTED AREA DAILY AS NEEDED 60 g 0   FLUoxetine (PROZAC) 40 MG capsule TAKE 1 CAPSULE(40 MG) BY MOUTH DAILY 90 capsule 0   gabapentin (NEURONTIN) 100 MG capsule TAKE 2 CAPSULES(200 MG) BY MOUTH TWICE DAILY 360 capsule 2   hydrochlorothiazide (MICROZIDE)  12.5 MG capsule Take 1 capsule (12.5 mg total) by mouth daily. 90 capsule 2   isosorbide mononitrate (IMDUR) 30 MG 24 hr tablet TAKE 1 TABLET BY MOUTH EVERY DAY 90 tablet 2   levETIRAcetam (KEPPRA XR) 500 MG 24 hr tablet Take 1,000-1,500 mg by mouth 2 (two) times daily. Take 2 tablets by mouth in the am and 3 tablets by mouth in the pm     metoprolol succinate (TOPROL-XL) 25 MG 24 hr tablet TAKE 1 TABLET(25 MG) BY MOUTH DAILY 90 tablet 3   rosuvastatin (CRESTOR) 40 MG tablet TAKE 1 TABLET(40 MG) BY MOUTH DAILY 90 tablet 1   triamcinolone cream (KENALOG) 0.5 % APPLY TOPICALLY TO THE AFFECTED AREA DAILY AS NEEDED FOR RASH 30 g 0   No current facility-administered medications on file prior to visit.     ROS see history of present illness  Objective  Physical Exam Vitals:   02/08/22 0936  BP: 121/80  Pulse: 86  Temp: 97.7 F (36.5 C)  SpO2: 96%    BP Readings from Last 3 Encounters:  02/08/22 121/80  07/18/21 118/78  06/27/20 110/70   Wt Readings from Last 3 Encounters:  02/08/22 211 lb 9.6 oz (96 kg)  07/31/21 212 lb (96.2 kg)  07/18/21 212 lb (96.2 kg)    Physical Exam Constitutional:  General: He is not in acute distress.    Appearance: He is not diaphoretic.  Cardiovascular:     Rate and Rhythm: Normal rate and regular rhythm.     Heart sounds: Normal heart sounds.  Pulmonary:     Effort: Pulmonary effort is normal.     Breath sounds: Normal breath sounds.  Musculoskeletal:     Comments: Trace pitting edema right lower extremity at the ankle, no edema left lower extremity  Skin:    General: Skin is warm and dry.  Neurological:     Mental Status: He is alert.      Assessment/Plan: Please see individual problem list.  Problem List Items Addressed This Visit     Hypertension - Primary (Chronic)    Adequately controlled for his age.  He will continue metoprolol 25 mg daily, Imdur 30 mg daily, and HCTZ 12.5 mg daily.      Paroxysmal atrial fibrillation  (HCC) (Chronic)    Continue Eliquis 5 mg twice daily and metoprolol 25 mg daily.  Advised if he has any prolonged bleeding episodes greater than 10 minutes he needs to seek medical attention.  Encouraged him not to bump into things.      Prediabetes    Check A1c.  He will remain active and monitor his diet.      Relevant Orders   POCT HgB A1C     Health Maintenance: Reports he is up-to-date on COVID vaccination, flu vaccination, and Shingrix.  Return in about 6 months (around 08/10/2022).   [name], MD [facility]

## 2022-02-08 NOTE — Assessment & Plan Note (Signed)
Continue Eliquis 5 mg twice daily and metoprolol 25 mg daily.  Advised if he has any prolonged bleeding episodes greater than 10 minutes he needs to seek medical attention.  Encouraged him not to bump into things.

## 2022-02-08 NOTE — Patient Instructions (Signed)
Nice to see you!   

## 2022-02-08 NOTE — Assessment & Plan Note (Signed)
Adequately controlled for his age.  He will continue metoprolol 25 mg daily, Imdur 30 mg daily, and HCTZ 12.5 mg daily.

## 2022-02-13 DIAGNOSIS — Z9189 Other specified personal risk factors, not elsewhere classified: Secondary | ICD-10-CM | POA: Diagnosis not present

## 2022-02-13 DIAGNOSIS — I6930 Unspecified sequelae of cerebral infarction: Secondary | ICD-10-CM | POA: Diagnosis not present

## 2022-02-13 DIAGNOSIS — G40109 Localization-related (focal) (partial) symptomatic epilepsy and epileptic syndromes with simple partial seizures, not intractable, without status epilepticus: Secondary | ICD-10-CM | POA: Diagnosis not present

## 2022-02-13 DIAGNOSIS — G3184 Mild cognitive impairment, so stated: Secondary | ICD-10-CM | POA: Diagnosis not present

## 2022-02-20 ENCOUNTER — Other Ambulatory Visit: Payer: Self-pay

## 2022-02-20 DIAGNOSIS — Z7901 Long term (current) use of anticoagulants: Secondary | ICD-10-CM

## 2022-02-20 MED ORDER — APIXABAN 5 MG PO TABS: ORAL_TABLET | ORAL | 1 refills | Status: DC

## 2022-02-28 ENCOUNTER — Other Ambulatory Visit: Payer: Self-pay | Admitting: Family

## 2022-03-22 IMAGING — US US ABDOMEN COMPLETE
1 series · 14 of 25 positions shown · non-contrast
Comparison: None.

CLINICAL DATA: Thrombocytopenia

EXAM:
ABDOMEN ULTRASOUND COMPLETE

[Series 1: us abdomen complete · 0.25mm/px · 14 of 96 slices shown]
[im 1/96]
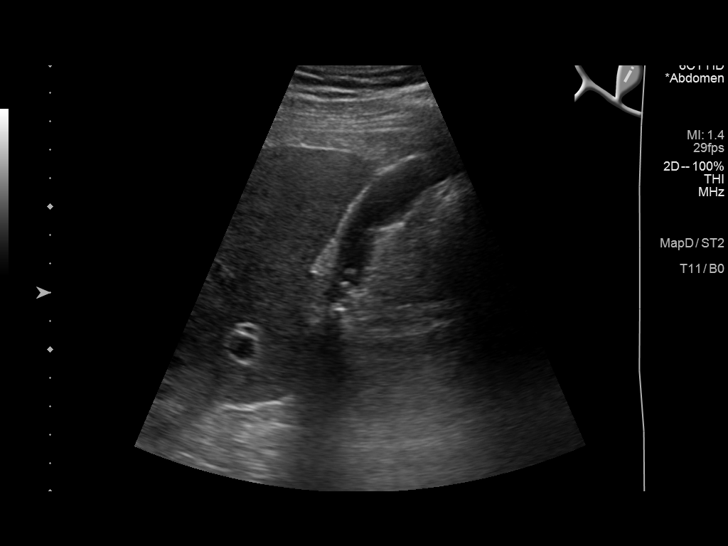
[im 8/96]
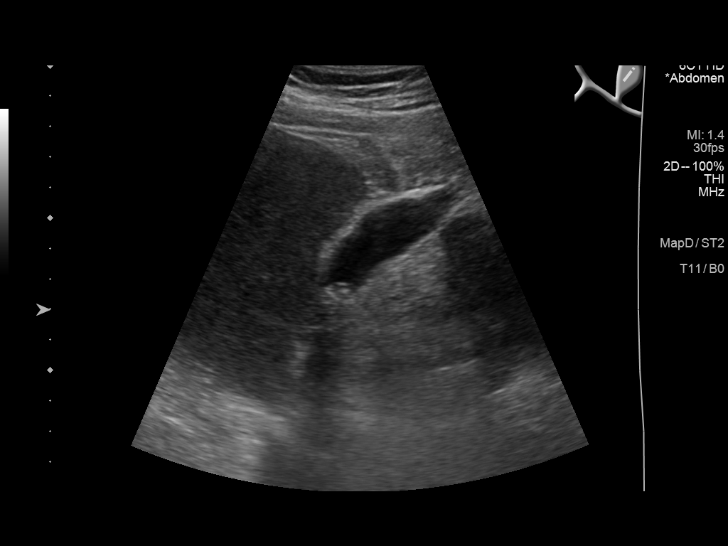
[im 16/96]
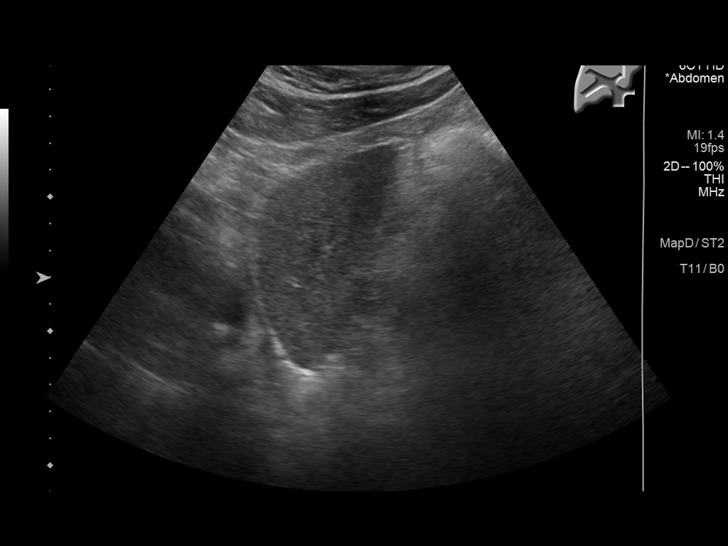
[im 24/96]
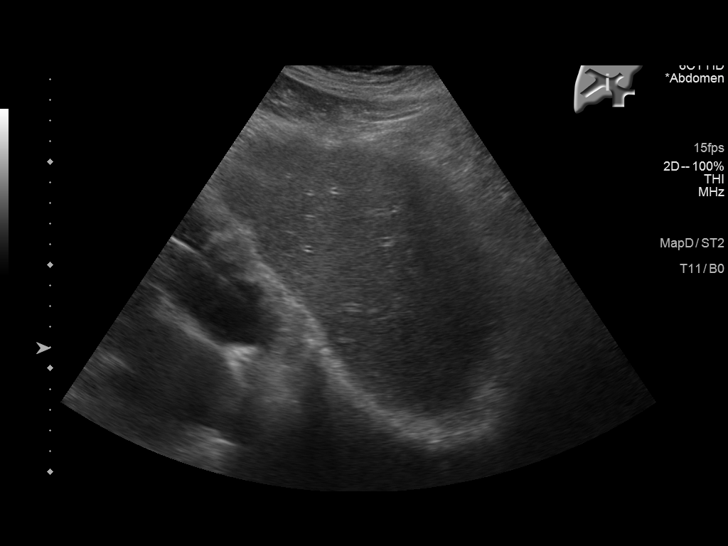
[im 32/96]
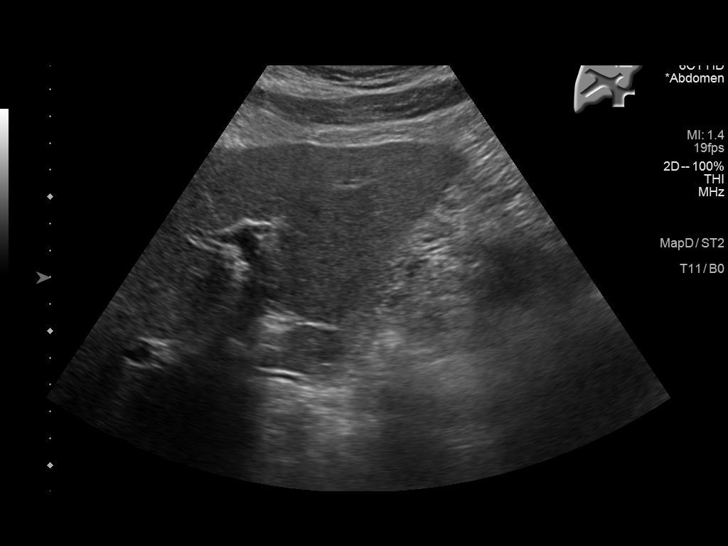
[im 36/96]
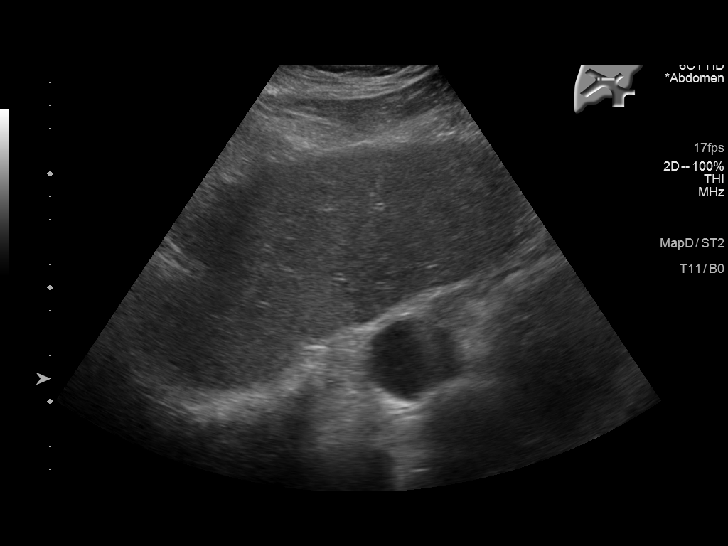
[im 44/96]
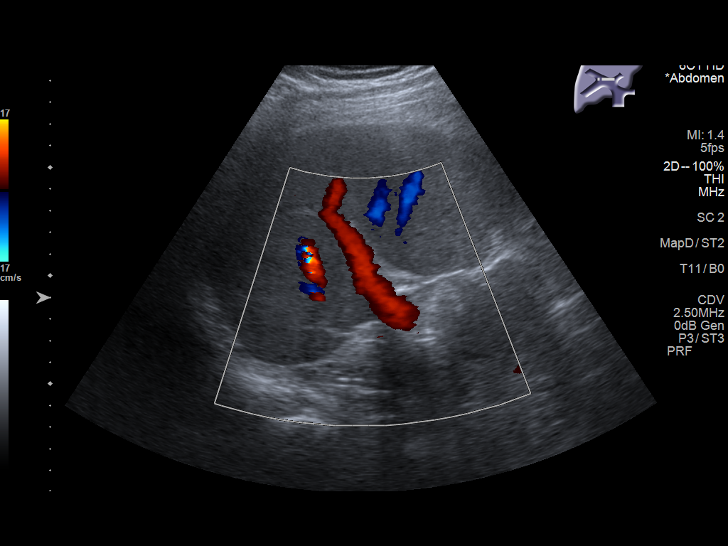
[im 52/96]
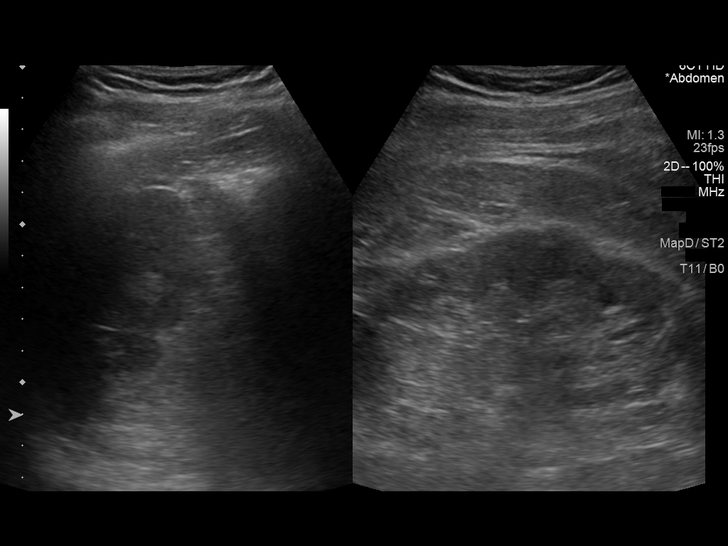
[im 60/96]
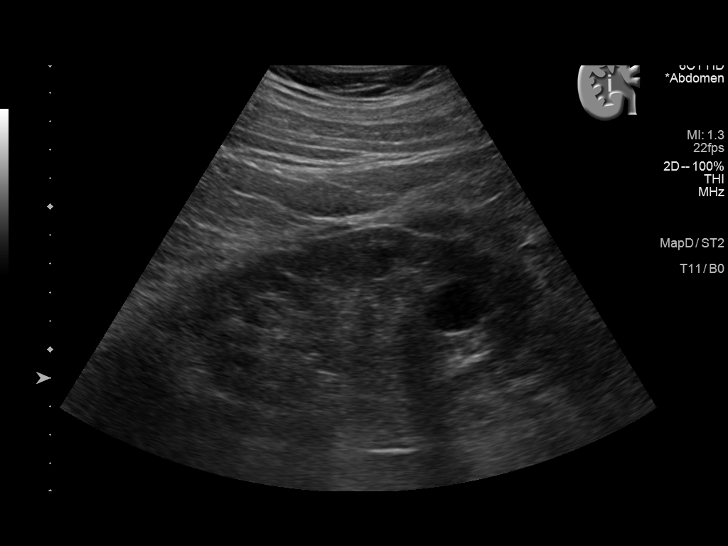
[im 64/96]
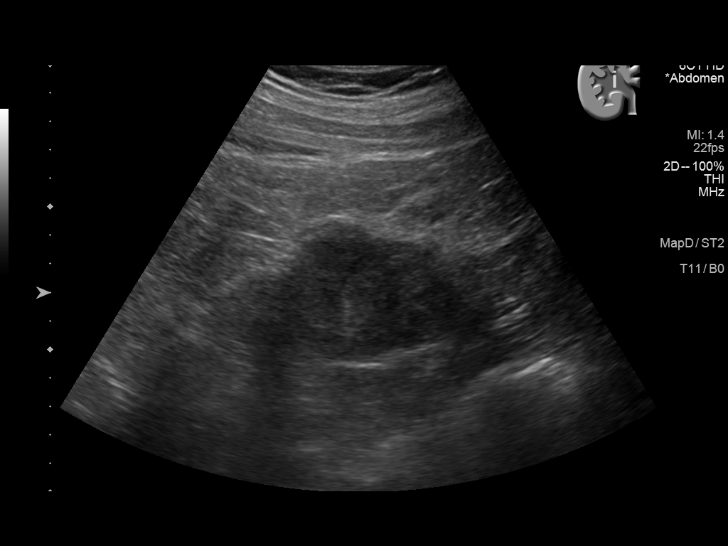
[im 72/96]
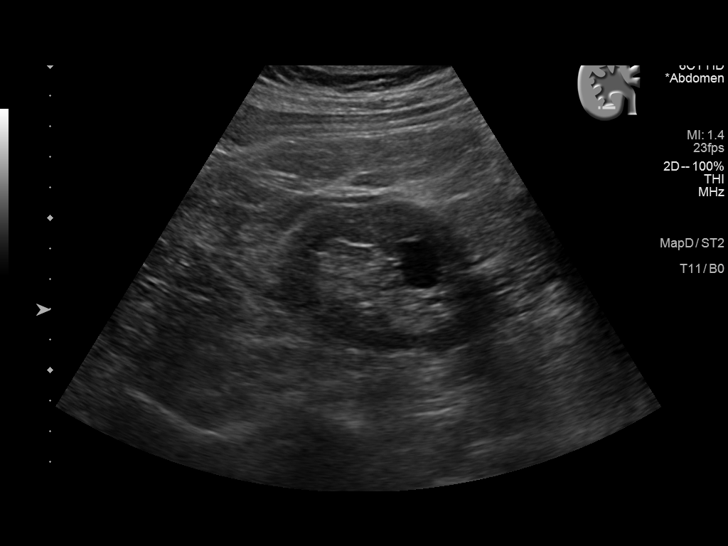
[im 80/96]
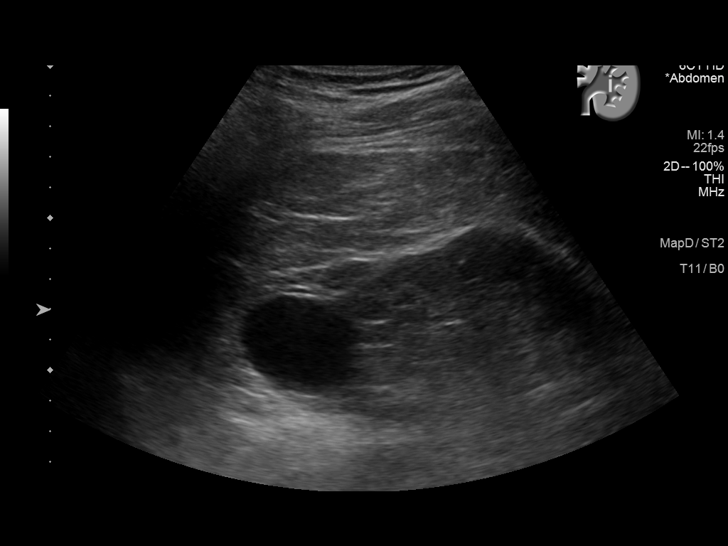
[im 88/96]
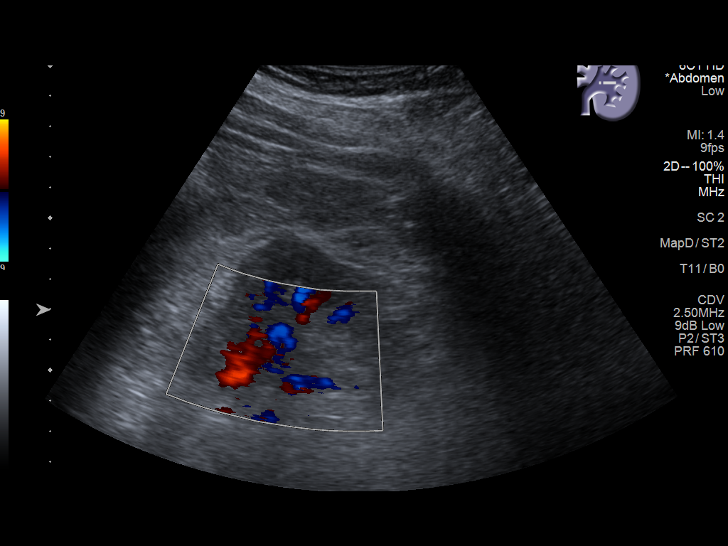
[im 96/96]
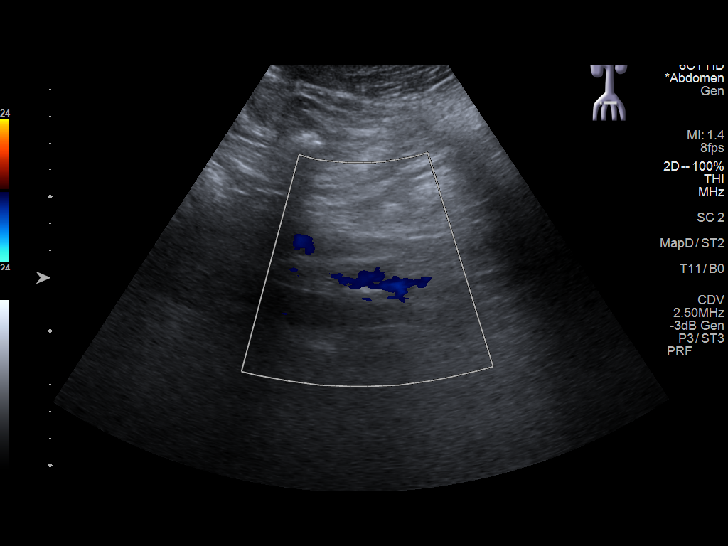

[14 of 25 positions shown; findings below may reference images not displayed]

FINDINGS: Gallbladder: Well distended with multiple stones within. No wall
thickening or pericholecystic fluid is noted.

Common bile duct: Diameter: 3.3 mm.

Liver: No focal lesion identified. Within normal limits in
parenchymal echogenicity. Portal vein is patent on color Doppler
imaging with normal direction of blood flow towards the liver.

IVC: No abnormality visualized.

Pancreas: Visualized portion unremarkable.

Spleen: Size and appearance within normal limits.

Right Kidney: Length: 12.7 cm. Multiple cysts are noted. The largest
of these lies in the lower pole measuring 5.2 cm. This is simple in
nature. The smaller cyst also demonstrate is simple in appearance.

Left Kidney: Length: 12.2 cm. No mass lesion or hydronephrosis is
noted. Simple cyst is noted in the upper pole measuring 3.6 cm.

Abdominal aorta: No aneurysm visualized.

Other findings: None.
IMPRESSION: Bilateral simple renal cysts

Cholelithiasis without complicating factors.

## 2022-04-03 ENCOUNTER — Other Ambulatory Visit: Payer: Self-pay | Admitting: Family Medicine

## 2022-04-03 DIAGNOSIS — I10 Essential (primary) hypertension: Secondary | ICD-10-CM

## 2022-04-03 DIAGNOSIS — Z45018 Encounter for adjustment and management of other part of cardiac pacemaker: Secondary | ICD-10-CM | POA: Diagnosis not present

## 2022-04-03 DIAGNOSIS — I442 Atrioventricular block, complete: Secondary | ICD-10-CM | POA: Diagnosis not present

## 2022-04-03 DIAGNOSIS — Z950 Presence of cardiac pacemaker: Secondary | ICD-10-CM | POA: Diagnosis not present

## 2022-04-07 ENCOUNTER — Ambulatory Visit (INDEPENDENT_AMBULATORY_CARE_PROVIDER_SITE_OTHER): Payer: Medicare Other

## 2022-04-07 ENCOUNTER — Ambulatory Visit
Admission: EM | Admit: 2022-04-07 | Discharge: 2022-04-07 | Disposition: A | Discharge status: Home / Self Care | Payer: Medicare Other | Attending: Physician Assistant | Admitting: Physician Assistant

## 2022-04-07 DIAGNOSIS — Z1152 Encounter for screening for COVID-19: Secondary | ICD-10-CM | POA: Insufficient documentation

## 2022-04-07 DIAGNOSIS — R051 Acute cough: Secondary | ICD-10-CM

## 2022-04-07 DIAGNOSIS — R112 Nausea with vomiting, unspecified: Secondary | ICD-10-CM | POA: Insufficient documentation

## 2022-04-07 DIAGNOSIS — J101 Influenza due to other identified influenza virus with other respiratory manifestations: Secondary | ICD-10-CM | POA: Insufficient documentation

## 2022-04-07 DIAGNOSIS — R5383 Other fatigue: Secondary | ICD-10-CM | POA: Diagnosis not present

## 2022-04-07 DIAGNOSIS — R509 Fever, unspecified: Secondary | ICD-10-CM | POA: Diagnosis not present

## 2022-04-07 DIAGNOSIS — R059 Cough, unspecified: Secondary | ICD-10-CM | POA: Diagnosis not present

## 2022-04-07 LAB — SARS CORONAVIRUS 2 BY RT PCR: SARS Coronavirus 2 by RT PCR: NEGATIVE

## 2022-04-07 LAB — RAPID INFLUENZA A&B ANTIGENS
Influenza A (ARMC): POSITIVE — AB
Influenza B (ARMC): NEGATIVE

## 2022-04-07 MED ORDER — OSELTAMIVIR PHOSPHATE 75 MG PO CAPS: 75.0000 mg | ORAL_CAPSULE | Freq: Two times a day (BID) | ORAL | 0 refills | Status: AC

## 2022-04-07 MED ORDER — ONDANSETRON 4 MG PO TBDP
4.0000 mg | ORAL_TABLET | Freq: Once | ORAL | Status: AC
  Administered 2022-04-07: 4 mg via ORAL

## 2022-04-07 MED ORDER — ONDANSETRON 4 MG PO TBDP: 4.0000 mg | ORAL_TABLET | Freq: Four times a day (QID) | ORAL | 0 refills | Status: DC | PRN

## 2022-04-07 MED ORDER — BENZONATATE 200 MG PO CAPS: 200.0000 mg | ORAL_CAPSULE | Freq: Three times a day (TID) | ORAL | 0 refills | Status: DC | PRN

## 2022-04-07 NOTE — ED Provider Notes (Signed)
[facility]    CSN: [id number] Arrival date & time: 04/07/22  [id number]      History   Chief Complaint Chief Complaint  Patient presents with   Cough   Fatigue         HPI Blake Osborne is a 81 y.o. male presenting with his daughter for low-grade fever, fatigue, body aches, headaches, cough, congestion, scratchy throat, nausea and vomiting for the past day.  Daughter states she thought she heard him wheezing.  He has no history of lung disease.  Patient is denying any chest pain or shortness of breath.  They deny any sick contacts and he states that he stays home and does not really go anywhere so he does not know how he got sick.  His medical history significant for atrial fibrillation, cardiac pacemaker, multiple stents and CABG, history of stroke, hypertension and hyperlipidemia.  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   Head injury 05/29/2018   Trochanteric bursitis of left hip 08/20/2017   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   Lightheadedness 06/18/2017   Proteinuria 06/18/2017   Seizure disorder (HCC) 04/26/2017   History of stroke 03/18/2017   Anxiety 03/18/2017   Anticoagulated 01/07/2017   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 Authorizing Provider  allopurinol (ZYLOPRIM) 100 MG tablet TAKE 1/2 TABLET(50 MG) BY MOUTH DAILY 01/21/22  Yes Glori LuisSonnenberg, Eric G, MD  apixaban (ELIQUIS) 5 MG TABS tablet TAKE 1 TABLET(5 MG) BY MOUTH TWICE DAILY 02/20/22  Yes Glori LuisSonnenberg, Eric G, MD  benzonatate (TESSALON) 200 MG capsule Take 1 capsule (200 mg total) by mouth 3 (three) times daily as needed for cough. 04/07/22  Yes Shirlee LatchEaves, Alireza Pollack B, PA-C  cetirizine (ZYRTEC) 10 MG tablet Take 10 mg by mouth daily as needed.    Yes [provider]  FLUoxetine (PROZAC) 40 MG capsule TAKE 1 CAPSULE(40 MG) BY MOUTH DAILY 12/03/21  Yes Worthy RancherWebb, Padonda B, FNP  gabapentin (NEURONTIN) 100 MG capsule TAKE 2 CAPSULES(200 MG) BY MOUTH TWICE DAILY 09/07/21  Yes Glori LuisSonnenberg, Eric G, MD  hydrochlorothiazide (MICROZIDE) 12.5 MG capsule TAKE 1 CAPSULE(12.5 MG) BY MOUTH DAILY 04/03/22  Yes Glori LuisSonnenberg, Eric G, MD  isosorbide mononitrate (IMDUR) 30 MG 24 hr tablet TAKE 1 TABLET BY MOUTH EVERY DAY 10/15/21  Yes [name], MD  metoprolol succinate (TOPROL-XL) 25 MG 24 hr tablet TAKE 1 TABLET(25 MG) BY MOUTH DAILY 11/09/21  Yes [name], MD  ondansetron (ZOFRAN-ODT) 4 MG disintegrating tablet Take 1 tablet (4 mg total) by mouth every 6 (six) hours as needed for nausea or vomiting. 04/07/22  Yes Shirlee Latch, PA-C  oseltamivir (TAMIFLU) 75 MG capsule Take 1 capsule (75 mg total) by mouth every 12 (twelve) hours for 5 days. 04/07/22 04/12/22 Yes Eusebio Friendly B, PA-C  rosuvastatin (CRESTOR) 40 MG tablet TAKE 1 TABLET(40 MG) BY MOUTH DAILY 01/21/22  Yes  [name], MD  triamcinolone cream (KENALOG) 0.5 % APPLY TOPICALLY TO THE AFFECTED AREA DAILY AS NEEDED FOR RASH 01/08/22  Yes [name], MD  fluocinonide-emollient (LIDEX-E) 0.05 % cream APPLY TOPICALLY TO THE AFFECTED AREA DAILY AS NEEDED 01/08/22   [name], MD  levETIRAcetam (KEPPRA XR) 500 MG 24 hr tablet Take 1,000-1,500 mg by mouth 2 (two) times daily. Take 2 tablets by mouth in the am and 3 tablets by mouth in the pm    [provider]    Family History Family History  Problem Relation Age of Onset   Heart disease Father    Heart attack Father    Aneurysm Brother        heart    Social History Social History   Tobacco Use   Smoking status: Former    Years: 8.00    Types: Cigarettes    Quit date: 10/01/1959    Years since quitting: 62.5   Smokeless tobacco: Never  Vaping Use   Vaping Use: Never used  Substance Use Topics   Alcohol use: Yes    Alcohol/week: 1.0 standard drink of alcohol    Types: 1 Glasses of wine per week    Comment: occasional glass of wine   Drug use: No     Allergies   Augmentin [amoxicillin-pot clavulanate], Dust mite extract, Other, and Pollen extract   Review of Systems Review of Systems  Constitutional:  Positive for appetite change, fatigue and fever.  HENT:  Positive for congestion, rhinorrhea and sore throat. Negative for sinus pressure and sinus pain.   Respiratory:  Positive for cough. Negative for shortness of breath.   Cardiovascular:  Negative for chest pain.  Gastrointestinal:  Positive for nausea and vomiting. Negative for abdominal pain and diarrhea.  Musculoskeletal:  Positive for myalgias.  Neurological:  Positive for headaches. Negative for weakness and light-headedness.  Hematological:  Negative for adenopathy.     Physical Exam Triage Vital Signs ED Triage Vitals  Enc Vitals Group     BP 04/07/22 0901 122/73     Pulse Rate 04/07/22 0901 74     Resp 04/07/22 0901 18     Temp  04/07/22 0901 100 F (37.8 C)     Temp Source 04/07/22 0901 Oral     SpO2 04/07/22 0901 93 %     Weight 04/07/22 0858 215 lb 4.8 oz (97.7 kg)     Height 04/07/22 0858 6\' 1"  (1.854 m)     Head Circumference --      Peak Flow --      Pain Score 04/07/22 0857 5     Pain Loc --      Pain Edu? --      Excl. in GC? --    No data found.  Updated Vital Signs BP 122/73 (BP Location: Left Arm)   Pulse 74  Temp 100 F (37.8 C) (Oral)   Resp 18   Ht 6\' 1"  (1.854 m)   Wt 215 lb 4.8 oz (97.7 kg)   SpO2 93%   BMI 28.41 kg/m       Physical Exam Vitals and nursing note reviewed.  Constitutional:      General: He is not in acute distress.    Appearance: Normal appearance. He is well-developed. He is not ill-appearing.  HENT:     Head: Normocephalic and atraumatic.     Nose: Congestion present.     Mouth/Throat:     Mouth: Mucous membranes are moist.     Pharynx: Oropharynx is clear. Posterior oropharyngeal erythema present.  Eyes:     General: No scleral icterus.    Conjunctiva/sclera: Conjunctivae normal.  Cardiovascular:     Rate and Rhythm: Normal rate and regular rhythm.  Pulmonary:     Effort: Pulmonary effort is normal. No respiratory distress.     Breath sounds: Normal breath sounds. No wheezing, rhonchi or rales.  Abdominal:     Palpations: Abdomen is soft.     Tenderness: There is no abdominal tenderness.  Musculoskeletal:     Cervical back: Neck supple.  Skin:    General: Skin is warm and dry.     Capillary Refill: Capillary refill takes less than 2 seconds.  Neurological:     General: No focal deficit present.     Mental Status: He is alert. Mental status is at baseline.     Motor: No weakness.     Gait: Gait normal.  Psychiatric:        Mood and Affect: Mood normal.        Behavior: Behavior normal.      UC Treatments / Results  Labs (all labs ordered are listed, but only abnormal results are displayed) Labs Reviewed  RAPID INFLUENZA A&B ANTIGENS -  Abnormal; Notable for the following components:      Result Value   Influenza A ([facility]) POSITIVE (*)    All other components within normal limits  SARS CORONAVIRUS 2 BY RT PCR    EKG   Radiology DG Chest 2 View  Result Date: 04/07/2022 CLINICAL DATA:  cough, congestion and fever since yesterday EXAM: CHEST - 2 VIEW COMPARISON:  None Available. FINDINGS: The cardiomediastinal silhouette is enlarged in contour.Status post median sternotomy and CABG. LEFT chest cardiac pacing device. No pleural effusion. No pneumothorax. No acute pleuroparenchymal abnormality. Visualized abdomen is unremarkable. Multilevel degenerative changes of the thoracic spine. IMPRESSION: No acute cardiopulmonary abnormality. Electronically Signed   By: 04/09/2022 M.D.   On: 04/07/2022 09:31    Procedures Procedures (including critical care time)  Medications Ordered in UC Medications  ondansetron (ZOFRAN-ODT) disintegrating tablet 4 mg (has no administration in time range)    Initial Impression / Assessment and Plan / UC Course  I have reviewed the triage vital signs and the nursing notes.  Pertinent labs & imaging results that were available during my care of the patient were reviewed by me and considered in my medical decision making (see chart for details).   81 year old male presents with his daughter for fever, fatigue, cough, congestion, scratchy throat, headaches and bodyaches since yesterday.  Temp currently 100 degrees.  He is in no distress and is overall well-appearing.  On exam he has nasal congestion and minimal erythema posterior pharynx.  Chest clear to auscultation heart regular rate and rhythm.  Rapid flu testing, PCR COVID test and chest x-ray obtained.  Negative COVID.  Positive influenza A.  Normal chest x-ray.  Discussed all results with patient and his daughter.  Patient given 4 mg ODT Zofran in clinic.  Discussed supportive care and ED precautions regarding the flu.  Sent  Tamiflu, benzonatate and Zofran ODT to pharmacy.  Advised plenty of rest and fluids.  Reviewed return and ER precautions.   Final Clinical Impressions(s) / UC Diagnoses   Final diagnoses:  Influenza A  Acute cough  Other fatigue  Nausea and vomiting, unspecified vomiting type     Discharge Instructions      -Positive flu test.  I sent Tamiflu, cough medicine and Zofran to the pharmacy.  Plenty rest and fluids.  Tylenol as needed for fever control. - Should be feeling better over the next week. - You need to be seen again if you have any uncontrolled fever, weakness or breathing trouble.     ED Prescriptions     Medication Sig Dispense Auth. Provider   oseltamivir (TAMIFLU) 75 MG capsule Take 1 capsule (75 mg total) by mouth every 12 (twelve) hours for 5 days. 10 capsule Eusebio Friendly B, PA-C   benzonatate (TESSALON) 200 MG capsule Take 1 capsule (200 mg total) by mouth 3 (three) times daily as needed for cough. 20 capsule Eusebio Friendly B, PA-C   ondansetron (ZOFRAN-ODT) 4 MG disintegrating tablet Take 1 tablet (4 mg total) by mouth every 6 (six) hours as needed for nausea or vomiting. 15 tablet Gareth Morgan      PDMP not reviewed this encounter.   Shirlee Latch, PA-C 04/07/22 [phone]

## 2022-04-07 NOTE — ED Triage Notes (Signed)
Pt is with his daughter  Pt c/o cough, fatigue, vomiting, nausea x1days

## 2022-04-07 NOTE — Discharge Instructions (Addendum)
-  Positive flu test.  I sent Tamiflu, cough medicine and Zofran to the pharmacy.  Plenty rest and fluids.  Tylenol as needed for fever control. - Should be feeling better over the next week. - You need to be seen again if you have any uncontrolled fever, weakness or breathing trouble.

## 2022-04-19 ENCOUNTER — Other Ambulatory Visit: Payer: Self-pay | Admitting: Family

## 2022-04-29 ENCOUNTER — Telehealth: Payer: Self-pay | Admitting: Family Medicine

## 2022-04-29 MED ORDER — FLUOXETINE HCL 40 MG PO CAPS: ORAL_CAPSULE | ORAL | 3 refills | Status: DC

## 2022-04-29 NOTE — Telephone Encounter (Signed)
Prescription Request  04/29/2022  Is this a "Controlled Substance" medicine? No  LOV: 02/08/2022  What is the name of the medication or equipment? FLUoxetine (PROZAC) 40 MG capsule  Have you contacted your pharmacy to request a refill? Yes   Which pharmacy would you like this sent to?   [id number] [address], [place] AT [facility] OF [address] [place] [address] Phone: [phone] Fax: [phone]    Patient notified that their request is being sent to the clinical staff for review and that they should receive a response within 2 business days.   Please advise at Mobile [phone] (mobile)

## 2022-05-19 ENCOUNTER — Other Ambulatory Visit: Payer: Self-pay | Admitting: Family Medicine

## 2022-07-03 DIAGNOSIS — I442 Atrioventricular block, complete: Secondary | ICD-10-CM | POA: Diagnosis not present

## 2022-07-03 DIAGNOSIS — Z45018 Encounter for adjustment and management of other part of cardiac pacemaker: Secondary | ICD-10-CM | POA: Diagnosis not present

## 2022-07-03 DIAGNOSIS — Z950 Presence of cardiac pacemaker: Secondary | ICD-10-CM | POA: Diagnosis not present

## 2022-07-10 ENCOUNTER — Other Ambulatory Visit: Payer: Self-pay | Admitting: Family Medicine

## 2022-07-17 ENCOUNTER — Other Ambulatory Visit: Payer: Self-pay | Admitting: Family Medicine

## 2022-07-17 DIAGNOSIS — M109 Gout, unspecified: Secondary | ICD-10-CM

## 2022-07-19 ENCOUNTER — Encounter: Payer: Self-pay | Admitting: *Deleted

## 2022-07-19 NOTE — Telephone Encounter (Signed)
Sent message to patient needs appt for further refills.

## 2022-07-31 ENCOUNTER — Telehealth: Payer: Self-pay | Admitting: Family Medicine

## 2022-07-31 NOTE — Telephone Encounter (Signed)
Contacted Blake Osborne to schedule their annual wellness visit. Call back at later date: 08/01/2022  Thank you,  [facility] Support [facility] Direct dial  [phone]

## 2022-08-01 NOTE — Telephone Encounter (Signed)
Contacted Beola Cord to schedule their annual wellness visit. Appointment made for 08/09/2022.  Thank you,  [facility] Support [facility] Direct dial  [phone]

## 2022-08-09 ENCOUNTER — Ambulatory Visit (INDEPENDENT_AMBULATORY_CARE_PROVIDER_SITE_OTHER): Payer: Medicare Other

## 2022-08-09 VITALS — Ht 73.0 in | Wt 215.0 lb

## 2022-08-09 DIAGNOSIS — Z0000 Encounter for general adult medical examination without abnormal findings: Secondary | ICD-10-CM

## 2022-08-09 NOTE — Progress Notes (Signed)
Subjective:   Blake Osborne is a 82 y.o. male who presents for Medicare Annual/Subsequent preventive examination.  Review of Systems    No ROS.  Medicare Wellness Virtual Visit.  Visual/audio telehealth visit, UTA vital signs.   See social history for additional risk factors.   Cardiac Risk Factors include: advanced age (>43men, >75 women);male gender;hypertension     Objective:    Today's Vitals   08/09/22 0954  Weight: 215 lb (97.5 kg)  Height: 6\' 1"  (1.854 m)   Body mass index is 28.37 kg/m.     08/09/2022   10:00 AM 04/07/2022    9:01 AM 07/31/2021    9:50 AM 10/15/2019    9:09 AM 10/01/2019   10:54 AM 09/06/2019    9:12 AM 09/03/2018   10:29 AM  Advanced Directives  Does Patient Have a Medical Advance Directive? Yes No Yes Yes Yes Yes Yes  Type of Estate agent of [place];Living will  Healthcare Power of [place];Living will Living will;Healthcare Power of Attorney Living will;Healthcare Power of [employer] Power of [place];Living will Healthcare Power of [place];Living will  Does patient want to make changes to medical advance directive? No - Patient declined  No - Patient declined    No - Patient declined  Copy of Healthcare Power of Attorney in Chart? No - copy requested  No - copy requested   No - copy requested No - copy requested    Current Medications (verified) Outpatient Encounter Medications as of 08/09/2022  Medication Sig   allopurinol (ZYLOPRIM) 100 MG tablet TAKE 1/2 TABLET(50 MG) BY MOUTH DAILY   apixaban (ELIQUIS) 5 MG TABS tablet TAKE 1 TABLET(5 MG) BY MOUTH TWICE DAILY   benzonatate (TESSALON) 200 MG capsule Take 1 capsule (200 mg total) by mouth 3 (three) times daily as needed for cough.   cetirizine (ZYRTEC) 10 MG tablet Take 10 mg by mouth daily as needed.    fluocinonide-emollient (LIDEX-E) 0.05 % cream APPLY TOPICALLY TO THE AFFECTED AREA DAILY AS NEEDED   FLUoxetine (PROZAC) 40 MG capsule TAKE 1  CAPSULE(40 MG) BY MOUTH DAILY   gabapentin (NEURONTIN) 100 MG capsule TAKE 2 CAPSULES(200 MG) BY MOUTH TWICE DAILY   hydrochlorothiazide (MICROZIDE) 12.5 MG capsule TAKE 1 CAPSULE(12.5 MG) BY MOUTH DAILY   isosorbide mononitrate (IMDUR) 30 MG 24 hr tablet TAKE 1 TABLET BY MOUTH EVERY DAY   levETIRAcetam (KEPPRA XR) 500 MG 24 hr tablet Take 1,000-1,500 mg by mouth 2 (two) times daily. Take 2 tablets by mouth in the am and 3 tablets by mouth in the pm   metoprolol succinate (TOPROL-XL) 25 MG 24 hr tablet TAKE 1 TABLET(25 MG) BY MOUTH DAILY   ondansetron (ZOFRAN-ODT) 4 MG disintegrating tablet Take 1 tablet (4 mg total) by mouth every 6 (six) hours as needed for nausea or vomiting.   rosuvastatin (CRESTOR) 40 MG tablet TAKE 1 TABLET(40 MG) BY MOUTH DAILY   triamcinolone cream (KENALOG) 0.5 % APPLY TOPICALLY TO THE AFFECTED AREA DAILY AS NEEDED FOR RASH   No facility-administered encounter medications on file as of 08/09/2022.    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    s/p [facility]vessel [facility]G [facility] 2011   Hyperlipidemia    Hypertension    Left BB block NEC    Pacemaker    Thrombocytopenia (  HCC)    Past Surgical History:  Procedure Laterality Date   CARDIAC SURGERY     CORONARY ARTERY BYPASS GRAFT     PACEMAKER GENERATOR CHANGE     Family History  Problem Relation Age of Onset   Heart disease Father    Heart attack Father    Aneurysm Brother        heart   Social History   Socioeconomic History   Marital status: Married    Spouse name: Not on file   Number of children: Not on file   Years of education: Not on file   Highest education level: Not on file  Occupational History   Not on file  Tobacco Use   Smoking status: Former    Years: 8    Types: Cigarettes    Quit  date: 10/01/1959    Years since quitting: 62.8   Smokeless tobacco: Never  Vaping Use   Vaping Use: Never used  Substance and Sexual Activity   Alcohol use: Yes    Alcohol/week: 1.0 standard drink of alcohol    Types: 1 Glasses of wine per week    Comment: occasional glass of wine   Drug use: No   Sexual activity: Not on file  Other Topics Concern   Not on file  Social History Narrative   Not on file   Social Determinants of Health   Financial Resource Strain: Low Risk  (08/05/2022)   Overall Financial Resource Strain (CARDIA)    Difficulty of Paying Living Expenses: Not hard at all  Food Insecurity: No Food Insecurity (08/05/2022)   Hunger Vital Sign    Worried About Running Out of Food in the Last Year: Never true    Ran Out of Food in the Last Year: Never true  Transportation Needs: Unknown (08/05/2022)   PRAPARE - Administrator, Civil Service (Medical): Not on file    Lack of Transportation (Non-Medical): No  Physical Activity: Insufficiently Active (08/05/2022)   Exercise Vital Sign    Days of Exercise per Week: 2 days    Minutes of Exercise per Session: 10 min  Stress: No Stress Concern Present (08/05/2022)   [employer] of Occupational Health - Occupational Stress Questionnaire    Feeling of Stress : Not at all  Social Connections: Unknown (08/05/2022)   Social Connection and Isolation Panel [NHANES]    Frequency of Communication with Friends and Family: Never    Frequency of Social Gatherings with Friends and Family: Three times a week    Attends Religious Services: Not on file    Active Member of Clubs or Organizations: No    Attends Banker Meetings: Never    Marital Status: Married    Tobacco Counseling Counseling given: Not Answered   Clinical Intake:  Pre-visit preparation completed: Yes           How often do you need to have someone help you when you read instructions, pamphlets, or other written materials from your  doctor or pharmacy?: 1 - Never    Interpreter Needed?: No      Activities of Daily Living    08/05/2022    6:20 PM  In your present state of health, do you have any difficulty performing the following activities:  Hearing? 1  Comment Hearing aids  Vision? 0  Difficulty concentrating or making decisions? 0  Walking or climbing stairs? 0  Dressing or bathing? 0  Doing errands, shopping? 0  Preparing Food and eating ?  N  Using the Toilet? N  In the past six months, have you accidently leaked urine? N  Do you have problems with loss of bowel control? N  Managing your Medications? N  Managing your Finances? N  Housekeeping or managing your Housekeeping? N    Patient Care Team: [name], MD as PCP - General (Family Medicine)  Indicate any recent Medical Services you may have received from other than [facility] providers in the past year (date may be approximate).     Assessment:   This is a routine wellness examination for [place].  I connected with  Beola Cord on 08/09/22 by a audio enabled telemedicine application and verified that I am speaking with the correct person using two identifiers.  Patient Location: Home  Provider Location: Office/Clinic  I discussed the limitations of evaluation and management by telemedicine. The patient expressed understanding and agreed to proceed.   Hearing/Vision screen Hearing Screening - Comments:: Followed by [facility]its every 2 years Hearing aid, bilateral   Vision Screening - Comments:: Followed by [facility] Wears corrective lenses Cataract extraction, bilateral They have seen their ophthalmologist in the last 12 months.    Dietary issues and exercise activities discussed: Current Exercise Habits: Home exercise routine, Intensity: Mild Healthy diet Fair water intake   Goals Addressed             This Visit's Progress    Increase water intake       Stay hydrated       Depression Screen     08/09/2022    9:59 AM 02/08/2022    9:37 AM 07/31/2021    9:41 AM 07/18/2021   11:40 AM 06/27/2020   11:03 AM 12/27/2019    9:26 AM 12/27/2019    9:25 AM  PHQ 2/9 Scores  PHQ - 2 Score 0 0 0 0 0 0 0    Fall Risk    08/05/2022    6:20 PM 02/08/2022    9:37 AM 07/31/2021   10:02 AM 07/18/2021   11:40 AM 06/27/2020   11:03 AM  Fall Risk   Falls in the past year? 0 0 0 0 0  Number falls in past yr: 0 0  0 0  Injury with Fall? 0 0  0   Risk for fall due to :  No Fall Risks  No Fall Risks   Follow up Falls evaluation completed;Falls prevention discussed Falls evaluation completed Falls evaluation completed Falls evaluation completed Falls evaluation completed    FALL RISK PREVENTION PERTAINING TO THE HOME: Home free of loose throw rugs in walkways, pet beds, electrical cords, etc? Yes  Adequate lighting in your home to reduce risk of falls? Yes   ASSISTIVE DEVICES UTILIZED TO PREVENT FALLS: Life alert? No  Use of a cane, walker or w/c? No  Grab bars in the bathroom? No  Shower chair or bench in shower? No  Comfort chair height toilet? Yes   TIMED UP AND GO: Was the test performed? No .    Cognitive Function:    09/06/2019    9:16 AM 10/07/2016    9:39 AM  MMSE - Mini Mental State Exam  Not completed: Unable to complete   Orientation to time  5  Orientation to Place  5  Registration  3  Attention/ Calculation  5  Recall  3  Language- name 2 objects  2  Language- repeat  1  Language- follow 3 step command  3  Language-  read & follow direction  1  Write a sentence  1  Copy design  1  Total score  30        08/09/2022   10:04 AM 09/03/2018   10:24 AM  6CIT Screen  What Year? 0 points 0 points  What month? 0 points 0 points  What time? 0 points 0 points  Count back from 20  0 points  Months in reverse  0 points  Repeat phrase  0 points  Total Score  0 points    Immunizations Immunization History  Administered Date(s) Administered   COVID-19, mRNA,  vaccine(Comirnaty)12 years and older 01/14/2022   Influenza Split 01/14/2011, 01/16/2012, 02/08/2014   Influenza, High Dose Seasonal PF 01/05/2019   Influenza-Unspecified 03/15/2015, 01/01/2018   PFIZER(Purple Top)SARS-COV-2 Vaccination 05/07/2019, 05/28/2019   Pfizer Covid-19 Vaccine Bivalent Booster 55yrs & up 11/30/2020   Pneumococcal Conjugate-13 02/15/2014   Pneumococcal Polysaccharide-23 06/15/2008   Rsv, Bivalent, Protein Subunit Rsvpref,pf Verdis Frederickson) 01/04/2022   Zoster, Live 06/16/2010   TDAP status: Due, Education has been provided regarding the importance of this vaccine. Advised may receive this vaccine at local pharmacy or Health Dept. Aware to provide a copy of the vaccination record if obtained from local pharmacy or Health Dept. Verbalized acceptance and understanding.  Covid-19 vaccine status: Completed vaccines x4.   Shingrix Completed?: No.    Education has been provided regarding the importance of this vaccine. Patient has been advised to call insurance company to determine out of pocket expense if they have not yet received this vaccine. Advised may also receive vaccine at local pharmacy or Health Dept. Verbalized acceptance and understanding.  Screening Tests Health Maintenance  Topic Date Due   DTaP/Tdap/Td (1 - Tdap) Never done   COVID-19 Vaccine (5 - 2023-24 season) 08/25/2022 (Originally 03/11/2022)   Zoster Vaccines- Shingrix (1 of 2) 11/08/2022 (Originally 01/10/1960)   INFLUENZA VACCINE  11/14/2022   Medicare Annual Wellness (AWV)  08/09/2023   Pneumonia Vaccine 12+ Years old  Completed   HPV VACCINES  Aged Out    Health Maintenance Health Maintenance Due  Topic Date Due   DTaP/Tdap/Td (1 - Tdap) Never done   Lung Cancer Screening: (Low Dose CT Chest recommended if Age 45-80 years, 30 pack-year currently smoking OR have quit w/in 15years.) does not qualify.   Hepatitis C Screening: does not qualify.  Vision Screening: Recommended annual ophthalmology  exams for early detection of glaucoma and other disorders of the eye.  Dental Screening: Recommended annual dental exams for proper oral hygiene  Community Resource Referral / Chronic Care Management: CRR required this visit?  No   CCM required this visit?  No      Plan:     I have personally reviewed and noted the following in the patient's chart:   Medical and social history Use of alcohol, tobacco or illicit drugs  Current medications and supplements including opioid prescriptions. Patient is not currently taking opioid prescriptions. Functional ability and status Nutritional status Physical activity Advanced directives List of other physicians Hospitalizations, surgeries, and ER visits in previous 12 months Vitals Screenings to include cognitive, depression, and falls Referrals and appointments  In addition, I have reviewed and discussed with patient certain preventive protocols, quality metrics, and best practice recommendations. A written personalized care plan for preventive services as well as general preventive health recommendations were provided to patient.     Cathey Endow, LPN   2/95/[id number]

## 2022-08-09 NOTE — Patient Instructions (Addendum)
Blake Osborne , Thank you for taking time to come for your Medicare Wellness Visit. I appreciate your ongoing commitment to your health goals. Please review the following plan we discussed and let me know if I can assist you in the future.   These are the goals we discussed:  Goals      Increase water intake     Stay hydrated        This is a list of the screening recommended for you and due dates:  Health Maintenance  Topic Date Due   DTaP/Tdap/Td vaccine (1 - Tdap) Never done   COVID-19 Vaccine (5 - 2023-24 season) 08/25/2022*   Zoster (Shingles) Vaccine (1 of 2) 11/08/2022*   Flu Shot  11/14/2022   Medicare Annual Wellness Visit  08/09/2023   Pneumonia Vaccine  Completed   HPV Vaccine  Aged Out  *Topic was postponed. The date shown is not the original due date.    Advanced directives: End of life planning; Advance aging; Advanced directives discussed.  Copy of current HCPOA/Living Will requested.    Conditions/risks identified: none new  Next appointment: Follow up in one year for your annual wellness visit.   Preventive Care 15 Years and Older, Male  Preventive care refers to lifestyle choices and visits with your health care provider that can promote health and wellness. What does preventive care include? A yearly physical exam. This is also called an annual well check. Dental exams once or twice a year. Routine eye exams. Ask your health care provider how often you should have your eyes checked. Personal lifestyle choices, including: Daily care of your teeth and gums. Regular physical activity. Eating a healthy diet. Avoiding tobacco and drug use. Limiting alcohol use. Practicing safe sex. Taking low doses of aspirin every day. Taking vitamin and mineral supplements as recommended by your health care provider. What happens during an annual well check? The services and screenings done by your health care provider during your annual well check will depend on your  age, overall health, lifestyle risk factors, and family history of disease. Counseling  Your health care provider may ask you questions about your: Alcohol use. Tobacco use. Drug use. Emotional well-being. Home and relationship well-being. Sexual activity. Eating habits. History of falls. Memory and ability to understand (cognition). Work and work Astronomer. Screening  You may have the following tests or measurements: Height, weight, and BMI. Blood pressure. Lipid and cholesterol levels. These may be checked every 5 years, or more frequently if you are over 47 years old. Skin check. Lung cancer screening. You may have this screening every year starting at age 51 if you have a 30-pack-year history of smoking and currently smoke or have quit within the past 15 years. Fecal occult blood test (FOBT) of the stool. You may have this test every year starting at age 35. Flexible sigmoidoscopy or colonoscopy. You may have a sigmoidoscopy every 5 years or a colonoscopy every 10 years starting at age 83. Prostate cancer screening. Recommendations will vary depending on your family history and other risks. Hepatitis C blood test. Hepatitis B blood test. Sexually transmitted disease (STD) testing. Diabetes screening. This is done by checking your blood sugar (glucose) after you have not eaten for a while (fasting). You may have this done every 1-3 years. Abdominal aortic aneurysm (AAA) screening. You may need this if you are a current or former smoker. Osteoporosis. You may be screened starting at age 2 if you are at high risk. Talk with  your health care provider about your test results, treatment options, and if necessary, the need for more tests. Vaccines  Your health care provider may recommend certain vaccines, such as: Influenza vaccine. This is recommended every year. Tetanus, diphtheria, and acellular pertussis (Tdap, Td) vaccine. You may need a Td booster every 10 years. Zoster  vaccine. You may need this after age 10. Pneumococcal 13-valent conjugate (PCV13) vaccine. One dose is recommended after age 9. Pneumococcal polysaccharide (PPSV23) vaccine. One dose is recommended after age 73. Talk to your health care provider about which screenings and vaccines you need and how often you need them. This information is not intended to replace advice given to you by your health care provider. Make sure you discuss any questions you have with your health care provider. Document Released: 04/28/2015 Document Revised: 12/20/2015 Document Reviewed: 01/31/2015 Elsevier Interactive Patient Education  2017 [place] Prevention in the Home Falls can cause injuries. They can happen to people of all ages. There are many things you can do to make your home safe and to help prevent falls. What can I do on the outside of my home? Regularly fix the edges of walkways and driveways and fix any cracks. Remove anything that might make you trip as you walk through a door, such as a raised step or threshold. Trim any bushes or trees on the path to your home. Use bright outdoor lighting. Clear any walking paths of anything that might make someone trip, such as rocks or tools. Regularly check to see if handrails are loose or broken. Make sure that both sides of any steps have handrails. Any raised decks and porches should have guardrails on the edges. Have any leaves, snow, or ice cleared regularly. Use sand or salt on walking paths during winter. Clean up any spills in your garage right away. This includes oil or grease spills. What can I do in the bathroom? Use night lights. Install grab bars by the toilet and in the tub and shower. Do not use towel bars as grab bars. Use non-skid mats or decals in the tub or shower. If you need to sit down in the shower, use a plastic, non-slip stool. Keep the floor dry. Clean up any water that spills on the floor as soon as it happens. Remove  soap buildup in the tub or shower regularly. Attach bath mats securely with double-sided non-slip rug tape. Do not have throw rugs and other things on the floor that can make you trip. What can I do in the bedroom? Use night lights. Make sure that you have a light by your bed that is easy to reach. Do not use any sheets or blankets that are too big for your bed. They should not hang down onto the floor. Have a firm chair that has side arms. You can use this for support while you get dressed. Do not have throw rugs and other things on the floor that can make you trip. What can I do in the kitchen? Clean up any spills right away. Avoid walking on wet floors. Keep items that you use a lot in easy-to-reach places. If you need to reach something above you, use a strong step stool that has a grab bar. Keep electrical cords out of the way. Do not use floor polish or wax that makes floors slippery. If you must use wax, use non-skid floor wax. Do not have throw rugs and other things on the floor that can make you trip.  What can I do with my stairs? Do not leave any items on the stairs. Make sure that there are handrails on both sides of the stairs and use them. Fix handrails that are broken or loose. Make sure that handrails are as long as the stairways. Check any carpeting to make sure that it is firmly attached to the stairs. Fix any carpet that is loose or worn. Avoid having throw rugs at the top or bottom of the stairs. If you do have throw rugs, attach them to the floor with carpet tape. Make sure that you have a light switch at the top of the stairs and the bottom of the stairs. If you do not have them, ask someone to add them for you. What else can I do to help prevent falls? Wear shoes that: Do not have high heels. Have rubber bottoms. Are comfortable and fit you well. Are closed at the toe. Do not wear sandals. If you use a stepladder: Make sure that it is fully opened. Do not climb a  closed stepladder. Make sure that both sides of the stepladder are locked into place. Ask someone to hold it for you, if possible. Clearly mark and make sure that you can see: Any grab bars or handrails. First and last steps. Where the edge of each step is. Use tools that help you move around (mobility aids) if they are needed. These include: Canes. Walkers. Scooters. Crutches. Turn on the lights when you go into a dark area. Replace any light bulbs as soon as they burn out. Set up your furniture so you have a clear path. Avoid moving your furniture around. If any of your floors are uneven, fix them. If there are any pets around you, be aware of where they are. Review your medicines with your doctor. Some medicines can make you feel dizzy. This can increase your chance of falling. Ask your doctor what other things that you can do to help prevent falls. This information is not intended to replace advice given to you by your health care provider. Make sure you discuss any questions you have with your health care provider. Document Released: 01/26/2009 Document Revised: 09/07/2015 Document Reviewed: 05/06/2014 Elsevier Interactive Patient Education  2017 [employer].

## 2022-08-09 NOTE — Progress Notes (Signed)
   Subjective:   Blake Osborne is a 82 y.o. male who presents for Medicare Annual/Subsequent preventive examination.  Nurse Notes: This is a second note.   Patient Medicare AWV questionnaire was completed by the patient on 08/05/22, I have confirmed that all information answered by patient is correct and no changes since this date.          Cathey Endow, LPN   1/61/0960

## 2022-08-17 ENCOUNTER — Other Ambulatory Visit: Payer: Self-pay | Admitting: Family Medicine

## 2022-08-17 DIAGNOSIS — Z7901 Long term (current) use of anticoagulants: Secondary | ICD-10-CM

## 2022-10-12 ENCOUNTER — Other Ambulatory Visit: Payer: Self-pay | Admitting: Family Medicine

## 2022-10-12 DIAGNOSIS — I10 Essential (primary) hypertension: Secondary | ICD-10-CM

## 2022-10-23 ENCOUNTER — Ambulatory Visit
Admission: EM | Admit: 2022-10-23 | Discharge: 2022-10-23 | Disposition: A | Discharge status: Home / Self Care | Payer: Medicare Other | Attending: Family Medicine | Admitting: Family Medicine

## 2022-10-23 ENCOUNTER — Telehealth: Payer: Medicare Other | Admitting: Adult Health

## 2022-10-23 DIAGNOSIS — J069 Acute upper respiratory infection, unspecified: Secondary | ICD-10-CM | POA: Diagnosis not present

## 2022-10-23 DIAGNOSIS — U071 COVID-19: Secondary | ICD-10-CM | POA: Diagnosis not present

## 2022-10-23 LAB — SARS CORONAVIRUS 2 BY RT PCR: SARS Coronavirus 2 by RT PCR: POSITIVE — AB

## 2022-10-23 LAB — GROUP A STREP BY PCR: Group A Strep by PCR: NOT DETECTED

## 2022-10-23 MED ORDER — PAXLOVID (300/100) 20 X 150 MG & 10 X 100MG PO TBPK: 3.0000 | ORAL_TABLET | Freq: Two times a day (BID) | ORAL | 0 refills | Status: AC

## 2022-10-23 MED ORDER — ONDANSETRON 4 MG PO TBDP: 4.0000 mg | ORAL_TABLET | Freq: Three times a day (TID) | ORAL | 0 refills | Status: DC | PRN

## 2022-10-23 MED ORDER — ONDANSETRON 4 MG PO TBDP
4.0000 mg | ORAL_TABLET | Freq: Once | ORAL | Status: AC
  Administered 2022-10-23: 4 mg via ORAL

## 2022-10-23 MED ORDER — PROMETHAZINE-DM 6.25-15 MG/5ML PO SYRP: 2.5000 mL | ORAL_SOLUTION | Freq: Four times a day (QID) | ORAL | 0 refills | Status: DC | PRN

## 2022-10-23 NOTE — Discharge Instructions (Addendum)
While taking Eliquis and Paxlovid take 1/2 tablet instead of a whole tablet 2 times a day then resume 1 tablet twice a day after you complete Paxlovid.   Your strep test is negative however your test for COVID-19 was positive, meaning that you were infected with the novel coronavirus and could give the germ to others.  The recommendations suggest returning to normal activities when, for at least 24 hours, symptoms are improving overall, and if a fever was present, it has been gone without use of a fever-reducing medication.  You should wear a mask for the next 5 days to prevent the spread of disease. Please continue good preventive care measures, including:  frequent hand-washing, avoid touching your face, cover coughs/sneezes, stay out of crowds and keep a 6 foot distance from others.  Go to the nearest hospital emergency room if fever/cough/breathlessness are severe or illness seems like a threat to life.  If your were prescribed medication. Stop by the pharmacy to pick it up. You can take Tylenol and/or Ibuprofen as needed for fever reduction and pain relief.    For cough: honey 1/2 to 1 teaspoon (you can dilute the honey in water or another fluid).  You can also use guaifenesin and dextromethorphan for cough. You can use a humidifier for chest congestion and cough.  If you don't have a humidifier, you can sit in the bathroom with the hot shower running.      For sore throat: try warm salt water gargles, Mucinex sore throat cough drops or cepacol lozenges, throat spray, warm tea or water with lemon/honey, popsicles or ice, or OTC cold relief medicine for throat discomfort. You can also purchase chloraseptic spray at the pharmacy or dollar store.   For congestion: take a daily anti-histamine like Zyrtec, Claritin, and a oral decongestant, such as pseudoephedrine.  You can also use Flonase 1-2 sprays in each nostril daily. Afrin is also a good option, if you do not have high blood pressure.    It is  important to stay hydrated: drink plenty of fluids (water, gatorade/powerade/pedialyte, juices, or teas) to keep your throat moisturized and help further relieve irritation/discomfort.    Return or go to the Emergency Department if symptoms worsen or do not improve in the next few days

## 2022-10-23 NOTE — ED Provider Notes (Signed)
[facility]    CSN: [id number] Arrival date & time: 10/23/22  1154      History   Chief Complaint Chief Complaint  Patient presents with   Covid Exposure   Cough   Sore Throat   Headache   Fatigue    HPI Blake Osborne is a 82 y.o. male.   HPI  History obtained from the patient. Blake Osborne presents for COVID exposure. Heh as been sick to his stomach but no vomiting.  Not been having an appetite.  Experience and cough, sore throat, painful swallowing, headache, fatigue and joint pain that started 4 days ago.  Of note, his wife has been sick and has COVID.        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   Prediabetes 02/08/2022   AKI (acute kidney injury) (HCC) 12/27/2019   Thrombocytopenia (HCC) 09/20/2019   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   Head injury 05/29/2018   Trochanteric bursitis of left hip 08/20/2017   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   Lightheadedness 06/18/2017   Proteinuria 06/18/2017   Seizure disorder (HCC) 04/26/2017   History of stroke 03/18/2017   Anxiety 03/18/2017   Anticoagulated 01/07/2017   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 Authorizing Provider  allopurinol (ZYLOPRIM) 100 MG tablet TAKE 1/2 TABLET(50 MG) BY MOUTH DAILY 07/19/22  Yes [name], MD  benzonatate (TESSALON) 200 MG capsule Take 1 capsule (200 mg total) by mouth 3 (three) times daily as needed for cough. 04/07/22  Yes Shirlee Latch, PA-C  cetirizine (ZYRTEC) 10 MG tablet Take 10 mg by mouth daily as needed.    Yes [provider]  ELIQUIS 5 MG TABS tablet TAKE 1 TABLET(5 MG) BY MOUTH TWICE DAILY 08/20/22  Yes [name], MD  fluocinonide-emollient (LIDEX-E) 0.05 % cream APPLY TOPICALLY TO THE AFFECTED AREA DAILY AS NEEDED 01/08/22  Yes [name], MD  FLUoxetine (PROZAC) 40 MG capsule TAKE 1 CAPSULE(40 MG) BY MOUTH DAILY 04/29/22  Yes [name], MD  gabapentin (NEURONTIN) 100 MG capsule TAKE 2 CAPSULES(200 MG) BY MOUTH TWICE DAILY 05/20/22  Yes [name], MD  hydrochlorothiazide (MICROZIDE) 12.5 MG capsule TAKE 1 CAPSULE(12.5 MG) BY MOUTH DAILY 04/03/22  Yes [name], MD  isosorbide mononitrate (IMDUR) 30 MG 24 hr tablet TAKE 1 TABLET BY MOUTH EVERY DAY 07/10/22  Yes [name], MD  levETIRAcetam (KEPPRA  XR) 500 MG 24 hr tablet Take 1,000-1,500 mg by mouth 2 (two) times daily. Take 2 tablets by mouth in the am and 3 tablets by mouth in the pm   Yes [provider]  metoprolol succinate (TOPROL-XL) 25 MG 24 hr tablet TAKE 1 TABLET(25 MG) BY MOUTH DAILY 10/14/22  Yes [name], MD  nirmatrelvir & ritonavir (PAXLOVID, 300/100,) 20 x 150 MG & 10 x 100MG  TBPK Take 3 tablets by mouth 2 (two) times daily for 5 days. 10/23/22 10/28/22 Yes Rykker Coviello, DO  ondansetron (ZOFRAN-ODT) 4 MG disintegrating tablet Take 1 tablet (4 mg total) by mouth every 8 (eight) hours as needed. For nausea 10/23/22  Yes Reid Regas,  DO  promethazine-dextromethorphan (PROMETHAZINE-DM) 6.25-15 MG/5ML syrup Take 2.5-5 mLs by mouth 4 (four) times daily as needed. 10/23/22  Yes Justine Cossin, DO  rosuvastatin (CRESTOR) 40 MG tablet TAKE 1 TABLET(40 MG) BY MOUTH DAILY 07/19/22  Yes [name], MD  triamcinolone cream (KENALOG) 0.5 % APPLY TOPICALLY TO THE AFFECTED AREA DAILY AS NEEDED FOR RASH 01/08/22  Yes [name], MD    Family History Family History  Problem Relation Age of Onset   Heart disease Father    Heart attack Father    Aneurysm Brother        heart    Social History Social History   Tobacco Use   Smoking status: Former    Years: 8    Types: Cigarettes    Quit date: 10/01/1959    Years since quitting: 63.1   Smokeless tobacco: Never  Vaping Use   Vaping Use: Never used  Substance Use Topics   Alcohol use: Yes    Alcohol/week: 1.0 standard drink of alcohol    Types: 1 Glasses of wine per week    Comment: occasional glass of wine   Drug use: No     Allergies   Augmentin [amoxicillin-pot clavulanate], Dust mite extract, Other, and Pollen extract   Review of Systems Review of Systems: negative unless otherwise stated in HPI.      Physical Exam Triage Vital Signs ED Triage Vitals  Enc Vitals Group     BP 10/23/22 1217 115/70     Pulse Rate 10/23/22 1217 75     Resp 10/23/22 1217 16     Temp 10/23/22 1217 98.7 F (37.1 C)     Temp Source 10/23/22 1217 Oral     SpO2 10/23/22 1217 95 %     Weight 10/23/22 1216 205 lb (93 kg)     Height 10/23/22 1216 6' (1.829 m)     Head Circumference --      Peak Flow --      Pain Score 10/23/22 1222 5     Pain Loc --      Pain Edu? --      Excl. in GC? --    No data found.  Updated Vital Signs BP 115/70 (BP Location: Right Arm)   Pulse 75   Temp 98.7 F (37.1 C) (Oral)   Resp 16   Ht 6' (1.829 m)   Wt 93 kg   SpO2 95%   BMI 27.80 kg/m   Visual Acuity Right Eye Distance:   Left Eye Distance:   Bilateral Distance:     Right Eye Near:   Left Eye Near:    Bilateral Near:     Physical Exam GEN:     alert, non-toxic appearing elderly male in no distress  HENT:  mucus membranes moist, oropharyngeal without lesions or erythema, no tonsillar hypertrophy or exudates, no nasal discharge, wearing hearing aids EYES:   pupils equal and reactive, no scleral injection or discharge NECK:  good ROM, no meningismus   RESP:  no increased work of breathing, clear to auscultation bilaterally CVS:   regular rate and rhythm Skin:   warm and dry    UC Treatments / Results  Labs (all labs ordered are listed, but only abnormal results are displayed) Labs Reviewed  SARS CORONAVIRUS 2 BY RT PCR - Abnormal; Notable for the following components:      Result Value   SARS Coronavirus 2 by RT PCR POSITIVE (*)    All other components within normal limits  GROUP A STREP BY PCR    EKG   Radiology No results found.  Procedures Procedures (including critical care time)  Medications Ordered in UC Medications  ondansetron (ZOFRAN-ODT) disintegrating tablet 4 mg (4 mg Oral Given 10/23/22 1329)    Initial Impression / Assessment and Plan / UC Course  I have reviewed the triage vital signs and the nursing notes.  Pertinent labs & imaging results that were available during my care of the patient were reviewed by me and considered in my medical decision making (see chart for details).       Pt is a 82 y.o. male who presents after COVID exposure. Overall he is non-toxic appearing, well-hydrated and in no respiratory distress. VSS and he is afebrile satting 95% on room air.  Strep testing is negative.  COVID testing obtained and he was positive for COVID.  Zofran here for nausea.  Discussed symptomatic treatment. Treat cough with Promethazine DM and Zofran for nausea.  Pulmonary exam he has equal aeration bilaterally, imaging deferred.  He is  interested in treatment for COVID. After shared decision making, rx for  Paxlovid sent to pharmacy.  Patient to take half a tablet of Eliquis twice a day while taking Paxlovid.  Work note offered and provided if needed.  Isolation and quarantine instructions provided. Typical duration of symptoms discussed. ED and return precautions and understanding voiced. Discussed MDM, treatment plan and plan for follow-up with patient who agrees with plan.     Final Clinical Impressions(s) / UC Diagnoses   Final diagnoses:  COVID-19  Viral URI with cough     Discharge Instructions      While taking Eliquis and Paxlovid take 1/2 tablet instead of a whole tablet 2 times a day then resume 1 tablet twice a day after you complete Paxlovid.   Your strep test is negative however your test for COVID-19 was positive, meaning that you were infected with the novel coronavirus and could give the germ to others.  The recommendations suggest returning to normal activities when, for at least 24 hours, symptoms are improving overall, and if a fever was present, it has been gone without use of a fever-reducing medication.  You should wear a mask for the next 5 days to prevent the spread of disease. Please continue good preventive care measures, including:  frequent hand-washing, avoid touching your face, cover coughs/sneezes, stay out of crowds and keep a 6 foot distance from others.  Go to the nearest hospital emergency room if fever/cough/breathlessness are severe or illness seems like a threat to life.  If your were prescribed medication. Stop by the pharmacy to pick it up. You can take Tylenol and/or Ibuprofen as needed for fever reduction and pain relief.    For  cough: honey 1/2 to 1 teaspoon (you can dilute the honey in water or another fluid).  You can also use guaifenesin and dextromethorphan for cough. You can use a humidifier for chest congestion and cough.  If you don't have a humidifier, you can sit in the bathroom with the hot shower running.      For sore throat: try warm salt  water gargles, Mucinex sore throat cough drops or cepacol lozenges, throat spray, warm tea or water with lemon/honey, popsicles or ice, or OTC cold relief medicine for throat discomfort. You can also purchase chloraseptic spray at the pharmacy or dollar store.   For congestion: take a daily anti-histamine like Zyrtec, Claritin, and a oral decongestant, such as pseudoephedrine.  You can also use Flonase 1-2 sprays in each nostril daily. Afrin is also a good option, if you do not have high blood pressure.    It is important to stay hydrated: drink plenty of fluids (water, gatorade/powerade/pedialyte, juices, or teas) to keep your throat moisturized and help further relieve irritation/discomfort.    Return or go to the Emergency Department if symptoms worsen or do not improve in the next few days      ED Prescriptions     Medication Sig Dispense Auth. Provider   nirmatrelvir & ritonavir (PAXLOVID, 300/100,) 20 x 150 MG & 10 x 100MG  TBPK Take 3 tablets by mouth 2 (two) times daily for 5 days. 30 tablet Estefany Goebel, DO   ondansetron (ZOFRAN-ODT) 4 MG disintegrating tablet Take 1 tablet (4 mg total) by mouth every 8 (eight) hours as needed. For nausea 20 tablet Madysin Crisp, DO   promethazine-dextromethorphan (PROMETHAZINE-DM) 6.25-15 MG/5ML syrup Take 2.5-5 mLs by mouth 4 (four) times daily as needed. 118 mL Katha Cabal, DO      PDMP not reviewed this encounter.   Katha Cabal, DO 10/23/22 1340

## 2022-10-23 NOTE — ED Triage Notes (Signed)
Pt c/o HA,sore throat,cough,bodyaches & fatigue x3 days. States wife tested + for covid yesterday.

## 2022-10-24 ENCOUNTER — Ambulatory Visit: Payer: Medicare Other | Admitting: Nurse Practitioner

## 2022-10-24 ENCOUNTER — Telehealth: Payer: Medicare Other | Admitting: Nurse Practitioner

## 2022-11-28 DIAGNOSIS — I2510 Atherosclerotic heart disease of native coronary artery without angina pectoris: Secondary | ICD-10-CM | POA: Diagnosis not present

## 2022-11-28 DIAGNOSIS — I4430 Unspecified atrioventricular block: Secondary | ICD-10-CM | POA: Diagnosis not present

## 2022-11-28 DIAGNOSIS — Z950 Presence of cardiac pacemaker: Secondary | ICD-10-CM | POA: Diagnosis not present

## 2022-11-28 DIAGNOSIS — Z951 Presence of aortocoronary bypass graft: Secondary | ICD-10-CM | POA: Diagnosis not present

## 2022-11-28 DIAGNOSIS — I10 Essential (primary) hypertension: Secondary | ICD-10-CM | POA: Diagnosis not present

## 2022-11-28 DIAGNOSIS — I4811 Longstanding persistent atrial fibrillation: Secondary | ICD-10-CM | POA: Diagnosis not present

## 2022-11-28 DIAGNOSIS — Z7901 Long term (current) use of anticoagulants: Secondary | ICD-10-CM | POA: Diagnosis not present

## 2022-12-05 ENCOUNTER — Other Ambulatory Visit: Payer: Self-pay | Admitting: Family Medicine

## 2022-12-05 DIAGNOSIS — I10 Essential (primary) hypertension: Secondary | ICD-10-CM

## 2022-12-06 NOTE — Telephone Encounter (Signed)
1 refill sent pharmacy.  Patient needs follow-up visit for future refills.

## 2022-12-25 DIAGNOSIS — Z23 Encounter for immunization: Secondary | ICD-10-CM | POA: Diagnosis not present

## 2022-12-31 DIAGNOSIS — E782 Mixed hyperlipidemia: Secondary | ICD-10-CM | POA: Diagnosis not present

## 2022-12-31 DIAGNOSIS — I480 Paroxysmal atrial fibrillation: Secondary | ICD-10-CM | POA: Diagnosis not present

## 2022-12-31 DIAGNOSIS — Z7689 Persons encountering health services in other specified circumstances: Secondary | ICD-10-CM | POA: Diagnosis not present

## 2022-12-31 DIAGNOSIS — I2510 Atherosclerotic heart disease of native coronary artery without angina pectoris: Secondary | ICD-10-CM | POA: Diagnosis not present

## 2022-12-31 DIAGNOSIS — I10 Essential (primary) hypertension: Secondary | ICD-10-CM | POA: Diagnosis not present

## 2023-01-03 DIAGNOSIS — Z7901 Long term (current) use of anticoagulants: Secondary | ICD-10-CM | POA: Diagnosis not present

## 2023-01-03 DIAGNOSIS — I4430 Unspecified atrioventricular block: Secondary | ICD-10-CM | POA: Diagnosis not present

## 2023-01-03 DIAGNOSIS — I4811 Longstanding persistent atrial fibrillation: Secondary | ICD-10-CM | POA: Diagnosis not present

## 2023-01-03 DIAGNOSIS — I10 Essential (primary) hypertension: Secondary | ICD-10-CM | POA: Diagnosis not present

## 2023-01-03 DIAGNOSIS — Z950 Presence of cardiac pacemaker: Secondary | ICD-10-CM | POA: Diagnosis not present

## 2023-01-03 DIAGNOSIS — I2510 Atherosclerotic heart disease of native coronary artery without angina pectoris: Secondary | ICD-10-CM | POA: Diagnosis not present

## 2023-01-03 DIAGNOSIS — Z951 Presence of aortocoronary bypass graft: Secondary | ICD-10-CM | POA: Diagnosis not present

## 2023-02-11 ENCOUNTER — Other Ambulatory Visit: Payer: Self-pay | Admitting: Family Medicine

## 2023-02-15 ENCOUNTER — Other Ambulatory Visit: Payer: Self-pay | Admitting: Family Medicine

## 2023-02-15 DIAGNOSIS — Z7901 Long term (current) use of anticoagulants: Secondary | ICD-10-CM

## 2023-02-24 DIAGNOSIS — G40109 Localization-related (focal) (partial) symptomatic epilepsy and epileptic syndromes with simple partial seizures, not intractable, without status epilepticus: Secondary | ICD-10-CM | POA: Diagnosis not present

## 2023-02-24 DIAGNOSIS — Z5181 Encounter for therapeutic drug level monitoring: Secondary | ICD-10-CM | POA: Diagnosis not present

## 2023-02-24 DIAGNOSIS — I6930 Unspecified sequelae of cerebral infarction: Secondary | ICD-10-CM | POA: Diagnosis not present

## 2023-03-05 ENCOUNTER — Ambulatory Visit: Payer: Medicare Other | Admitting: Family Medicine

## 2023-03-05 ENCOUNTER — Encounter: Payer: Self-pay | Admitting: Family Medicine

## 2023-03-05 VITALS — BP 124/74 | HR 79 | Temp 97.8°F | Ht 72.0 in | Wt 210.8 lb

## 2023-03-05 DIAGNOSIS — I10 Essential (primary) hypertension: Secondary | ICD-10-CM | POA: Diagnosis not present

## 2023-03-05 DIAGNOSIS — R7303 Prediabetes: Secondary | ICD-10-CM | POA: Diagnosis not present

## 2023-03-05 DIAGNOSIS — M109 Gout, unspecified: Secondary | ICD-10-CM | POA: Diagnosis not present

## 2023-03-05 DIAGNOSIS — R600 Localized edema: Secondary | ICD-10-CM | POA: Insufficient documentation

## 2023-03-05 LAB — CBC
HCT: 42.7 % (ref 39.0–52.0)
Hemoglobin: 13.8 g/dL (ref 13.0–17.0)
MCHC: 32.3 g/dL (ref 30.0–36.0)
MCV: 98.9 fL (ref 78.0–100.0)
Platelets: 123 10*3/uL — ABNORMAL LOW (ref 150.0–400.0)
RBC: 4.31 Mil/uL (ref 4.22–5.81)
RDW: 13.5 % (ref 11.5–15.5)
WBC: 7.9 10*3/uL (ref 4.0–10.5)

## 2023-03-05 LAB — COMPREHENSIVE METABOLIC PANEL
ALT: 25 U/L (ref 0–53)
AST: 37 U/L (ref 0–37)
Albumin: 4.2 g/dL (ref 3.5–5.2)
Alkaline Phosphatase: 86 U/L (ref 39–117)
BUN: 26 mg/dL — ABNORMAL HIGH (ref 6–23)
CO2: 31 meq/L (ref 19–32)
Calcium: 9.6 mg/dL (ref 8.4–10.5)
Chloride: 102 meq/L (ref 96–112)
Creatinine, Ser: 1.31 mg/dL (ref 0.40–1.50)
GFR: 50.46 mL/min — ABNORMAL LOW (ref >60.00)
Glucose, Bld: 103 mg/dL — ABNORMAL HIGH (ref 70–99)
Potassium: 4.1 meq/L (ref 3.5–5.1)
Sodium: 138 meq/L (ref 135–145)
Total Bilirubin: 0.9 mg/dL (ref 0.2–1.2)
Total Protein: 7 g/dL (ref 6.0–8.3)

## 2023-03-05 LAB — LIPID PANEL
Cholesterol: 102 mg/dL (ref 0–200)
HDL: 43.1 mg/dL (ref >39.00)
LDL Cholesterol: 42 mg/dL (ref 0–99)
NonHDL: 59.06
Total CHOL/HDL Ratio: 2
Triglycerides: 85 mg/dL (ref 0.0–149.0)
VLDL: 17 mg/dL (ref 0.0–40.0)

## 2023-03-05 LAB — URIC ACID: Uric Acid, Serum: 7.2 mg/dL (ref 4.0–7.8)

## 2023-03-05 LAB — HEMOGLOBIN A1C: Hgb A1c MFr Bld: 6.1 % (ref 4.6–6.5)

## 2023-03-05 LAB — TSH: TSH: 2.54 u[IU]/mL (ref 0.35–5.50)

## 2023-03-05 MED ORDER — TRIAMCINOLONE ACETONIDE 0.5 % EX CREA: TOPICAL_CREAM | CUTANEOUS | 0 refills | Status: DC

## 2023-03-05 NOTE — Assessment & Plan Note (Signed)
Chronic issue.  No recent flares.  Seems to be controlled by dietary changes.  Check lab work.

## 2023-03-05 NOTE — Assessment & Plan Note (Signed)
Suspect related to venous insufficiency.  Encouraged compression socks.  Discussed if the socks he is wearing are not helping we could always prescribe compression stockings.  We will check lab work to rule out other underlying causes.

## 2023-03-05 NOTE — Progress Notes (Signed)
Blake Alar, MD Phone: [phone]  Blake Osborne Blake Osborne is a 82 y.o. male who presents today for follow-up.  Gout: No recent flares.  He limits his alcohol intake and watches his diet.  He is no longer on allopurinol.  Hypertension: Not checking blood pressures at home.  He is taking HCTZ, Imdur, and metoprolol.  No chest pain or shortness of breath.  Has had some edema in his legs and notes his cardiologist advised him to wear compression stockings.  No orthopnea or PND.  Social History   Tobacco Use  Smoking Status Former   Current packs/day: 0.00   Types: Cigarettes   Start date: 10/01/1952   Quit date: 10/01/1959   Years since quitting: 63.4  Smokeless Tobacco Never    Current Outpatient Medications on File Prior to Visit  Medication Sig Dispense Refill   allopurinol (ZYLOPRIM) 100 MG tablet TAKE 1/2 TABLET(50 MG) BY MOUTH DAILY 45 tablet 1   benzonatate (TESSALON) 200 MG capsule Take 1 capsule (200 mg total) by mouth 3 (three) times daily as needed for cough. 20 capsule 0   cetirizine (ZYRTEC) 10 MG tablet Take 10 mg by mouth daily as needed.      ELIQUIS 5 MG TABS tablet TAKE 1 TABLET(5 MG) BY MOUTH TWICE DAILY 180 tablet 1   fluocinonide-emollient (LIDEX-E) 0.05 % cream APPLY TOPICALLY TO THE AFFECTED AREA DAILY AS NEEDED 60 g 0   FLUoxetine (PROZAC) 40 MG capsule TAKE 1 CAPSULE(40 MG) BY MOUTH DAILY 90 capsule 3   gabapentin (NEURONTIN) 100 MG capsule TAKE 2 CAPSULES(200 MG) BY MOUTH TWICE DAILY 360 capsule 2   hydrochlorothiazide (MICROZIDE) 12.5 MG capsule TAKE 1 CAPSULE(12.5 MG) BY MOUTH DAILY 90 capsule 0   isosorbide mononitrate (IMDUR) 30 MG 24 hr tablet TAKE 1 TABLET BY MOUTH EVERY DAY 90 tablet 2   levETIRAcetam (KEPPRA XR) 500 MG 24 hr tablet Take 1,000-1,500 mg by mouth 2 (two) times daily. Take 2 tablets by mouth in the am and 3 tablets by mouth in the pm     metoprolol succinate (TOPROL-XL) 25 MG 24 hr tablet TAKE 1 TABLET(25 MG) BY MOUTH DAILY 90 tablet  0   ondansetron (ZOFRAN-ODT) 4 MG disintegrating tablet Take 1 tablet (4 mg total) by mouth every 8 (eight) hours as needed. For nausea 20 tablet 0   promethazine-dextromethorphan (PROMETHAZINE-DM) 6.25-15 MG/5ML syrup Take 2.5-5 mLs by mouth 4 (four) times daily as needed. 118 mL 0   rosuvastatin (CRESTOR) 40 MG tablet TAKE 1 TABLET(40 MG) BY MOUTH DAILY 90 tablet 1   No current facility-administered medications on file prior to visit.     ROS see history of present illness  Objective  Physical Exam Vitals:   03/05/23 1312  BP: 124/74  Pulse: 79  Temp: 97.8 F (36.6 C)  SpO2: 96%    BP Readings from Last 3 Encounters:  03/05/23 124/74  10/23/22 115/70  04/07/22 122/73   Wt Readings from Last 3 Encounters:  03/05/23 210 lb 12.8 oz (95.6 kg)  10/23/22 205 lb (93 kg)  08/09/22 215 lb (97.5 kg)    Physical Exam Constitutional:      General: He is not in acute distress.    Appearance: He is not diaphoretic.  Cardiovascular:     Rate and Rhythm: Normal rate and regular rhythm.     Heart sounds: Normal heart sounds.  Pulmonary:     Effort: Pulmonary effort is normal.     Breath sounds: Normal breath sounds.  Musculoskeletal:  Comments: 1+ pitting edema bilateral lower extremities  Skin:    General: Skin is warm and dry.  Neurological:     Mental Status: He is alert.      Assessment/Plan: Please see individual problem list.  Primary hypertension Assessment & Plan: Chronic issue.  Adequately controlled.  Patient will continue metoprolol 25 mg daily, Imdur 30 mg daily, and HCTZ 12.5 mg daily.  Orders: -     Comprehensive metabolic panel -     Lipid panel  Gout, unspecified cause, unspecified chronicity, unspecified site Assessment & Plan: Chronic issue.  No recent flares.  Seems to be controlled by dietary changes.  Check lab work.  Orders: -     CBC -     Uric acid  Bilateral lower extremity edema Assessment & Plan: Suspect related to venous  insufficiency.  Encouraged compression socks.  Discussed if the socks he is wearing are not helping we could always prescribe compression stockings.  We will check lab work to rule out other underlying causes.  Orders: -     TSH -     CBC  Prediabetes -     Hemoglobin A1c  Other orders -     Triamcinolone Acetonide; APPLY TOPICALLY TO THE AFFECTED AREA DAILY AS NEEDED FOR RASH  Dispense: 30 g; Refill: 0    Return in about 6 months (around 09/02/2023) for transfer of care.   Blake Alar, MD [facility]

## 2023-03-05 NOTE — Assessment & Plan Note (Signed)
Chronic issue.  Adequately controlled.  Patient will continue metoprolol 25 mg daily, Imdur 30 mg daily, and HCTZ 12.5 mg daily.

## 2023-04-05 DIAGNOSIS — Z7901 Long term (current) use of anticoagulants: Secondary | ICD-10-CM | POA: Diagnosis not present

## 2023-04-05 DIAGNOSIS — I10 Essential (primary) hypertension: Secondary | ICD-10-CM | POA: Diagnosis not present

## 2023-04-05 DIAGNOSIS — I4430 Unspecified atrioventricular block: Secondary | ICD-10-CM | POA: Diagnosis not present

## 2023-04-05 DIAGNOSIS — I4811 Longstanding persistent atrial fibrillation: Secondary | ICD-10-CM | POA: Diagnosis not present

## 2023-04-05 DIAGNOSIS — Z950 Presence of cardiac pacemaker: Secondary | ICD-10-CM | POA: Diagnosis not present

## 2023-04-05 DIAGNOSIS — I2510 Atherosclerotic heart disease of native coronary artery without angina pectoris: Secondary | ICD-10-CM | POA: Diagnosis not present

## 2023-04-05 DIAGNOSIS — Z951 Presence of aortocoronary bypass graft: Secondary | ICD-10-CM | POA: Diagnosis not present

## 2023-04-10 ENCOUNTER — Other Ambulatory Visit: Payer: Self-pay | Admitting: Family Medicine

## 2023-06-03 DIAGNOSIS — Z950 Presence of cardiac pacemaker: Secondary | ICD-10-CM | POA: Diagnosis not present

## 2023-06-03 DIAGNOSIS — R0609 Other forms of dyspnea: Secondary | ICD-10-CM | POA: Diagnosis not present

## 2023-06-03 DIAGNOSIS — Z951 Presence of aortocoronary bypass graft: Secondary | ICD-10-CM | POA: Diagnosis not present

## 2023-06-10 DIAGNOSIS — H2513 Age-related nuclear cataract, bilateral: Secondary | ICD-10-CM | POA: Diagnosis not present

## 2023-06-11 ENCOUNTER — Ambulatory Visit
Admission: EM | Admit: 2023-06-11 | Discharge: 2023-06-11 | Disposition: A | Discharge status: Home / Self Care | Payer: Medicare Other | Attending: Emergency Medicine | Admitting: Emergency Medicine

## 2023-06-11 DIAGNOSIS — J069 Acute upper respiratory infection, unspecified: Secondary | ICD-10-CM | POA: Diagnosis not present

## 2023-06-11 DIAGNOSIS — B09 Unspecified viral infection characterized by skin and mucous membrane lesions: Secondary | ICD-10-CM

## 2023-06-11 LAB — GROUP A STREP BY PCR: Group A Strep by PCR: NOT DETECTED

## 2023-06-11 MED ORDER — PROMETHAZINE-DM 6.25-15 MG/5ML PO SYRP: 5.0000 mL | ORAL_SOLUTION | Freq: Four times a day (QID) | ORAL | 0 refills | Status: DC | PRN

## 2023-06-11 MED ORDER — IPRATROPIUM BROMIDE 0.06 % NA SOLN: 2.0000 | Freq: Four times a day (QID) | NASAL | 12 refills | Status: DC

## 2023-06-11 MED ORDER — BENZONATATE 100 MG PO CAPS: 200.0000 mg | ORAL_CAPSULE | Freq: Three times a day (TID) | ORAL | 0 refills | Status: DC

## 2023-06-11 NOTE — ED Triage Notes (Signed)
 Pt c/o rash all over body,cough & congestion x1 wk. Has tried OTC meds w/o relief. States recently started new oat milk lotion.

## 2023-06-11 NOTE — Discharge Instructions (Addendum)
 Your strep test today was negative.  Your physical exam does reveal the presence of an upper respiratory infection.  As we discussed, since your symptoms have been going on for so long and you are outside the infectious window for COVID or influenza and you are also outside the therapeutic window for antivirals for either infection so we did not test you for either.  I do suspect that your rash is a result of your respiratory virus.  Take over-the-counter Claritin or Zyrtec during the day to help with itching and you may use Benadryl 50 mg at bedtime.  Use the Atrovent nasal spray, 2 squirts in each nostril every 6 hours, as needed for runny nose and postnasal drip.  Use the Tessalon Perles every 8 hours during the day.  Take them with a small sip of water.  They may give you some numbness to the base of your tongue or a metallic taste in your mouth, this is normal.  Use the Promethazine DM cough syrup at bedtime for cough and congestion.  It will make you drowsy so do not take it during the day.  Return for reevaluation or see your primary care provider for any new or worsening symptoms.

## 2023-06-11 NOTE — ED Provider Notes (Signed)
 [facility]    CSN: [id number] Arrival date & time: 06/11/23  1121      History   Chief Complaint Chief Complaint  Patient presents with   Rash   Nasal Congestion   Cough    HPI Blake Osborne is a 83 y.o. male.   HPI  83 year old male with a past medical history significant for atrial fibrillation, hyperlipidemia, hypertension, thyroid cytopenia, and pacemaker presents for evaluation of respiratory symptoms that started a week ago which include runny nose, nasal congestion, and a nonproductive cough.  He also endorses a "dry throat".  3 days ago he developed an allover body rash that is itchy.  He denies any fever, shortness breath, or wheezing.  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   Prediabetes 02/08/2022   AKI (acute kidney injury) (HCC) 12/27/2019   Thrombocytopenia (HCC) 09/20/2019   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   Head injury 05/29/2018   Trochanteric bursitis of left hip 08/20/2017   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   Lightheadedness 06/18/2017   Proteinuria 06/18/2017   Seizure disorder (HCC) 04/26/2017   History of stroke 03/18/2017   Anxiety 03/18/2017   Anticoagulated 01/07/2017   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 Authorizing Provider  allopurinol (ZYLOPRIM) 100 MG tablet TAKE 1/2 TABLET(50 MG) BY MOUTH DAILY 07/19/22  Yes [name], MD  benzonatate (TESSALON) 100 MG capsule Take 2 capsules (200 mg total) by mouth every 8 (eight) hours. 06/11/23  Yes Becky Augusta, NP  cetirizine (ZYRTEC) 10 MG tablet Take 10 mg by mouth daily as needed.    Yes [provider]  ELIQUIS 5 MG TABS tablet TAKE 1 TABLET(5 MG) BY MOUTH TWICE DAILY 02/18/23  Yes [name], MD  fluocinonide-emollient (LIDEX-E) 0.05 % cream APPLY TOPICALLY TO THE AFFECTED AREA DAILY AS NEEDED 01/08/22  Yes [name], MD  FLUoxetine (PROZAC) 40 MG capsule TAKE 1 CAPSULE(40 MG) BY MOUTH DAILY 04/10/23  Yes [name], MD  gabapentin (NEURONTIN) 100 MG capsule TAKE 2 CAPSULES(200 MG) BY MOUTH TWICE DAILY 02/11/23  Yes [name], MD  hydrochlorothiazide (MICROZIDE) 12.5 MG capsule TAKE 1 CAPSULE(12.5 MG) BY MOUTH DAILY 12/06/22  Yes [name], MD  ipratropium (ATROVENT) 0.06 % nasal spray Place 2 sprays into both nostrils 4 (four) times daily. 06/11/23  Yes Becky Augusta, NP  isosorbide  mononitrate (IMDUR) 30 MG 24 hr tablet TAKE 1 TABLET BY MOUTH EVERY DAY 07/10/22  Yes [name], MD  levETIRAcetam (KEPPRA XR) 500 MG 24 hr tablet Take 1,000-1,500 mg by mouth 2 (two) times daily. Take 2 tablets by mouth in the am and 3 tablets by mouth in the pm   Yes [provider]  metoprolol succinate (TOPROL-XL) 25 MG 24 hr tablet TAKE 1 TABLET(25 MG) BY MOUTH DAILY 10/14/22  Yes [name], MD  promethazine-dextromethorphan (PROMETHAZINE-DM) 6.25-15 MG/5ML syrup Take 5 mLs by  mouth 4 (four) times daily as needed. 06/11/23  Yes Becky Augusta, NP  rosuvastatin (CRESTOR) 40 MG tablet TAKE 1 TABLET(40 MG) BY MOUTH DAILY 07/19/22  Yes [name], MD  triamcinolone cream (KENALOG) 0.5 % APPLY TOPICALLY TO THE AFFECTED AREA DAILY AS NEEDED FOR RASH 03/05/23  Yes [name], MD    Family History Family History  Problem Relation Age of Onset   Heart disease Father    Heart attack Father    Aneurysm Brother        heart    Social History Social History   Tobacco Use   Smoking status: Former    Current packs/day: 0.00    Types: Cigarettes    Start date: 10/01/1951    Quit date: 10/01/1959    Years since quitting: 63.7   Smokeless tobacco: Never  Vaping Use   Vaping status: Never Used  Substance Use Topics   Alcohol use: Yes    Alcohol/week: 1.0 standard drink of alcohol    Types: 1 Glasses of wine per week    Comment: occasional glass of wine   Drug use: No     Allergies   Bee pollen, Dust mite extract, Other, Pollen extract, Amoxicillin-pot clavulanate, and Atorvastatin calcium   Review of Systems Review of Systems  Constitutional:  Negative for fever.  HENT:  Positive for congestion, rhinorrhea and sore throat. Negative for ear pain.   Respiratory:  Positive for cough. Negative for shortness of breath and wheezing.   Skin:  Positive for rash.     Physical Exam Triage Vital Signs ED Triage Vitals  Encounter Vitals Group     BP 06/11/23 1237 126/66     Systolic BP Percentile --      Diastolic BP Percentile --      Pulse Rate 06/11/23 1237 81     Resp 06/11/23 1237 16     Temp 06/11/23 1237 98.4 F (36.9 C)     Temp Source 06/11/23 1237 Oral     SpO2 06/11/23 1237 94 %     Weight 06/11/23 1236 215 lb (97.5 kg)     Height 06/11/23 1236 6\' 1"  (1.854 m)     Head Circumference --      Peak Flow --      Pain Score 06/11/23 1240 0     Pain Loc --      Pain Education --      Exclude from Growth Chart --    No data  found.  Updated Vital Signs BP 126/66 (BP Location: Left Arm)   Pulse 81   Temp 98.4 F (36.9 C) (Oral)   Resp 16   Ht 6\' 1"  (1.854 m)   Wt 215 lb (97.5 kg)   SpO2 94%   BMI 28.37 kg/m   Visual Acuity Right Eye Distance:   Left Eye Distance:   Bilateral Distance:    Right Eye Near:   Left  Eye Near:    Bilateral Near:     Physical Exam Vitals and nursing note reviewed.  Constitutional:      Appearance: Normal appearance. He is not ill-appearing.  HENT:     Head: Normocephalic and atraumatic.     Right Ear: Tympanic membrane, ear canal and external ear normal. There is no impacted cerumen.     Left Ear: Tympanic membrane, ear canal and external ear normal. There is no impacted cerumen.     Nose: Congestion and rhinorrhea present.     Comments: Nasal mucosa is erythematous and edematous with scant clear discharge in both nares.    Mouth/Throat:     Mouth: Mucous membranes are moist.     Pharynx: Oropharynx is clear. Posterior oropharyngeal erythema present. No oropharyngeal exudate.     Comments: Tonsillar pillars are unremarkable.  Posterior oropharynx demonstrates erythema and injection. Cardiovascular:     Rate and Rhythm: Normal rate and regular rhythm.     Pulses: Normal pulses.     Heart sounds: Normal heart sounds. No murmur heard.    No friction rub. No gallop.  Pulmonary:     Effort: Pulmonary effort is normal.     Breath sounds: No wheezing, rhonchi or rales.  Musculoskeletal:     Cervical back: Normal range of motion and neck supple. No tenderness.  Skin:    General: Skin is warm and dry.     Capillary Refill: Capillary refill takes less than 2 seconds.     Findings: Erythema and rash present.  Neurological:     General: No focal deficit present.     Mental Status: He is alert and oriented to person, place, and time.      UC Treatments / Results  Labs (all labs ordered are listed, but only abnormal results are displayed) Labs Reviewed  GROUP A  STREP BY PCR    EKG   Radiology No results found.  Procedures Procedures (including critical care time)  Medications Ordered in UC Medications - No data to display  Initial Impression / Assessment and Plan / UC Course  I have reviewed the triage vital signs and the nursing notes.  Pertinent labs & imaging results that were available during my care of the patient were reviewed by me and considered in my medical decision making (see chart for details).   Patient is a nontoxic-appearing 83 year old gentleman presenting for evaluation of 7 days worth the respiratory symptoms and 3 days worth of a body rash as outlined HPI above.  He does have inflamed nasal mucosa with scant clear nasal discharge.  He also has erythema to the posterior oropharynx with injection but no appreciable tonsillar hypertrophy or exudate.  Cardiopulmonary exam is benign.  No cervical lymphadenopathy present on exam.  The patient also has an erythematous, maculopapular rash with a sandpapery texture diffusely    Given the sandpapery texture and erythema to the posterior pharynx I am concerned about possible strep so I will order a strep PCR.  He is outside the infectious window and therapeutic window for antivirals for COVID and influenza at this time so I will not test for either.  Strep PCR is negative.  I will discharge patient home with a diagnosis of viral URI with a cough and viral exanthem.  He may use over-the-counter Claritin or Zyrtec during the day and Benadryl at night as needed for itching.  I will prescribe Atrovent nasal spray to help the nasal congestion and postnasal drip along with Tessalon Perles  and Promethazine DM cough syrup for cough and congestion.   Final Clinical Impressions(s) / UC Diagnoses   Final diagnoses:  Viral URI with cough  Viral exanthem     Discharge Instructions      Your strep test today was negative.  Your physical exam does reveal the presence of an upper  respiratory infection.  As we discussed, since your symptoms have been going on for so long and you are outside the infectious window for COVID or influenza and you are also outside the therapeutic window for antivirals for either infection so we did not test you for either.  I do suspect that your rash is a result of your respiratory virus.  Take over-the-counter Claritin or Zyrtec during the day to help with itching and you may use Benadryl 50 mg at bedtime.  Use the Atrovent nasal spray, 2 squirts in each nostril every 6 hours, as needed for runny nose and postnasal drip.  Use the Tessalon Perles every 8 hours during the day.  Take them with a small sip of water.  They may give you some numbness to the base of your tongue or a metallic taste in your mouth, this is normal.  Use the Promethazine DM cough syrup at bedtime for cough and congestion.  It will make you drowsy so do not take it during the day.  Return for reevaluation or see your primary care provider for any new or worsening symptoms.      ED Prescriptions     Medication Sig Dispense Auth. Provider   benzonatate (TESSALON) 100 MG capsule Take 2 capsules (200 mg total) by mouth every 8 (eight) hours. 21 capsule Becky Augusta, NP   ipratropium (ATROVENT) 0.06 % nasal spray Place 2 sprays into both nostrils 4 (four) times daily. 15 mL Becky Augusta, NP   promethazine-dextromethorphan (PROMETHAZINE-DM) 6.25-15 MG/5ML syrup Take 5 mLs by mouth 4 (four) times daily as needed. 118 mL Becky Augusta, NP      PDMP not reviewed this encounter.   Becky Augusta, NP 06/11/23 1340

## 2023-06-16 ENCOUNTER — Ambulatory Visit: Payer: Self-pay | Admitting: Family Medicine

## 2023-06-16 ENCOUNTER — Telehealth (INDEPENDENT_AMBULATORY_CARE_PROVIDER_SITE_OTHER): Payer: Self-pay | Admitting: Nurse Practitioner

## 2023-06-16 ENCOUNTER — Encounter: Payer: Self-pay | Admitting: Nurse Practitioner

## 2023-06-16 ENCOUNTER — Ambulatory Visit: Admitting: Family Medicine

## 2023-06-16 DIAGNOSIS — L509 Urticaria, unspecified: Secondary | ICD-10-CM

## 2023-06-16 MED ORDER — PREDNISONE 10 MG (21) PO TBPK: ORAL_TABLET | ORAL | 0 refills | Status: DC

## 2023-06-16 NOTE — Progress Notes (Signed)
 Virtual Visit Consent   Blake Osborne, you are scheduled for a virtual visit with Mary-Margaret Daphine Deutscher, FNP, a [facility] provider, today.     Just as with appointments in the office, your consent must be obtained to participate.  Your consent will be active for this visit and any virtual visit you may have with one of our providers in the next 365 days.     If you have a MyChart account, a copy of this consent can be sent to you electronically.  All virtual visits are billed to your insurance company just like a traditional visit in the office.    As this is a virtual visit, video technology does not allow for your provider to perform a traditional examination.  This may limit your provider's ability to fully assess your condition.  If your provider identifies any concerns that need to be evaluated in person or the need to arrange testing (such as labs, EKG, etc.), we will make arrangements to do so.     Although advances in technology are sophisticated, we cannot ensure that it will always work on either your end or our end.  If the connection with a video visit is poor, the visit may have to be switched to a telephone visit.  With either a video or telephone visit, we are not always able to ensure that we have a secure connection.     I need to obtain your verbal consent now.   Are you willing to proceed with your visit today? YES   Blake Osborne has provided verbal consent on 06/16/2023 for a virtual visit (video or telephone).   Mary-Margaret Daphine Deutscher, FNP   Date: 06/16/2023 1:53 PM   Virtual Visit via Video Note   I, Mary-Margaret Daphine Deutscher, connected with Blake Osborne ([id number], 22-Oct-1940) on 06/16/23 at  2:35 PM EST by a video-enabled telemedicine application and verified that I am speaking with the correct person using two identifiers.  Location: Patient: Virtual Visit Location Patient: Home Provider: Virtual Visit Location Provider: Mobile   I  discussed the limitations of evaluation and management by telemedicine and the availability of in person appointments. The patient expressed understanding and agreed to proceed.    History of Present Illness: Blake Osborne is a 83 y.o. who identifies as a male who was assigned male at birth, and is being seen today for rash.  HPI: Rash This is a new problem. The current episode started in the past 7 days. The problem has been waxing and waning since onset. The rash is diffuse. The rash is characterized by redness. Past treatments include antihistamine and topical steroids (aveeno). The treatment provided mild relief. There is no history of allergies.    Review of Systems  Skin:  Positive for rash.    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   Lightheadedness 06/18/2017   Proteinuria 06/18/2017   Seizure disorder (HCC) 04/26/2017   History of stroke 03/18/2017   Anxiety 03/18/2017   Anticoagulated 01/07/2017   Hemorrhagic infarction involving posterior cerebral circulation of  left side (HCC) 12/09/2016   Paroxysmal atrial fibrillation (HCC) 02/12/2016   Bradycardia 11/07/2015   Dermatitis 11/19/2013   Obesity (BMI 30-39.9) 11/19/2013   Gout 12/04/2011   Osteoarthritis of right knee 06/05/2011   Hypertension 12/03/2010   Hyperlipidemia 12/03/2010   Coronary arteriosclerosis 12/03/2010    Allergies:  Allergies  Allergen Reactions   Bee Pollen    Dust Mite Extract Other (See Comments)   Other Other (See Comments)   Pollen Extract    Amoxicillin-Pot  Clavulanate Nausea And Vomiting and Other (See Comments)    Abdominal pain  Abdominal pain    Other reaction(s): Other (See Comments) Abdominal pain Abdominal pain   Atorvastatin Calcium Rash    PROBABLY NOT DRUG EFFECT   Medications:  Current Outpatient Medications:    allopurinol (ZYLOPRIM) 100 MG tablet, TAKE 1/2 TABLET(50 MG) BY MOUTH DAILY, Disp: 45 tablet, Rfl: 1   benzonatate (TESSALON) 100 MG capsule, Take 2 capsules (200 mg total) by mouth every 8 (eight) hours., Disp: 21 capsule, Rfl: 0   cetirizine (ZYRTEC) 10 MG tablet, Take 10 mg by mouth daily as needed. , Disp: , Rfl:    ELIQUIS 5 MG TABS tablet, TAKE 1 TABLET(5 MG) BY MOUTH TWICE DAILY, Disp: 180 tablet, Rfl: 1   fluocinonide-emollient (LIDEX-E) 0.05 % cream, APPLY TOPICALLY TO THE AFFECTED AREA DAILY AS NEEDED, Disp: 60 g, Rfl: 0   FLUoxetine (PROZAC) 40 MG capsule, TAKE 1 CAPSULE(40 MG) BY MOUTH DAILY, Disp: 90 capsule, Rfl: 3   gabapentin (NEURONTIN) 100 MG capsule, TAKE 2 CAPSULES(200 MG) BY MOUTH TWICE DAILY, Disp: 360 capsule, Rfl: 2   hydrochlorothiazide (MICROZIDE) 12.5 MG capsule, TAKE 1 CAPSULE(12.5 MG) BY MOUTH DAILY, Disp: 90 capsule, Rfl: 0   ipratropium (ATROVENT) 0.06 % nasal spray, Place 2 sprays into both nostrils 4 (four) times daily., Disp: 15 mL, Rfl: 12   isosorbide mononitrate (IMDUR) 30 MG 24 hr tablet, TAKE 1 TABLET BY MOUTH EVERY DAY, Disp: 90 tablet, Rfl: 2   levETIRAcetam (KEPPRA XR) 500 MG 24 hr tablet, Take 1,000-1,500 mg by mouth 2 (two) times daily. Take 2 tablets by mouth in the am and 3 tablets by mouth in the pm, Disp: , Rfl:    metoprolol succinate (TOPROL-XL) 25 MG 24 hr tablet, TAKE 1 TABLET(25 MG) BY MOUTH DAILY, Disp: 90 tablet, Rfl: 0   promethazine-dextromethorphan (PROMETHAZINE-DM) 6.25-15 MG/5ML syrup, Take 5 mLs by mouth 4 (four) times daily as needed., Disp: 118 mL, Rfl: 0   rosuvastatin (CRESTOR) 40 MG tablet, TAKE 1 TABLET(40 MG) BY MOUTH DAILY, Disp: 90 tablet, Rfl: 1    triamcinolone cream (KENALOG) 0.5 %, APPLY TOPICALLY TO THE AFFECTED AREA DAILY AS NEEDED FOR RASH, Disp: 30 g, Rfl: 0  Observations/Objective: Patient is well-developed, well-nourished in no acute distress.  Resting comfortably  at home.  Head is normocephalic, atraumatic.  No labored breathing.  Speech is clear and coherent with logical content.  Patient is alert and oriented at baseline.  Diffuse maculo papular fine rash on trunk arms and legs  Assessment and Plan:  Beola Cord in today with chief complaint of rash  1. Urticaria (Primary) Avoid scratching Cool compresses Meds ordered this encounter  Medications   predniSONE (STERAPRED UNI-PAK 21 TAB) 10 MG (21) TBPK tablet    Sig: As directed x 6 days    Dispense:  21 tablet    Refill:  0    Supervising Provider:   Arville Care A [[id number]]  Follow Up Instructions: I discussed the assessment and treatment plan with the patient. The patient was provided an opportunity to ask questions and all were answered. The patient agreed with the plan and demonstrated an understanding of the instructions.  A copy of instructions were sent to the patient via MyChart.  The patient was advised to call back or seek an in-person evaluation if the symptoms worsen or if the condition fails to improve as anticipated.  Time:  I spent 8 minutes with the patient via telehealth technology discussing the above problems/concerns.    Mary-Margaret Daphine Deutscher, FNP

## 2023-06-16 NOTE — Telephone Encounter (Signed)
 Information obtained from son, Annette Stable.  Chief Complaint: rash, widespread Symptoms: rash, very itchy Frequency: about a week Pertinent Negatives: Patient denies fever, SOB, CP, difficulty breathing,   Disposition: [] ED /[] Urgent Care (no appt availability in office) / [x] Appointment(In office/virtual)/ []  [facility]/ [] Home Care/ [] Refused Recommended Disposition /[] [place]/ []  Follow-up with PCP  Additional Notes: Pt seen last week in UC, negative strep. Told he had virus. Annette Stable states that all s/s are gone with the exception of the rash.  Rash covers the entire body and per son "looks just like the measles rash when you google that". Attempts were made to schedule VV.  Scheduled today for VV with [facility]. Pt son requesting medication/lotion be rx'ed for the pt as pt has already been seen for this.    Copied from CRM [id number]. Topic: Clinical - Red Word Triage >> Jun 16, 2023 11:44 AM [place] A wrote: [facility] that prompted transfer to Nurse Triage: Patient's son called patient has rash all over his body. Rash hurts to touch, can not put clothing on Reason for Disposition  SEVERE itching (i.e., interferes with sleep, normal activities or school)  Answer Assessment - Initial Assessment Questions 1. APPEARANCE of RASH: "Describe the rash." (e.g., spots, blisters, raised areas, skin peeling, scaly)     If you google measles rash it looks just like measles.  2. SIZE: "How big are the spots?" (e.g., tip of pen, eraser, coin; inches, centimeters)     Tip of pen 3. LOCATION: "Where is the rash located?"     everywhere 4. COLOR: "What color is the rash?" (Note: It is difficult to assess rash color in people with darker-colored skin. When this situation occurs, simply ask the caller to describe what they see.)     Red bright 5. ONSET: "When did the rash begin?"     1 week 6. FEVER: "Do you have a fever?" If Yes, ask: "What is your temperature, how was it measured, and when  did it start?"     In the beginning he did, low grade 7. ITCHING: "Does the rash itch?" If Yes, ask: "How bad is the itch?" (Scale 1-10; or mild, moderate, severe)     10 8. CAUSE: "What do you think is causing the rash?"     Unsure, looks like measles, was told at [facility] that it was d/t the virus 9. MEDICINE FACTORS: "Have you started any new medicines within the last 2 weeks?" (e.g., antibiotics)      denies 10. OTHER SYMPTOMS: "Do you have any other symptoms?" (e.g., dizziness, headache, sore throat, joint pain)       Dizziness, started about 5 days ago  Protocols used: Rash or Redness - [facility]

## 2023-06-16 NOTE — Telephone Encounter (Signed)
 Noted.

## 2023-06-27 DIAGNOSIS — R0609 Other forms of dyspnea: Secondary | ICD-10-CM | POA: Diagnosis not present

## 2023-06-27 DIAGNOSIS — Z950 Presence of cardiac pacemaker: Secondary | ICD-10-CM | POA: Diagnosis not present

## 2023-06-27 DIAGNOSIS — Z951 Presence of aortocoronary bypass graft: Secondary | ICD-10-CM | POA: Diagnosis not present

## 2023-06-30 DIAGNOSIS — I2510 Atherosclerotic heart disease of native coronary artery without angina pectoris: Secondary | ICD-10-CM | POA: Diagnosis not present

## 2023-06-30 DIAGNOSIS — I10 Essential (primary) hypertension: Secondary | ICD-10-CM | POA: Diagnosis not present

## 2023-06-30 DIAGNOSIS — I619 Nontraumatic intracerebral hemorrhage, unspecified: Secondary | ICD-10-CM | POA: Diagnosis not present

## 2023-06-30 DIAGNOSIS — Z01818 Encounter for other preprocedural examination: Secondary | ICD-10-CM | POA: Diagnosis not present

## 2023-06-30 DIAGNOSIS — I480 Paroxysmal atrial fibrillation: Secondary | ICD-10-CM | POA: Diagnosis not present

## 2023-06-30 DIAGNOSIS — G40909 Epilepsy, unspecified, not intractable, without status epilepticus: Secondary | ICD-10-CM | POA: Diagnosis not present

## 2023-06-30 DIAGNOSIS — I4439 Other atrioventricular block: Secondary | ICD-10-CM | POA: Diagnosis not present

## 2023-07-06 DIAGNOSIS — I4430 Unspecified atrioventricular block: Secondary | ICD-10-CM | POA: Diagnosis not present

## 2023-07-06 DIAGNOSIS — Z7901 Long term (current) use of anticoagulants: Secondary | ICD-10-CM | POA: Diagnosis not present

## 2023-07-06 DIAGNOSIS — Z950 Presence of cardiac pacemaker: Secondary | ICD-10-CM | POA: Diagnosis not present

## 2023-07-06 DIAGNOSIS — I2510 Atherosclerotic heart disease of native coronary artery without angina pectoris: Secondary | ICD-10-CM | POA: Diagnosis not present

## 2023-07-06 DIAGNOSIS — Z951 Presence of aortocoronary bypass graft: Secondary | ICD-10-CM | POA: Diagnosis not present

## 2023-07-06 DIAGNOSIS — I10 Essential (primary) hypertension: Secondary | ICD-10-CM | POA: Diagnosis not present

## 2023-07-06 DIAGNOSIS — I4811 Longstanding persistent atrial fibrillation: Secondary | ICD-10-CM | POA: Diagnosis not present

## 2023-07-17 DIAGNOSIS — Z79899 Other long term (current) drug therapy: Secondary | ICD-10-CM | POA: Diagnosis not present

## 2023-07-17 DIAGNOSIS — H25812 Combined forms of age-related cataract, left eye: Secondary | ICD-10-CM | POA: Diagnosis not present

## 2023-07-17 DIAGNOSIS — N189 Chronic kidney disease, unspecified: Secondary | ICD-10-CM | POA: Diagnosis not present

## 2023-07-17 DIAGNOSIS — Z951 Presence of aortocoronary bypass graft: Secondary | ICD-10-CM | POA: Diagnosis not present

## 2023-07-17 DIAGNOSIS — E785 Hyperlipidemia, unspecified: Secondary | ICD-10-CM | POA: Diagnosis not present

## 2023-07-17 DIAGNOSIS — I129 Hypertensive chronic kidney disease with stage 1 through stage 4 chronic kidney disease, or unspecified chronic kidney disease: Secondary | ICD-10-CM | POA: Diagnosis not present

## 2023-07-17 DIAGNOSIS — G40909 Epilepsy, unspecified, not intractable, without status epilepticus: Secondary | ICD-10-CM | POA: Diagnosis not present

## 2023-07-17 DIAGNOSIS — I480 Paroxysmal atrial fibrillation: Secondary | ICD-10-CM | POA: Diagnosis not present

## 2023-07-17 DIAGNOSIS — H25813 Combined forms of age-related cataract, bilateral: Secondary | ICD-10-CM | POA: Diagnosis not present

## 2023-07-17 DIAGNOSIS — Z7901 Long term (current) use of anticoagulants: Secondary | ICD-10-CM | POA: Diagnosis not present

## 2023-07-17 DIAGNOSIS — I2510 Atherosclerotic heart disease of native coronary artery without angina pectoris: Secondary | ICD-10-CM | POA: Diagnosis not present

## 2023-08-14 DIAGNOSIS — I4891 Unspecified atrial fibrillation: Secondary | ICD-10-CM | POA: Diagnosis not present

## 2023-08-14 DIAGNOSIS — Z7901 Long term (current) use of anticoagulants: Secondary | ICD-10-CM | POA: Diagnosis not present

## 2023-08-14 DIAGNOSIS — Z950 Presence of cardiac pacemaker: Secondary | ICD-10-CM | POA: Diagnosis not present

## 2023-08-14 DIAGNOSIS — Z79899 Other long term (current) drug therapy: Secondary | ICD-10-CM | POA: Diagnosis not present

## 2023-08-14 DIAGNOSIS — I2510 Atherosclerotic heart disease of native coronary artery without angina pectoris: Secondary | ICD-10-CM | POA: Diagnosis not present

## 2023-08-14 DIAGNOSIS — I129 Hypertensive chronic kidney disease with stage 1 through stage 4 chronic kidney disease, or unspecified chronic kidney disease: Secondary | ICD-10-CM | POA: Diagnosis not present

## 2023-08-14 DIAGNOSIS — Z951 Presence of aortocoronary bypass graft: Secondary | ICD-10-CM | POA: Diagnosis not present

## 2023-08-14 DIAGNOSIS — Z955 Presence of coronary angioplasty implant and graft: Secondary | ICD-10-CM | POA: Diagnosis not present

## 2023-08-14 DIAGNOSIS — E785 Hyperlipidemia, unspecified: Secondary | ICD-10-CM | POA: Diagnosis not present

## 2023-08-14 DIAGNOSIS — H2511 Age-related nuclear cataract, right eye: Secondary | ICD-10-CM | POA: Diagnosis not present

## 2023-08-14 DIAGNOSIS — G40909 Epilepsy, unspecified, not intractable, without status epilepticus: Secondary | ICD-10-CM | POA: Diagnosis not present

## 2023-08-14 DIAGNOSIS — N189 Chronic kidney disease, unspecified: Secondary | ICD-10-CM | POA: Diagnosis not present

## 2023-08-14 DIAGNOSIS — H25811 Combined forms of age-related cataract, right eye: Secondary | ICD-10-CM | POA: Diagnosis not present

## 2023-08-14 DIAGNOSIS — H2513 Age-related nuclear cataract, bilateral: Secondary | ICD-10-CM | POA: Diagnosis not present

## 2023-09-02 ENCOUNTER — Encounter: Payer: Medicare Other | Admitting: Family Medicine

## 2023-09-12 DIAGNOSIS — Z950 Presence of cardiac pacemaker: Secondary | ICD-10-CM | POA: Diagnosis not present

## 2023-09-12 DIAGNOSIS — I442 Atrioventricular block, complete: Secondary | ICD-10-CM | POA: Diagnosis not present

## 2023-09-12 DIAGNOSIS — Z7689 Persons encountering health services in other specified circumstances: Secondary | ICD-10-CM | POA: Diagnosis not present

## 2023-09-12 DIAGNOSIS — I4811 Longstanding persistent atrial fibrillation: Secondary | ICD-10-CM | POA: Diagnosis not present

## 2023-09-12 DIAGNOSIS — I10 Essential (primary) hypertension: Secondary | ICD-10-CM | POA: Diagnosis not present

## 2023-10-06 DIAGNOSIS — Z951 Presence of aortocoronary bypass graft: Secondary | ICD-10-CM | POA: Diagnosis not present

## 2023-10-06 DIAGNOSIS — I4811 Longstanding persistent atrial fibrillation: Secondary | ICD-10-CM | POA: Diagnosis not present

## 2023-10-06 DIAGNOSIS — Z7901 Long term (current) use of anticoagulants: Secondary | ICD-10-CM | POA: Diagnosis not present

## 2023-10-06 DIAGNOSIS — Z950 Presence of cardiac pacemaker: Secondary | ICD-10-CM | POA: Diagnosis not present

## 2023-10-06 DIAGNOSIS — I2510 Atherosclerotic heart disease of native coronary artery without angina pectoris: Secondary | ICD-10-CM | POA: Diagnosis not present

## 2023-10-06 DIAGNOSIS — I4430 Unspecified atrioventricular block: Secondary | ICD-10-CM | POA: Diagnosis not present

## 2023-10-06 DIAGNOSIS — I10 Essential (primary) hypertension: Secondary | ICD-10-CM | POA: Diagnosis not present

## 2023-11-04 ENCOUNTER — Ambulatory Visit (INDEPENDENT_AMBULATORY_CARE_PROVIDER_SITE_OTHER)

## 2023-11-04 ENCOUNTER — Ambulatory Visit: Payer: Self-pay

## 2023-11-04 VITALS — BP 118/70 | HR 86 | Temp 98.6°F | Ht 73.0 in | Wt 218.8 lb

## 2023-11-04 DIAGNOSIS — D696 Thrombocytopenia, unspecified: Secondary | ICD-10-CM

## 2023-11-04 DIAGNOSIS — R7303 Prediabetes: Secondary | ICD-10-CM | POA: Diagnosis not present

## 2023-11-04 DIAGNOSIS — I480 Paroxysmal atrial fibrillation: Secondary | ICD-10-CM

## 2023-11-04 DIAGNOSIS — L739 Follicular disorder, unspecified: Secondary | ICD-10-CM | POA: Diagnosis not present

## 2023-11-04 DIAGNOSIS — I2510 Atherosclerotic heart disease of native coronary artery without angina pectoris: Secondary | ICD-10-CM

## 2023-11-04 DIAGNOSIS — Z7901 Long term (current) use of anticoagulants: Secondary | ICD-10-CM

## 2023-11-04 DIAGNOSIS — M109 Gout, unspecified: Secondary | ICD-10-CM

## 2023-11-04 DIAGNOSIS — Z950 Presence of cardiac pacemaker: Secondary | ICD-10-CM | POA: Diagnosis not present

## 2023-11-04 DIAGNOSIS — L309 Dermatitis, unspecified: Secondary | ICD-10-CM

## 2023-11-04 DIAGNOSIS — F39 Unspecified mood [affective] disorder: Secondary | ICD-10-CM | POA: Diagnosis not present

## 2023-11-04 DIAGNOSIS — I10 Essential (primary) hypertension: Secondary | ICD-10-CM | POA: Diagnosis not present

## 2023-11-04 DIAGNOSIS — E782 Mixed hyperlipidemia: Secondary | ICD-10-CM | POA: Diagnosis not present

## 2023-11-04 DIAGNOSIS — G40909 Epilepsy, unspecified, not intractable, without status epilepticus: Secondary | ICD-10-CM | POA: Diagnosis not present

## 2023-11-04 DIAGNOSIS — N1831 Chronic kidney disease, stage 3a: Secondary | ICD-10-CM | POA: Insufficient documentation

## 2023-11-04 DIAGNOSIS — R600 Localized edema: Secondary | ICD-10-CM | POA: Diagnosis not present

## 2023-11-04 DIAGNOSIS — H9193 Unspecified hearing loss, bilateral: Secondary | ICD-10-CM | POA: Insufficient documentation

## 2023-11-04 DIAGNOSIS — L719 Rosacea, unspecified: Secondary | ICD-10-CM | POA: Insufficient documentation

## 2023-11-04 LAB — CBC
HCT: 41.1 % (ref 39.0–52.0)
Hemoglobin: 13.3 g/dL (ref 13.0–17.0)
MCHC: 32.4 g/dL (ref 30.0–36.0)
MCV: 95.4 fl (ref 78.0–100.0)
Platelets: 106 K/uL — ABNORMAL LOW (ref 150.0–400.0)
RBC: 4.31 Mil/uL (ref 4.22–5.81)
RDW: 13.3 % (ref 11.5–15.5)
WBC: 7.6 K/uL (ref 4.0–10.5)

## 2023-11-04 LAB — LIPID PANEL
Cholesterol: 107 mg/dL (ref 0–200)
HDL: 49.3 mg/dL (ref >39.00)
LDL Cholesterol: 44 mg/dL (ref 0–99)
NonHDL: 57.58
Total CHOL/HDL Ratio: 2
Triglycerides: 66 mg/dL (ref 0.0–149.0)
VLDL: 13.2 mg/dL (ref 0.0–40.0)

## 2023-11-04 LAB — COMPREHENSIVE METABOLIC PANEL WITH GFR
ALT: 27 U/L (ref 0–53)
AST: 43 U/L — ABNORMAL HIGH (ref 0–37)
Albumin: 4.2 g/dL (ref 3.5–5.2)
Alkaline Phosphatase: 71 U/L (ref 39–117)
BUN: 27 mg/dL — ABNORMAL HIGH (ref 6–23)
CO2: 28 meq/L (ref 19–32)
Calcium: 9.1 mg/dL (ref 8.4–10.5)
Chloride: 105 meq/L (ref 96–112)
Creatinine, Ser: 1.42 mg/dL (ref 0.40–1.50)
GFR: 45.6 mL/min — ABNORMAL LOW (ref >60.00)
Glucose, Bld: 99 mg/dL (ref 70–99)
Potassium: 4.2 meq/L (ref 3.5–5.1)
Sodium: 140 meq/L (ref 135–145)
Total Bilirubin: 0.7 mg/dL (ref 0.2–1.2)
Total Protein: 6.5 g/dL (ref 6.0–8.3)

## 2023-11-04 LAB — HEMOGLOBIN A1C: Hgb A1c MFr Bld: 6.4 % (ref 4.6–6.5)

## 2023-11-04 MED ORDER — ROSUVASTATIN CALCIUM 40 MG PO TABS: 40.0000 mg | ORAL_TABLET | Freq: Every day | ORAL | 3 refills | Status: AC

## 2023-11-04 MED ORDER — APIXABAN 5 MG PO TABS: 5.0000 mg | ORAL_TABLET | Freq: Two times a day (BID) | ORAL | 3 refills | Status: AC

## 2023-11-04 MED ORDER — FLUOXETINE HCL 40 MG PO CAPS: ORAL_CAPSULE | ORAL | 3 refills | Status: AC

## 2023-11-04 MED ORDER — HYDROCHLOROTHIAZIDE 12.5 MG PO CAPS: 12.5000 mg | ORAL_CAPSULE | Freq: Every day | ORAL | 3 refills | Status: AC

## 2023-11-04 MED ORDER — DOXYCYCLINE HYCLATE 100 MG PO TABS
100.0000 mg | ORAL_TABLET | Freq: Two times a day (BID) | ORAL | 0 refills | Status: AC
Start: 2023-11-04 — End: 2023-11-11

## 2023-11-04 MED ORDER — GABAPENTIN 100 MG PO CAPS: ORAL_CAPSULE | ORAL | 2 refills | Status: AC

## 2023-11-04 MED ORDER — ALLOPURINOL 100 MG PO TABS: 50.0000 mg | ORAL_TABLET | Freq: Every day | ORAL | 1 refills | Status: AC

## 2023-11-04 MED ORDER — ISOSORBIDE MONONITRATE ER 30 MG PO TB24: 30.0000 mg | ORAL_TABLET | Freq: Every day | ORAL | 3 refills | Status: AC

## 2023-11-04 MED ORDER — METRONIDAZOLE 1 % EX GEL: Freq: Every day | CUTANEOUS | 2 refills | Status: AC

## 2023-11-04 MED ORDER — METOPROLOL SUCCINATE ER 25 MG PO TB24: ORAL_TABLET | ORAL | 3 refills | Status: AC

## 2023-11-04 NOTE — Patient Instructions (Addendum)
-   Apply Metronidazole  gel, thin layer on areas in your face where you get redness daily after washing your face.  - Take Doxycycline  100 mg, twice a day for 7 days. Please take it with tall glass of water and do not lay down for 30 min after taking this antibiotic. Please pick up over a counter probiotic daily for 10 days to help reduce antibiotic side effects. If your rash does not improve with this antibiotic I recommend we see you back in the clinic.  - Please keep appointment with dermatologist in November.  - I am referring you to [facility] for right ear pain, hearing aids.

## 2023-11-04 NOTE — Progress Notes (Signed)
 Please let patient/his wife know:  - his CBC shows low but stable platelets. We will continue to moniter this in the future.  - cholesterol, A1c has been stable.  - his kidney function shows he has chronic kidney disease with slightly worsened kidney function from his baseline. He has seen kidney doctor or nephrologist in the past at [facility] in 09/01/2017 Jil, Vale Aurora, MD). I recommend reevaluation by kidney doctor to see if he needs to take medication to help preserve his kidney function. They can call his office or if they want a referral to nephrologist locally.   Thank you,  Luke Shade, MD

## 2023-11-04 NOTE — Assessment & Plan Note (Signed)
 Suspect related to venous insufficiency.  Encouraged adherence with compression socks which patient does not always like to wear. Regular exercise.

## 2023-11-04 NOTE — Assessment & Plan Note (Signed)
 Chronic issue.  Adequately controlled.  Patient will continue metoprolol  25 mg daily, Imdur  30 mg daily, and HCTZ 12.5 mg daily. Refill sent. Check CMP today.

## 2023-11-04 NOTE — Assessment & Plan Note (Signed)
Check A1c.  He will remain active and monitor his diet.

## 2023-11-04 NOTE — Progress Notes (Signed)
 Established Patient Office Visit TOC from Dr. Maribeth   Subjective  Patient ID: Blake Osborne, male    DOB: [birth year]  Age: 83 y.o. 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  HPI Discussed the use of AI scribe software for clinical note transcription with the patient, who gave verbal consent to proceed.  History of Present Illness Blake Osborne is an 83 year old male with profound hearing loss who presents with his wife and daughter to establish care.   - He has profound hearing loss, which has been worsening over time. 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 Despite trying different hearing aids, he continues to experience significant impairment. He experiences ear pain in the right ear. No discharge is noted from the ears. Does not have ENT.   - He has a history of a brain bleed, epilepsy. He takes Keppra  and is established with [facility] neurology. He is also on Gabapentin  200 mg, twice a day.   - He has a history of low platelet count, no bleeding.   - He also has significant cardiac history including complete heart block, a fib, CAD (s/p stent, CABG), HTN follows with [facility] cardiology but is getting all his cardiac medications refilled by his previous PCP. Reporting it is hard for them to reach out to cardiology clinic and is more convenient to reach out to our clinic rather than the cardiology clinic.  He has a pacemaker. He takes Eliquis  twice daily for atrial fibrillation and rosuvastatin   daily. He denies any recent chest pain and has not needed nitroglycerin. On Metoprolol  Xl 25 mg daily, Hydrochlorothiazide  12.5 mg daily.   - He also takes Prozac  40 mg for mood disorder associated with epilepsy. No SI/HI.   - He takes allopurinol  as needed for gout, which he experiences about twice a year, often triggered by red meat or seafood.  - He reports a rash on his legs, described as dry skin with crusty spots that become crusty and fall off. He has not used Aveeno recently, which he finds helpful for his skin. No itchiness or burning associated with the rash. He has a history of rosacea. He has used triamcinolone  cream in the past. He also uses metronidazole  for rosacea. He has appointment with dermatologist in November 2025.   - He has a history of seasonal allergies, for which he takes Claritin as needed. He denies using nasal sprays due to discomfort. No recent changes in detergent or clothing that could explain the rash.  - He is allergic to amoxicillin . He has a dermatology appointment scheduled for November 25th.  ROS As per HPI    Objective:     BP 118/70 (BP Location: Right Arm, Patient Position: Sitting, Cuff Size: Normal)   Pulse 86   Temp 98.6 F (37 C) (Oral)   Ht 6' 1 (1.854 m)   Wt 218 lb 12.8 oz (99.2 kg)   SpO2 93%   BMI 28.87 kg/m      11/04/2023   10:14 AM 08/09/2022    9:59 AM 02/08/2022    9:37 AM  Depression screen PHQ 2/9  Decreased  Interest 0 0 0  Down, Depressed, Hopeless 0 0 0  PHQ - 2 Score 0 0 0  Altered sleeping 2    Tired, decreased energy 2    Change in appetite 0    Feeling bad or failure about yourself  0    Trouble concentrating 0    Moving slowly or fidgety/restless 0    Suicidal thoughts 0    PHQ-9 Score 4    Difficult doing work/chores Somewhat difficult        11/04/2023   10:15 AM  GAD 7 : Generalized Anxiety Score  Nervous, Anxious, on Edge 0  Control/stop worrying 0  Worry too much - different things 0  Trouble  relaxing 0  Restless 0  Easily annoyed or irritable 0  Afraid - awful might happen 0  Total GAD 7 Score 0  Anxiety Difficulty Not difficult at all      11/04/2023   10:14 AM 08/09/2022    9:59 AM 02/08/2022    9:37 AM  Depression screen PHQ 2/9  Decreased Interest 0 0 0  Down, Depressed, Hopeless 0 0 0  PHQ - 2 Score 0 0 0  Altered sleeping 2    Tired, decreased energy 2    Change in appetite 0    Feeling bad or failure about yourself  0    Trouble concentrating 0    Moving slowly or fidgety/restless 0    Suicidal thoughts 0    PHQ-9 Score 4    Difficult doing work/chores Somewhat difficult        11/04/2023   10:15 AM  GAD 7 : Generalized Anxiety Score  Nervous, Anxious, on Edge 0  Control/stop worrying 0  Worry too much - different things 0  Trouble relaxing 0  Restless 0  Easily annoyed or irritable 0  Afraid - awful might happen 0  Total GAD 7 Score 0  Anxiety Difficulty Not difficult at all   SDOH Screenings   Food Insecurity: No Food Insecurity (08/05/2022)  Housing: Unknown (05/21/2023)   Received from [facility]  Transportation Needs: Unknown (08/05/2022)  Utilities: Not At Risk (08/05/2022)  Depression (PHQ2-9): Low Risk  (11/04/2023)  Financial Resource Strain: Low Risk  (08/05/2022)  Physical Activity: Insufficiently Active (08/05/2022)  Social Connections: Unknown (08/05/2022)  Stress: No Stress Concern Present (08/05/2022)  Tobacco Use: Medium Risk (11/04/2023)     Physical Exam HENT:     Head: Normocephalic.     Right Ear: Ear canal normal. Tympanic membrane is not erythematous or bulging.     Left Ear: Ear canal normal. Tympanic membrane is not erythematous or bulging.     Ears:     Comments: B/L hearing aids noted.  Cardiovascular:     Rate and Rhythm: Normal rate.  Pulmonary:     Breath sounds: No wheezing.  Abdominal:     Palpations: Abdomen is soft.     Tenderness: There is no abdominal tenderness.  Musculoskeletal:      Cervical back: Normal range of motion. No rigidity.     Right lower leg: Edema (1+ pitting) present.     Left lower leg: Edema (1+ pitting) present.  Skin:    Findings: Lesion (small, pink bumps on b/l anterior thigh, left worse than right without burning, itching) present.  Neurological:     Mental Status: He is alert.           No results found for any visits on 11/04/23.  The ASCVD Risk  score (Arnett DK, et al., 2019) failed to calculate for the following reasons:   The 2019 ASCVD risk score is only valid for ages 56 to 73   Risk score cannot be calculated because patient has a medical history suggesting prior/existing ASCVD     Assessment & Plan:   Folliculitis Assessment & Plan: Involving b/l anterior thigh. Take Doxycycline  100 mg, twice a day for 7 days. Please take it with tall glass of water and do not lay down for 30 min after taking this antibiotic. Please pick up over a counter probiotic daily for 10 days to help reduce antibiotic side effects. If your rash does not improve with this antibiotic I recommend we see you back in the clinic.   Orders: -     Doxycycline  Hyclate; Take 1 tablet (100 mg total) by mouth 2 (two) times daily for 7 days.  Dispense: 14 tablet; Refill: 0  Gout, unspecified cause, unspecified chronicity, unspecified site Assessment & Plan: Chronic issue.  No recent flares.  Seems to be controlled by dietary changes and Allopurinol  50 mg daily.   Orders: -     Allopurinol ; Take 0.5 tablets (50 mg total) by mouth daily.  Dispense: 45 tablet; Refill: 1  Dermatitis Assessment & Plan: Involving b/l anterior thigh. Take Doxycycline  100 mg, twice a day for 7 days. Please take it with tall glass of water and do not lay down for 30 min after taking this antibiotic. Please pick up over a counter probiotic daily for 10 days to help reduce antibiotic side effects. If your rash does not improve with this antibiotic I recommend we see you back in the clinic.     Primary hypertension Assessment & Plan: Chronic issue.  Adequately controlled.  Patient will continue metoprolol  25 mg daily, Imdur  30 mg daily, and HCTZ 12.5 mg daily. Refill sent. Check CMP today.   Orders: -     hydroCHLOROthiazide ; Take 1 capsule (12.5 mg total) by mouth daily.  Dispense: 90 capsule; Refill: 3 -     Comprehensive metabolic panel with GFR -     Metoprolol  Succinate ER; TAKE 1 TABLET(25 MG) BY MOUTH DAILY  Dispense: 90 tablet; Refill: 3 -     Isosorbide  Mononitrate ER; Take 1 tablet (30 mg total) by mouth daily.  Dispense: 90 tablet; Refill: 3  Bilateral lower extremity edema Assessment & Plan: Suspect related to venous insufficiency.  Encouraged adherence with compression socks which patient does not always like to wear. Regular exercise.   Orders: -     CBC  Prediabetes Assessment & Plan: Check A1c.  He will remain active and monitor his diet.  Orders: -     Hemoglobin A1c  Paroxysmal atrial fibrillation (HCC) Assessment & Plan: Continue Eliquis  5 mg twice daily and metoprolol  25 mg daily.  Advised if he has any prolonged bleeding episodes greater than 10 minutes he needs to seek medical attention. Refill sent.   Orders: -     Apixaban ; Take 1 tablet (5 mg total) by mouth 2 (two) times daily.  Dispense: 180 tablet; Refill: 3  Seizure disorder [facility]) Assessment & Plan: He will continue management through neurology at [facility], on Keppra . Current rash on thigh could be related to Keppra . If rash does not resolve with antibiotic recommend reevaluation in the clinic and updating his neurologist on the rash as well. Also on Gabapentin  100 mg (2 capsules BID). Refill sent.   Orders: -     Gabapentin ; TAKE 2 CAPSULES(200 MG) BY  MOUTH TWICE DAILY  Dispense: 360 capsule; Refill: 2  Mood disorder (HCC) Assessment & Plan: Stable on Fluoxetine  40 mg daily, no SI/HI. Continue, refill sent.   Orders: -     FLUoxetine  HCl; TAKE 1 CAPSULE(40 MG) BY MOUTH DAILY   Dispense: 90 capsule; Refill: 3  Cardiac pacemaker in situ  Mixed hyperlipidemia Assessment & Plan: Chronic. Continue Crestor  40 mg once daily.  Check lipid panel.   Orders: -     Rosuvastatin  Calcium ; Take 1 tablet (40 mg total) by mouth daily.  Dispense: 90 tablet; Refill: 3 -     Lipid panel  Thrombocytopenia (HCC) Assessment & Plan: Previous work-up for this was negative. Repeat CBC. Recommend hematology referral if continues to be on low.   Orders: -     CBC  Rosacea Assessment & Plan: Involving face, recommend Metronidazole  gel, 1%, daily on affected skin. Use gentle skin care Cleansers (Cetaphil, Cerave, Neutrogena). Moisturizers (Vaseline, Cetaphil, CeraVe, Neutrogena). Has upcoming appointment with dermatologist in November, encourage patient f/u with them.   Orders: -     metroNIDAZOLE ; Apply topically daily.  Dispense: 45 g; Refill: 2  Bilateral hearing loss, unspecified hearing loss type Assessment & Plan: Chronic with right ear pain. Exam of b/l ears reassuring, suspect skin irritation from hearing aids causing pain to the right ear. Recommend ENT evaluation. Referral to [facility] made.   Orders: -     Ambulatory referral to ENT  Coronary arteriosclerosis Assessment & Plan: Continue Rosuvastatin  40 mg daily.    I spent 50 minutes on the day of this face-to-face encounter reviewing the patient's medical and surgical history, medications, ongoing concerns, and reviewing the assessment and plan with the patient. This time also included counseling the patient on their health conditions and management options. Additionally, I spent time post-visit ordering and reviewing diagnostics and therapeutics with the patient.  Return in about 6 months (around 05/06/2024) for Chronic follow up .   Luke Shade, MD

## 2023-11-04 NOTE — Assessment & Plan Note (Signed)
 Chronic. Continue Crestor  40 mg once daily.  Check lipid panel.

## 2023-11-04 NOTE — Assessment & Plan Note (Signed)
 Previous work-up for this was negative. Repeat CBC. Recommend hematology referral if continues to be on low.

## 2023-11-04 NOTE — Assessment & Plan Note (Signed)
 Chronic issue.  No recent flares.  Seems to be controlled by dietary changes and Allopurinol  50 mg daily.

## 2023-11-04 NOTE — Assessment & Plan Note (Signed)
 Involving b/l anterior thigh. Take Doxycycline  100 mg, twice a day for 7 days. Please take it with tall glass of water and do not lay down for 30 min after taking this antibiotic. Please pick up over a counter probiotic daily for 10 days to help reduce antibiotic side effects. If your rash does not improve with this antibiotic I recommend we see you back in the clinic.

## 2023-11-04 NOTE — Assessment & Plan Note (Addendum)
 Involving face, recommend Metronidazole  gel, 1%, daily on affected skin. Use gentle skin care Cleansers (Cetaphil, Cerave, Neutrogena). Moisturizers (Vaseline, Cetaphil, CeraVe, Neutrogena). Has upcoming appointment with dermatologist in November, encourage patient f/u with them.

## 2023-11-04 NOTE — Assessment & Plan Note (Signed)
 Chronic with right ear pain. Exam of b/l ears reassuring, suspect skin irritation from hearing aids causing pain to the right ear. Recommend ENT evaluation. Referral to [facility] made.

## 2023-11-04 NOTE — Assessment & Plan Note (Signed)
 Stable on Fluoxetine  40 mg daily, no SI/HI. Continue, refill sent.

## 2023-11-04 NOTE — Assessment & Plan Note (Signed)
-  Continue Rosuvastatin '40mg'$  daily

## 2023-11-04 NOTE — Telephone Encounter (Signed)
 1. Stage 3a chronic kidney disease (HCC) (Primary) - Ambulatory referral to Nephrology  Luke Shade, MD

## 2023-11-04 NOTE — Assessment & Plan Note (Signed)
 Continue Eliquis  5 mg twice daily and metoprolol  25 mg daily.  Advised if he has any prolonged bleeding episodes greater than 10 minutes he needs to seek medical attention. Refill sent.

## 2023-11-04 NOTE — Assessment & Plan Note (Addendum)
 He will continue management through neurology at [facility], on Keppra . Current rash on thigh could be related to Keppra . If rash does not resolve with antibiotic recommend reevaluation in the clinic and updating his neurologist on the rash as well. Also on Gabapentin  100 mg (2 capsules BID). Refill sent.

## 2023-11-11 ENCOUNTER — Ambulatory Visit (INDEPENDENT_AMBULATORY_CARE_PROVIDER_SITE_OTHER): Admitting: *Deleted

## 2023-11-11 VITALS — Ht 73.0 in | Wt 218.0 lb

## 2023-11-11 DIAGNOSIS — Z0000 Encounter for general adult medical examination without abnormal findings: Secondary | ICD-10-CM

## 2023-11-11 NOTE — Patient Instructions (Signed)
 Mr. Selvidge , Thank you for taking time out of your busy schedule to complete your Annual Wellness Visit with me. I enjoyed our conversation and look forward to speaking with you again next year. I, as well as your care team,  appreciate your ongoing commitment to your health goals. Please review the following plan we discussed and let me know if I can assist you in the future. Your Game plan/ To Do List    Referrals: If you haven't heard from the office you've been referred to, please reach out to them at the phone provided.  Remember to update your Tetanus (Tdap) vaccine at the pharmacy and get your flu and covid vaccines annually Follow up Visits: Next Medicare AWV with our clinical staff: 11/15/24 @ 10:50   Have you seen your provider in the last 6 months (3 months if uncontrolled diabetes)? Yes Next Office Visit with your provider: 05/06/24  Clinician Recommendations:  Aim for 30 minutes of exercise or brisk walking, 6-8 glasses of water, and 5 servings of fruits and vegetables each day.       This is a list of the screening recommended for you and due dates:  Health Maintenance  Topic Date Due   DTaP/Tdap/Td vaccine (1 - Tdap) Never done   COVID-19 Vaccine (8 - 2024-25 season) 12/15/2022   Flu Shot  11/14/2023   Medicare Annual Wellness Visit  11/10/2024   Pneumococcal Vaccine for age over 57  Completed   Zoster (Shingles) Vaccine  Completed   Hepatitis B Vaccine  Aged Out   HPV Vaccine  Aged Out   Meningitis B Vaccine  Aged Out   Hepatitis C Screening  Discontinued    Advanced directives: (Copy Requested) Please bring a copy of your health care power of attorney and living will to the office to be added to your chart at your convenience. You can mail to [facility] [address] [place], [place] [address] or email to [email or web address] Advance Care Planning is important because it:  [x]  Makes sure you receive the medical care that is consistent with your  values, goals, and preferences  [x]  It provides guidance to your family and loved ones and reduces their decisional burden about whether or not they are making the right decisions based on your wishes.

## 2023-11-11 NOTE — Progress Notes (Signed)
 Subjective:   Blake Osborne is a 83 y.o. who presents for a Medicare Wellness preventive visit.  As a reminder, Annual Wellness Visits don't include a physical exam, and some assessments may be limited, especially if this visit is performed virtually. We may recommend an in-person follow-up visit with your provider if needed.  Visit Complete: Virtual I connected with  Blake Osborne on 11/11/23 by a audio enabled telemedicine application and verified that I am speaking with the correct person using two identifiers.  Patient Location: Home  Provider Location: Home Office  I discussed the limitations of evaluation and management by telemedicine. The patient expressed understanding and agreed to proceed.  Vital Signs: Because this visit was a virtual/telehealth visit, some criteria may be missing or patient reported. Any vitals not documented were not able to be obtained and vitals that have been documented are patient reported.  VideoDeclined- This patient declined [name], academic. Therefore the visit was completed with audio only.  Persons Participating in Visit: Patient assisted by wife Blake Osborne.  AWV Questionnaire: No: Patient Medicare AWV questionnaire was not completed prior to this visit.  Cardiac Risk Factors include: advanced age (>77men, >55 women);male gender;hypertension;dyslipidemia;Other (see comment), Risk factor comments: Pacemaker     Objective:    Today's Vitals   11/11/23 1134  Weight: 218 lb (98.9 kg)  Height: 6' 1 (1.854 m)   Body mass index is 28.76 kg/m.     11/11/2023   11:51 AM 08/09/2022   10:00 AM 04/07/2022    9:01 AM 07/31/2021    9:50 AM 10/15/2019    9:09 AM 10/01/2019   10:54 AM 09/06/2019    9:12 AM  Advanced Directives  Does Patient Have a Medical Advance Directive? Yes Yes No Yes Yes Yes Yes  Type of Estate agent of [place];Living will Healthcare Power of  [place];Living will  Healthcare Power of [place];Living will Living will;Healthcare Power of Attorney Living will;Healthcare Power of [employer] Power of [place];Living will  Does patient want to make changes to medical advance directive?  No - Patient declined  No - Patient declined     Copy of Healthcare Power of Attorney in Chart? No - copy requested No - copy requested  No - copy requested   No - copy requested    Current Medications (verified) Outpatient Encounter Medications as of 11/11/2023  Medication Sig   allopurinol  (ZYLOPRIM ) 100 MG tablet Take 0.5 tablets (50 mg total) by mouth daily. (Patient taking differently: Take 50 mg by mouth daily as needed.)   apixaban  (ELIQUIS ) 5 MG TABS tablet Take 1 tablet (5 mg total) by mouth 2 (two) times daily.   doxycycline  (VIBRA -TABS) 100 MG tablet Take 1 tablet (100 mg total) by mouth 2 (two) times daily for 7 days.   FLUoxetine  (PROZAC ) 40 MG capsule TAKE 1 CAPSULE(40 MG) BY MOUTH DAILY   gabapentin  (NEURONTIN ) 100 MG capsule TAKE 2 CAPSULES(200 MG) BY MOUTH TWICE DAILY   hydrochlorothiazide  (MICROZIDE ) 12.5 MG capsule Take 1 capsule (12.5 mg total) by mouth daily.   isosorbide  mononitrate (IMDUR ) 30 MG 24 hr tablet Take 1 tablet (30 mg total) by mouth daily.   levETIRAcetam  (KEPPRA  XR) 500 MG 24 hr tablet Take 1,000-1,500 mg by mouth 2 (two) times daily. Take 2 tablets by mouth in the am and 3 tablets by mouth in the pm   loratadine (CLARITIN REDITABS) 10 MG dissolvable tablet Take 10 mg by mouth daily as needed for allergies.  metoprolol  succinate (TOPROL -XL) 25 MG 24 hr tablet TAKE 1 TABLET(25 MG) BY MOUTH DAILY   metroNIDAZOLE  (METROGEL ) 1 % gel Apply topically daily.   rosuvastatin  (CRESTOR ) 40 MG tablet Take 1 tablet (40 mg total) by mouth daily.   No facility-administered encounter medications on file as of 11/11/2023.    Allergies (verified) Bee pollen, Dust mite extract, Other, Pollen extract, Amoxicillin -pot clavulanate,  and Atorvastatin calcium    History: Past Medical History:  Diagnosis Date   Atrial fibrillation (HCC)    Coronary arteriosclerosis due to lipid rich plaque    s/p [facility]G [facility]   Head injury 05/29/2018   History of stroke 03/18/2017   Hyperlipidemia    Hypertension    Left BB block NEC    Lightheadedness 06/18/2017   Obesity (BMI 30-39.9) 11/19/2013   Pacemaker    Proteinuria 06/18/2017   Thrombocytopenia (HCC)    Trochanteric bursitis of left hip 08/20/2017   Past Surgical History:  Procedure Laterality Date   CARDIAC SURGERY     CORONARY ARTERY BYPASS GRAFT     PACEMAKER GENERATOR CHANGE     Family History  Problem Relation Age of Onset   Heart disease Father    Heart attack Father    Aneurysm Brother        heart   Social History   Socioeconomic History   Marital status: Married    Spouse name: Not on file   Number of children: Not on file   Years of education: Not on file   Highest education level: Not on file  Occupational History   Not on file  Tobacco Use   Smoking status: Former    Current packs/day: 0.00    Types: Cigarettes    Start date: 10/01/1951    Quit date: 10/01/1959    Years since quitting: 64.1   Smokeless tobacco: Never  Vaping Use   Vaping status: Never Used  Substance and Sexual Activity   Alcohol  use: Yes    Alcohol /week: 1.0 standard drink of alcohol     Types: 1 Glasses of wine per week    Comment: occasional glass of wine   Drug use: No   Sexual activity: Not on file  Other Topics Concern   Not on file  Social History Narrative   married   Social Drivers of Corporate investment banker Strain: Low Risk  (11/11/2023)   Overall Financial Resource Strain (CARDIA)    Difficulty of Paying Living Expenses: Not hard at all  Food Insecurity: No Food Insecurity (11/11/2023)   Hunger Vital Sign    Worried About Running Out of Food in the Last Year: Never true    Ran Out of Food in the Last Year: Never true   Transportation Needs: No Transportation Needs (11/11/2023)   PRAPARE - Administrator, Civil Service (Medical): No    Lack of Transportation (Non-Medical): No  Physical Activity: Inactive (11/11/2023)   Exercise Vital Sign    Days of Exercise per Week: 0 days    Minutes of Exercise per Session: 0 min  Stress: No Stress Concern Present (11/11/2023)   [employer] of Occupational Health - Occupational Stress Questionnaire    Feeling of Stress: Only a little  Social Connections: Moderately Isolated (11/11/2023)   Social Connection and Isolation Panel    Frequency of Communication with Friends and Family: More than three times a week    Frequency of Social Gatherings with Friends and Family: More than three times  a week    Attends Religious Services: Never    Active Member of Clubs or Organizations: No    Attends Banker Meetings: Never    Marital Status: Married    Tobacco Counseling Counseling given: Not Answered    Clinical Intake:  Pre-visit preparation completed: Yes  Pain : No/denies pain     BMI - recorded: 28.76 Nutritional Status: BMI 25 -29 Overweight Nutritional Risks: Nausea/ vomitting/ diarrhea (took medication on empty stomach) Diabetes: No  Lab Results  Component Value Date   HGBA1C 6.4 11/04/2023   HGBA1C 6.1 03/05/2023   HGBA1C 5.8 (A) 02/08/2022     How often do you need to have someone help you when you read instructions, pamphlets, or other written materials from your doctor or pharmacy?: 1 - Never  Interpreter Needed?: No  Information entered by :: R. Vincent Ehrler LPN   Activities of Daily Living     11/11/2023   11:37 AM  In your present state of health, do you have any difficulty performing the following activities:  Hearing? 1  Vision? 0  Difficulty concentrating or making decisions? 1  Walking or climbing stairs? 0  Dressing or bathing? 0  Doing errands, shopping? 1  Preparing Food and eating ? N  Using the  Toilet? N  In the past six months, have you accidently leaked urine? Y  Do you have problems with loss of bowel control? N  Managing your Medications? Y  Managing your Finances? Y  Housekeeping or managing your Housekeeping? N    Patient Care Team: Bair, Kalpana, MD as PCP - General (Family Medicine)  I have updated your Care Teams any recent Medical Services you may have received from other providers in the past year.     Assessment:   This is a routine wellness examination for Arlan.  Hearing/Vision screen Hearing Screening - Comments:: Wears aids Vision Screening - Comments:: No correction   Goals Addressed             This Visit's Progress    Patient Stated       Wants to try to drink more water       Depression Screen     11/11/2023   11:48 AM 11/04/2023   10:14 AM 08/09/2022    9:59 AM 02/08/2022    9:37 AM 07/31/2021    9:41 AM 07/18/2021   11:40 AM 06/27/2020   11:03 AM  PHQ 2/9 Scores  PHQ - 2 Score 0 0 0 0 0 0 0  PHQ- 9 Score 4 4         Fall Risk     11/11/2023   11:41 AM 11/04/2023   10:14 AM 08/05/2022    6:20 PM 02/08/2022    9:37 AM 07/31/2021   10:02 AM  Fall Risk   Falls in the past year? 0 1 0 0 0  Number falls in past yr: 0 0 0 0   Injury with Fall? 0 0 0 0   Risk for fall due to : No Fall Risks History of fall(s)  No Fall Risks   Follow up Falls evaluation completed;Falls prevention discussed Falls evaluation completed Falls evaluation completed;Falls prevention discussed Falls evaluation completed  Falls evaluation completed      Data saved with a previous flowsheet row definition    MEDICARE RISK AT HOME:  Medicare Risk at Home Any stairs in or around the home?: Yes If so, are there any without handrails?: Yes Home free of  loose throw rugs in walkways, pet beds, electrical cords, etc?: Yes Adequate lighting in your home to reduce risk of falls?: Yes Life alert?: No Use of a cane, walker or w/c?: No Grab bars in the bathroom?:  Yes Shower chair or bench in shower?: Yes Elevated toilet seat or a handicapped toilet?: Yes  TIMED UP AND GO:  Was the test performed?  No  Cognitive Function: 6CIT completed    09/06/2019    9:16 AM 10/07/2016    9:39 AM  MMSE - Mini Mental State Exam  Not completed: Unable to complete   Orientation to time  5   Orientation to Place  5   Registration  3   Attention/ Calculation  5   Recall  3   Language- name 2 objects  2   Language- repeat  1  Language- follow 3 step command  3   Language- read & follow direction  1   Write a sentence  1   Copy design  1   Total score  30      Data saved with a previous flowsheet row definition        11/11/2023   11:52 AM 08/09/2022   10:04 AM 09/03/2018   10:24 AM  6CIT Screen  What Year? 0 points 0 points 0 points  What month? 0 points 0 points 0 points  What time? 0 points 0 points 0 points  Count back from 20 0 points  0 points  Months in reverse 0 points  0 points  Repeat phrase 2 points  0 points  Total Score 2 points  0 points    Immunizations Immunization History  Administered Date(s) Administered    sv, Bivalent, Protein Subunit Rsvpref,pf [place]) 01/04/2022   Fluad Quad(high Dose 65+) 01/04/2022   Influenza Split 01/14/2011, 01/16/2012, 02/08/2014   Influenza, High Dose Seasonal PF 01/05/2019   Influenza-Unspecified 03/15/2015, 01/01/2018, 12/25/2022   Moderna Covid-19 Fall Seasonal Vaccine 30yrs & older 01/14/2022   Moderna Covid-19 Vaccine  Bivalent Booster 8yrs & up 12/22/2020, 08/15/2021   Moderna Sars-Covid-2 Vaccination 11/30/2019   PFIZER Comirnaty(Gray Top)Covid-19 Tri-Sucrose Vaccine 08/10/2020   PFIZER(Purple Top)SARS-COV-2 Vaccination 05/07/2019, 05/28/2019   Pfizer Covid-19 Vaccine Bivalent Booster 59yrs & up 11/30/2020   Pfizer(Comirnaty)Fall Seasonal Vaccine 12 years and older 01/14/2022   Pneumococcal Conjugate-13 02/15/2014   Pneumococcal Polysaccharide-23 06/15/2008   Respiratory Syncytial  Virus Vaccine,Recomb Aduvanted(Arexvy) 01/04/2022   Zoster Recombinant(Shingrix) 01/01/2021, 01/14/2022   Zoster, Live 06/16/2010    Screening Tests Health Maintenance  Topic Date Due   DTaP/Tdap/Td (1 - Tdap) Never done   COVID-19 Vaccine (8 - 2024-25 season) 12/15/2022   Medicare Annual Wellness (AWV)  08/09/2023   INFLUENZA VACCINE  11/14/2023   Pneumococcal Vaccine: 50+ Years  Completed   Zoster Vaccines- Shingrix  Completed   Hepatitis B Vaccines  Aged Out   HPV VACCINES  Aged Out   Meningococcal B Vaccine  Aged Out   Hepatitis C Screening  Discontinued    Health Maintenance  Health Maintenance Due  Topic Date Due   DTaP/Tdap/Td (1 - Tdap) Never done   COVID-19 Vaccine (8 - 2024-25 season) 12/15/2022   Medicare Annual Wellness (AWV)  08/09/2023   Health Maintenance Items Addressed: Discussed the need to update tetanus (Tdap) vaccine and to get a flu and covid vaccine annually  Additional Screening:  Vision Screening: Recommended annual ophthalmology exams for early detection of glaucoma and other disorders of the eye.  Up to date  [facility] Would  you like a referral to an eye doctor? No    Dental Screening: Recommended annual dental exams for proper oral hygiene  Community Resource Referral / Chronic Care Management: CRR required this visit?  No   CCM required this visit?  No   Plan:    I have personally reviewed and noted the following in the patient's chart:   Medical and social history Use of alcohol , tobacco or illicit drugs  Current medications and supplements including opioid prescriptions. Patient is not currently taking opioid prescriptions. Functional ability and status Nutritional status Physical activity Advanced directives List of other physicians Hospitalizations, surgeries, and ER visits in previous 12 months Vitals Screenings to include cognitive, depression, and falls Referrals and appointments  In addition, I have reviewed and  discussed with patient certain preventive protocols, quality metrics, and best practice recommendations. A written personalized care plan for preventive services as well as general preventive health recommendations were provided to patient.   Angeline Fredericks, LPN   [id number]/[id number]   After Visit Summary: (MyChart) Due to this being a telephonic visit, the after visit summary with patients personalized plan was offered to patient via MyChart   Notes: Nothing significant to report at this time.

## 2023-11-25 DIAGNOSIS — H527 Unspecified disorder of refraction: Secondary | ICD-10-CM | POA: Diagnosis not present

## 2023-11-25 DIAGNOSIS — Z961 Presence of intraocular lens: Secondary | ICD-10-CM | POA: Diagnosis not present

## 2023-12-17 ENCOUNTER — Telehealth: Payer: Self-pay

## 2023-12-17 NOTE — Telephone Encounter (Signed)
 Copied from CRM #[id number]. Topic: Clinical - Medication Question >> Dec 17, 2023  3:11 PM Deaijah H wrote: Reason for CRM: Val w/ [facility] called in to follow up on faxed script from 12/08/23 @5 :43pm gabapentin  (NEURONTIN ) 100 MG capsule/isosorbide  mononitrate (IMDUR ) 30 MG 24 hr tablet.

## 2023-12-17 NOTE — Telephone Encounter (Signed)
 Return to call to [facility] in regards to previous telephone encounter. Pharmacy technician states she is not sure why they reached out in regards of patient's prescriptions. Both of the prescription were received and they will be ready for pick after Friday at 11 am.

## 2023-12-20 DIAGNOSIS — Z23 Encounter for immunization: Secondary | ICD-10-CM | POA: Diagnosis not present

## 2023-12-26 ENCOUNTER — Other Ambulatory Visit: Payer: Self-pay

## 2023-12-26 DIAGNOSIS — Z23 Encounter for immunization: Secondary | ICD-10-CM

## 2023-12-26 MED ORDER — COVID-19 MRNA VACC (MODERNA) 50 MCG/0.5ML IM SUSP: 0.5000 mL | Freq: Once | INTRAMUSCULAR | 0 refills | Status: AC

## 2023-12-26 NOTE — Telephone Encounter (Signed)
 Copied from CRM [id number]. Topic: Clinical - Medication Question >> Dec 26, 2023  9:33 AM Deleta RAMAN wrote: Reason for CRM: patient requesting prescription for covid vaccine please send to [facility]

## 2023-12-27 DIAGNOSIS — Z23 Encounter for immunization: Secondary | ICD-10-CM | POA: Diagnosis not present

## 2024-01-07 DIAGNOSIS — I4430 Unspecified atrioventricular block: Secondary | ICD-10-CM | POA: Diagnosis not present

## 2024-01-07 DIAGNOSIS — Z950 Presence of cardiac pacemaker: Secondary | ICD-10-CM | POA: Diagnosis not present

## 2024-01-07 DIAGNOSIS — I4811 Longstanding persistent atrial fibrillation: Secondary | ICD-10-CM | POA: Diagnosis not present

## 2024-01-07 DIAGNOSIS — Z951 Presence of aortocoronary bypass graft: Secondary | ICD-10-CM | POA: Diagnosis not present

## 2024-01-07 DIAGNOSIS — I10 Essential (primary) hypertension: Secondary | ICD-10-CM | POA: Diagnosis not present

## 2024-01-07 DIAGNOSIS — Z7901 Long term (current) use of anticoagulants: Secondary | ICD-10-CM | POA: Diagnosis not present

## 2024-01-07 DIAGNOSIS — I2510 Atherosclerotic heart disease of native coronary artery without angina pectoris: Secondary | ICD-10-CM | POA: Diagnosis not present

## 2024-01-09 DIAGNOSIS — M5416 Radiculopathy, lumbar region: Secondary | ICD-10-CM | POA: Diagnosis not present

## 2024-01-09 DIAGNOSIS — M7061 Trochanteric bursitis, right hip: Secondary | ICD-10-CM | POA: Diagnosis not present

## 2024-02-24 DIAGNOSIS — G40109 Localization-related (focal) (partial) symptomatic epilepsy and epileptic syndromes with simple partial seizures, not intractable, without status epilepticus: Secondary | ICD-10-CM | POA: Diagnosis not present

## 2024-02-24 DIAGNOSIS — I6930 Unspecified sequelae of cerebral infarction: Secondary | ICD-10-CM | POA: Diagnosis not present

## 2024-02-24 DIAGNOSIS — M792 Neuralgia and neuritis, unspecified: Secondary | ICD-10-CM | POA: Diagnosis not present

## 2024-02-26 DIAGNOSIS — Z961 Presence of intraocular lens: Secondary | ICD-10-CM | POA: Diagnosis not present

## 2024-03-12 ENCOUNTER — Ambulatory Visit (INDEPENDENT_AMBULATORY_CARE_PROVIDER_SITE_OTHER)

## 2024-03-12 ENCOUNTER — Encounter: Payer: Self-pay | Admitting: Emergency Medicine

## 2024-03-12 ENCOUNTER — Ambulatory Visit
Admission: EM | Admit: 2024-03-12 | Discharge: 2024-03-12 | Disposition: A | Discharge status: Home / Self Care | Attending: Emergency Medicine | Admitting: Emergency Medicine

## 2024-03-12 DIAGNOSIS — I2510 Atherosclerotic heart disease of native coronary artery without angina pectoris: Secondary | ICD-10-CM | POA: Diagnosis present

## 2024-03-12 DIAGNOSIS — I442 Atrioventricular block, complete: Secondary | ICD-10-CM | POA: Diagnosis present

## 2024-03-12 DIAGNOSIS — I5021 Acute systolic (congestive) heart failure: Secondary | ICD-10-CM | POA: Diagnosis not present

## 2024-03-12 DIAGNOSIS — Z950 Presence of cardiac pacemaker: Secondary | ICD-10-CM | POA: Diagnosis not present

## 2024-03-12 DIAGNOSIS — Z951 Presence of aortocoronary bypass graft: Secondary | ICD-10-CM | POA: Diagnosis not present

## 2024-03-12 DIAGNOSIS — G40109 Localization-related (focal) (partial) symptomatic epilepsy and epileptic syndromes with simple partial seizures, not intractable, without status epilepticus: Secondary | ICD-10-CM | POA: Diagnosis present

## 2024-03-12 DIAGNOSIS — I4819 Other persistent atrial fibrillation: Secondary | ICD-10-CM | POA: Diagnosis present

## 2024-03-12 DIAGNOSIS — I493 Ventricular premature depolarization: Secondary | ICD-10-CM | POA: Diagnosis not present

## 2024-03-12 DIAGNOSIS — I5023 Acute on chronic systolic (congestive) heart failure: Secondary | ICD-10-CM | POA: Diagnosis present

## 2024-03-12 DIAGNOSIS — I361 Nonrheumatic tricuspid (valve) insufficiency: Secondary | ICD-10-CM | POA: Diagnosis not present

## 2024-03-12 DIAGNOSIS — Z79899 Other long term (current) drug therapy: Secondary | ICD-10-CM | POA: Diagnosis not present

## 2024-03-12 DIAGNOSIS — R052 Subacute cough: Secondary | ICD-10-CM

## 2024-03-12 DIAGNOSIS — R0602 Shortness of breath: Secondary | ICD-10-CM | POA: Diagnosis not present

## 2024-03-12 DIAGNOSIS — R0989 Other specified symptoms and signs involving the circulatory and respiratory systems: Secondary | ICD-10-CM | POA: Diagnosis not present

## 2024-03-12 DIAGNOSIS — D472 Monoclonal gammopathy: Secondary | ICD-10-CM | POA: Diagnosis not present

## 2024-03-12 DIAGNOSIS — I130 Hypertensive heart and chronic kidney disease with heart failure and stage 1 through stage 4 chronic kidney disease, or unspecified chronic kidney disease: Secondary | ICD-10-CM | POA: Diagnosis present

## 2024-03-12 DIAGNOSIS — J90 Pleural effusion, not elsewhere classified: Secondary | ICD-10-CM | POA: Diagnosis not present

## 2024-03-12 DIAGNOSIS — I428 Other cardiomyopathies: Secondary | ICD-10-CM | POA: Diagnosis present

## 2024-03-12 DIAGNOSIS — I517 Cardiomegaly: Secondary | ICD-10-CM | POA: Diagnosis not present

## 2024-03-12 DIAGNOSIS — N1832 Chronic kidney disease, stage 3b: Secondary | ICD-10-CM | POA: Diagnosis not present

## 2024-03-12 DIAGNOSIS — M109 Gout, unspecified: Secondary | ICD-10-CM | POA: Diagnosis present

## 2024-03-12 DIAGNOSIS — R4781 Slurred speech: Secondary | ICD-10-CM | POA: Diagnosis present

## 2024-03-12 DIAGNOSIS — Z452 Encounter for adjustment and management of vascular access device: Secondary | ICD-10-CM | POA: Diagnosis not present

## 2024-03-12 DIAGNOSIS — R918 Other nonspecific abnormal finding of lung field: Secondary | ICD-10-CM | POA: Diagnosis not present

## 2024-03-12 DIAGNOSIS — R931 Abnormal findings on diagnostic imaging of heart and coronary circulation: Secondary | ICD-10-CM | POA: Diagnosis not present

## 2024-03-12 DIAGNOSIS — Z7901 Long term (current) use of anticoagulants: Secondary | ICD-10-CM | POA: Diagnosis not present

## 2024-03-12 DIAGNOSIS — N183 Chronic kidney disease, stage 3 unspecified: Secondary | ICD-10-CM | POA: Diagnosis present

## 2024-03-12 DIAGNOSIS — R609 Edema, unspecified: Secondary | ICD-10-CM | POA: Diagnosis not present

## 2024-03-12 DIAGNOSIS — I700 Atherosclerosis of aorta: Secondary | ICD-10-CM | POA: Diagnosis not present

## 2024-03-12 DIAGNOSIS — R9431 Abnormal electrocardiogram [ECG] [EKG]: Secondary | ICD-10-CM | POA: Diagnosis not present

## 2024-03-12 DIAGNOSIS — R059 Cough, unspecified: Secondary | ICD-10-CM | POA: Diagnosis not present

## 2024-03-12 DIAGNOSIS — Z87891 Personal history of nicotine dependence: Secondary | ICD-10-CM | POA: Diagnosis not present

## 2024-03-12 DIAGNOSIS — R079 Chest pain, unspecified: Secondary | ICD-10-CM | POA: Diagnosis not present

## 2024-03-12 DIAGNOSIS — Z8673 Personal history of transient ischemic attack (TIA), and cerebral infarction without residual deficits: Secondary | ICD-10-CM | POA: Diagnosis not present

## 2024-03-12 DIAGNOSIS — Z9889 Other specified postprocedural states: Secondary | ICD-10-CM | POA: Diagnosis not present

## 2024-03-12 DIAGNOSIS — E785 Hyperlipidemia, unspecified: Secondary | ICD-10-CM | POA: Diagnosis present

## 2024-03-12 NOTE — ED Notes (Signed)
 Patient is being discharged from the Urgent Care and sent to the Emergency Department via POV . Per  Venetia Ryan,NP patient is in need of higher level of care due to Pulmonary vascular congestion,. Patient is aware and verbalizes understanding of plan of care.  Vitals:   03/12/24 1340  BP: 125/71  Pulse: 73  Resp: 16  Temp: (!) 97.5 F (36.4 C)  SpO2: 95%

## 2024-03-12 NOTE — ED Triage Notes (Signed)
 Pt c/o cough, and chest tightness. Started about 2 weeks ago.

## 2024-03-12 NOTE — Discharge Instructions (Addendum)
 Please go to the emergency department at [facility] to be evaluated for your leg swelling and your respiratory symptoms as I believe they are coming from your heart not pumping properly.  Please go now.

## 2024-03-12 NOTE — ED Provider Notes (Signed)
 [facility]    CSN: [id number] Arrival date & time: 03/12/24  1316      History   Chief Complaint Chief Complaint  Patient presents with   Cough    HPI Jahari Billy is a 83 y.o. male.   HPI  83 year old male with past medical history significant for atrial fibrillation, CAD, history of CVA, hyperlipidemia, and hypertension presents for evaluation of 2 weeks worth of cough and chest tightness.  He reports that his cough is productive for clear sputum.  He had shortness of breath at the onset of symptoms but states that it feels better now.  He denies any fever, wheezing, or URI symptoms.  Past Medical History:  Diagnosis Date   Atrial fibrillation (HCC)    Coronary arteriosclerosis due to lipid rich plaque    s/p 3vessel CABG at [facility] 2011   Head injury 05/29/2018   History of stroke 03/18/2017   Hyperlipidemia    Hypertension    Left BB block NEC    Lightheadedness 06/18/2017   Obesity (BMI 30-39.9) 11/19/2013   Pacemaker    Proteinuria 06/18/2017   Thrombocytopenia    Trochanteric bursitis of left hip 08/20/2017    Patient Active Problem List   Diagnosis Date Noted   Bilateral hearing loss 11/04/2023   Rosacea 11/04/2023   Stage 3a chronic kidney disease (HCC) 11/04/2023   Bilateral lower extremity edema 03/05/2023   Cardiac pacemaker in situ 02/08/2022   S/P angioplasty with stent 02/08/2022   S/P CABG x 2 02/08/2022   Prediabetes 02/08/2022   Thrombocytopenia 09/20/2019   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   CHB (complete heart block) (HCC) 07/09/2019   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   Seizure disorder (HCC) 04/26/2017   Mood disorder 03/18/2017   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 Authorizing Provider  allopurinol  (ZYLOPRIM ) 100 MG tablet Take 0.5 tablets (50 mg total) by mouth daily. Patient taking differently: Take 50 mg by mouth daily as needed. 11/04/23   Bair, Kalpana, MD  apixaban  (ELIQUIS ) 5 MG TABS tablet Take 1 tablet (5 mg total) by mouth 2 (two) times daily. 11/04/23   Bair, Kalpana, MD  FLUoxetine  (PROZAC ) 40 MG capsule TAKE 1 CAPSULE(40 MG) BY MOUTH DAILY 11/04/23   Bair, Kalpana, MD  gabapentin  (NEURONTIN ) 100 MG capsule TAKE 2 CAPSULES(200 MG) BY MOUTH TWICE DAILY 11/04/23   Bair, Kalpana, MD  hydrochlorothiazide  (MICROZIDE ) 12.5 MG capsule Take 1 capsule (12.5 mg total) by mouth daily. 11/04/23   Bair, Kalpana, MD  isosorbide  mononitrate (IMDUR ) 30 MG 24 hr tablet Take 1 tablet (30 mg total) by mouth daily. 11/04/23   Bair, Kalpana, MD  levETIRAcetam  (KEPPRA  XR) 500 MG 24 hr tablet Take 1,000-1,500 mg by mouth 2 (two) times daily. Take 2 tablets by mouth in the am and 3 tablets by mouth in the pm    [provider]  loratadine (CLARITIN REDITABS) 10 MG dissolvable tablet Take 10  mg by mouth daily as needed for allergies.    [provider]  metoprolol  succinate (TOPROL -XL) 25 MG 24 hr tablet TAKE 1 TABLET(25 MG) BY MOUTH DAILY 11/04/23   Bair, Kalpana, MD  metroNIDAZOLE  (METROGEL ) 1 % gel Apply topically daily. 11/04/23   Bair, Kalpana, MD  rosuvastatin  (CRESTOR ) 40 MG tablet Take 1 tablet (40 mg total) by mouth daily. 11/04/23   Abbey Bruckner, MD    Family History Family History  Problem Relation Age of Onset   Heart disease Father    Heart attack Father    Aneurysm Brother        heart    Social History Social History   Tobacco Use    Smoking status: Former    Current packs/day: 0.00    Types: Cigarettes    Start date: 10/01/1951    Quit date: 10/01/1959    Years since quitting: 64.4   Smokeless tobacco: Never  Vaping Use   Vaping status: Never Used  Substance Use Topics   Alcohol  use: Yes    Alcohol /week: 1.0 standard drink of alcohol     Types: 1 Glasses of wine per week    Comment: occasional glass of wine   Drug use: No     Allergies   Bee pollen, Dust mite extract, Other, Pollen extract, Amoxicillin -pot clavulanate, and Atorvastatin calcium    Review of Systems Review of Systems  Constitutional:  Negative for fever.  HENT:  Negative for congestion, ear pain and rhinorrhea.   Respiratory:  Positive for cough and shortness of breath. Negative for wheezing.      Physical Exam Triage Vital Signs ED Triage Vitals  Encounter Vitals Group     BP      Girls Systolic BP Percentile      Girls Diastolic BP Percentile      Boys Systolic BP Percentile      Boys Diastolic BP Percentile      Pulse      Resp      Temp      Temp src      SpO2      Weight      Height      Head Circumference      Peak Flow      Pain Score      Pain Loc      Pain Education      Exclude from Growth Chart    No data found.  Updated Vital Signs BP 125/71 (BP Location: Right Arm)   Pulse 73   Temp (!) 97.5 F (36.4 C) (Oral)   Resp 16   Ht 6' 1 (1.854 m)   Wt 218 lb 0.6 oz (98.9 kg)   SpO2 95%   BMI 28.77 kg/m   Visual Acuity Right Eye Distance:   Left Eye Distance:   Bilateral Distance:    Right Eye Near:   Left Eye Near:    Bilateral Near:     Physical Exam Vitals and nursing note reviewed.  Constitutional:      Appearance: Normal appearance. He is not ill-appearing.  HENT:     Head: Normocephalic and atraumatic.  Cardiovascular:     Rate and Rhythm: Normal rate.     Pulses: Normal pulses.     Heart sounds: Normal heart sounds. No murmur heard.    No friction rub. No gallop.  Pulmonary:      Effort: Pulmonary effort is normal.     Breath sounds: Normal breath sounds. No wheezing,  rhonchi or rales.  Skin:    General: Skin is warm and dry.     Capillary Refill: Capillary refill takes less than 2 seconds.     Findings: No rash.  Neurological:     General: No focal deficit present.     Mental Status: He is alert and oriented to person, place, and time.      UC Treatments / Results  Labs (all labs ordered are listed, but only abnormal results are displayed) Labs Reviewed - No data to display  EKG   Radiology DG Chest 2 View Result Date: 03/12/2024 EXAM: 2 VIEW(S) XRAY OF THE CHEST 03/12/2024 02:05:00 PM COMPARISON: None available. CLINICAL HISTORY: Cough x 2 weeks FINDINGS: LINES, TUBES AND DEVICES: Left chest pacemaker with leads terminating in right atrium and right ventricle. LUNGS AND PLEURA: Upper pole pulmonary vascular prominence suggesting pulmonary venous hypertension without overt edema. No focal pulmonary opacity. No pleural effusion. No pneumothorax. HEART AND MEDIASTINUM: Mild cardiomegaly. Aortic atherosclerosis. CABG markers noted. Left chest pacemaker with leads terminating in right atrium and right ventricle. BONES AND SOFT TISSUES: Thoracic spondylosis. IMPRESSION: 1. Upper lobe pulmonary vascular prominence suggesting pulmonary venous hypertension without overt edema. 2. Mild cardiomegaly. 3. Aortic atherosclerosis. 4. Status post CABG with median sternotomy wires and clips. 5. Left chest pacemaker with leads terminating in the right atrium and right ventricle. 6. Thoracic spondylosis. Electronically signed by: Juan Olthoff Salvage MD 03/12/2024 02:30 PM EST RP Workstation: [id number]    Procedures Procedures (including critical care time)  Medications Ordered in UC Medications - No data to display  Initial Impression / Assessment and Plan / UC Course  I have reviewed the triage vital signs and the nursing notes.  Pertinent labs & imaging results that were  available during my care of the patient were reviewed by me and considered in my medical decision making (see chart for details).   Patient is a nontoxic-appearing 83 year old male presenting for evaluation 2 weeks worth of cough and chest tightness.  His sputum is clear.  He did have shortness of breath at onset but reports that that has resolved.  He is able to speak in full sentence without dyspnea or tachypnea.  His lungs are clear to auscultation all fields.  He does have a very moist, raspy cough and raspy voice.  I will obtain a radiograph of the chest today for any acute cardiopulmonary pathology.  Chest x-ray independently reviewed and evaluated by me.  Impression: Patchy haziness in bilateral bases as well as in the right infrahilar region.  Cardiomediastinal silhouette at the upper limits of normal.  Dual-chamber pacemaker present in the left anterior chest.  Sternotomy wires present.  Radiology overread is pending. Radiology impression states upper lobe pulmonary vascular prominence suggesting pulmonary venous hypertension without overt edema.  Mild cardiomegaly.  Aortic atherosclerosis.  Status post CABG with median sternotomy wires and clips.  Left chest pacemaker with leads terminating in the right atrium and right ventricle.  Thoracic spondylosis.  Patient does have a cardiologist since he is due [facility].  Last clinic note dated 12/09/2023.  I talked to the patient about his chest x-ray findings I am concerned that his pulmonary symptoms are actually a result of his heart not working properly.  His son indicated that he has been experiencing swelling on his legs for the last week and a half.  When I checked he had 2+ pitting edema from mid shins down.  The patient will go to [facility] for evaluation.  I will have his chest x-ray [facility] with [facility].   Final Clinical Impressions(s) / UC Diagnoses   Final diagnoses:  Subacute cough  Pulmonary vascular congestion      Discharge Instructions      Please go to the emergency department at [facility] to be evaluated for your leg swelling and your respiratory symptoms as I believe they are coming from your heart not pumping properly.  Please go now.     ED Prescriptions   None    PDMP not reviewed this encounter.   Bernardino Ditch, NP 03/12/24 [phone]

## 2024-03-14 NOTE — Discharge Summary (Signed)
 [facility]                          Heart Failure Discharge Summary   Admit Date: 03/12/2024  Discharge Date: 03/18/2024  Admitting Physician: Toni Inetta Glade, MD  Discharge Physician: Mee Morene Barrio, MD  Primary Care Provider: Bair, Kalpana G, MD  Primary Cardiologist: Lavella Evalene Dover, MD    Discharge Destination: Home  Discharge Services: none  Code Status: Full Code   Admission Diagnoses:  Cardiac pacemaker in situ [Z95.0] Chest pain, unspecified type [R07.9]  Discharge Diagnoses:  Principal Problem:   Acute systolic CHF (congestive heart failure) (CMS/HHS-HCC) Active Problems:   CAD (coronary artery disease)   Cardiac pacemaker in situ   S/P CABG x 2   Longstanding persistent atrial fibrillation (CMS/HHS-HCC)   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 *     Anticipatory Guidance (key med changes, results pending, future labs, IV therapies):   Presented with 3 weeks HF symptoms iso RV pacing >99%. 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 S/p CRT-P upgrade on 12/3 - EDW: 95.2 kg     Cardiac Rehab: not indicated due to patient declined  Patient Discharge Instructions:   Do not lift anything greater than 10 lbs  Order Comments: For next 15 days.   If you smoke (or have smoked within the last year), we strongly recommend that you do not smoke.   Weigh yourself daily and record   Low cholesterol, low fat   Notify cardiology provider of chest pain   Notify provider of swelling in arms, legs, or stomach   Notify provider temperature greater than 101.0 F (38.3 C) degrees   Notify provider of weight gain greater than 2 lbs in 1 day or 5 lbs in 1 week   Report questions or  concerns to the [facility] Center at [phone]   Notify primary care physician of other symptoms   For a life-threatening emergency, call 911   Follow-up with Primary Care Provider   Follow-up with Cardiology   Notify provider of nausea or vomiting   Notify provider of difficulty breathing or shortness of breath   Normal activity     [facility] Provider Follow-up: Future Appointments  Date Time Provider Department Center  03/24/2024  8:20 AM Francisca Garre, [place] [facility]  04/01/2024  9:20 AM THA NURSE [facility]  04/01/2024  9:30 AM THA DEVICE [facility]  05/18/2024  2:00 PM Elizabeth Champ Reusing, PA [facility]  08/30/2024 10:30 AM THA ECHO 3 [facility]  09/03/2024 11:30 AM Lavella Evalene Dover, MD [facility]  09/03/2024 11:30 AM THA DEVICE [facility]  02/28/2025 11:00 AM Margette Donnice Fallow, MD [facility]    Non-[facility] Provider Follow-up: none  Report Issues: By using [facility], or by calling the [facility] at [phone].  For urgent issues or after business hours (after 5pm on weekdays and anytime on weekends), call the [facility] Operator [phone] and ask to page the on-call cardiologist.     Allergies/Intolerances:  Allergies  Allergen Reactions  . Amoxicillin -Pot Clavulanate Nausea And Vomiting and Other (See Comments)  Other reaction(s): Other (See Comments) Abdominal pain Abdominal pain  . Atorvastatin Calcium  Rash     PROBABLY NOT DRUG EFFECT  . Mite Extract Unknown  . Other Other (See Comments)  . Pollen Extracts Other (See Comments)  . Pollens Extract Other (See Comments)     Medications:     Discharge Medications     New Medications      Details  empagliflozin 10 mg tablet Commonly known as: JARDIANCE Start taking on: March 19, 2024  10 mg, Oral, Daily Quantity: 90 tablet Refills: 3   * FUROsemide 20 MG tablet Commonly known as:  LASIX  40 mg, Oral, Daily Quantity: 6 tablet Refills: 0   * FUROsemide 20 MG tablet Commonly known as: LASIX Start taking on: March 20, 2024  20 mg, Oral, Daily Quantity: 90 tablet Refills: 3   oxyCODONE-acetaminophen  5-325 mg tablet Commonly known as: PERCOCET  1-2 tablets, Oral, Every 6 hours PRN Quantity: 12 tablet Refills: 0   valsartan 40 MG tablet Commonly known as: DIOVAN Start taking on: March 19, 2024  40 mg, Oral, Daily Quantity: 90 tablet Refills: 3      * * There are duplicate medications prescribed to the patient          Modified Medications      Details  apixaban  2.5 mg tablet Commonly known as: ELIQUIS  Start taking on: March 19, 2024 What changed:  medication strength how much to take when to take this  2.5 mg, Oral, Every 12 hours Quantity: 180 tablet Refills: 3       Medications To Continue      Details  allopurinoL  100 MG tablet Commonly known as: ZYLOPRIM   100 mg, Daily Refills: 0   FLUoxetine  20 MG capsule Commonly known as: PROzac   20 mg, Daily Refills: 0   gabapentin  100 MG capsule Commonly known as: NEURONTIN   200 mg, Oral, 2 times Daily Quantity: 360 capsule Refills: 3   levETIRAcetam  500 mg XR tablet Commonly known as: KEPPRA  XR  TAKE 2 TABLETS BY MOUTH IN AM AND 3 TABLETS BY MOUTH IN THE PM Quantity: 450 tablet Refills: 3   metoprolol  SUCCinate 25 MG XL tablet Commonly known as: TOPROL -XL  25 mg, Oral, Daily Quantity: 90 tablet Refills: 3   rosuvastatin  40 MG tablet Commonly known as: CRESTOR   40 mg, Oral, Nightly Quantity: 90 tablet Refills: 3       Stopped Medications    hydroCHLOROthiazide  12.5 MG tablet Commonly known as: HYDRODIURIL    HYDROcodone -acetaminophen  5-325 mg tablet Commonly known as: NORCO   isosorbide  mononitrate 30 MG ER tablet Commonly known as: IMDUR         ARNI/ACE/ARB: Prescribed SGLT2i: Prescribed Beta Blocker: Prescribed MRA: to be started as  outpt Hydralazine /Nitrates: Not indicated   Brief History of Present Illness: Per the H&P dated on 03/12/2024:   Blake Osborne is a 83 y.o.male who has a history of coronary artery disease, angioplasty and three-vessel bypass in 2011.  Medtronic dual-chamber pacemaker was placed in April 2011 for treatment of 2:1 heart block and bradycardia.   In 2017, he was diagnosed with paroxysmal atrial fibrillation based on pacemaker checks and was started on Xarelto although he did not take it regularly.  In August 2018 he had a CVA.  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  He has actually done quite well. He was eventually cleared by neurology for anticoagulation and is now taking Eliquis  twice daily. He denies any bleeding difficulty or falls.  Atrial fibrillation has become persistent, and he has some occasional dizziness when he first stands up.  This is a chronic complaint. No falls. He has required 99% ventricular pacing but echocardiogram in April 2021 showed normal EF, see below. He underwent pacemaker generator change in October 2021.  Follow-up of echocardiogram in 2025 showed LVEF has fallen to about 45%.     He has had no heart failure symptoms until approximately 3 weeks ago.  He started noticing lower extremity edema and then more recently abdominal fullness and now congestion.  Although he does not describe true orthopnea or paroxysmal nocturnal dyspnea states that he sounds very congested at nighttime per his wife.     Today he presented with worsening abdominal fullness and dyspnea as well as a persistent cough.  His lower extremity edema has worsened.  He said no syncope or presyncope.  He presents today along with his son.   His primary cardiologist is Dr. Hershal and EP Dr. Lavella.  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  _____________________  Hospital Course by Problem:   # Acute systolic heart failure # HTN # RV pacing Echo 06/2023 with drop  in EF to 40-45% (down from 50% in 2021). Saw Dr Lavella (EP) in May to discuss CRT, but was not experiencing HF symptoms at that time, so deferred device upgrade. Now presents with 3 weeks of HF symptoms in setting of RV pacing. IV diuresed until euvolemic. Echo 12/1 with EF 45%, and mild TR. EP consulted and underwent CRT-P upgrade 12/3. Started daily PO lasix. Stopped HCTZ in favor of HF GDMT. Started valsartan, empagliflozin. Plan to start spiro as outpt. Continued home metoprolol . Stopped Imdur . Weight 95.2 on discharge.    # Atrial fibrillation # 2:1 heart block s/p DC PPM (MDT) Chronic AF. Reduced apixiban with cr >1.5 and age >15, to start Eliquis  12/5 to allow 48 after PPM upgrade. Monitor renal function; may need to increase back to 5 mg BID.    # CKD3 Creatinine baseline 1.3-1.5. Cr peaked at 1.8 in sitting of robust diuresis. Improved to 1.8 on day of discharge.    # CAD s/p CABG 2011 No CP. Continued statin. LDL 36. Stopped Imdur     # Hx CVA with hemorrhagic conversion Continued statin   # Hx seizures Continued home keppra       Social Drivers of Health with Concerns   Tobacco Use: Medium Risk (03/12/2024)   Received from [facility]   Patient History   . Smoking Tobacco Use: Former   . Smokeless Tobacco Use: Never  Transportation Needs: Unknown (03/14/2024)   PRAPARE - Transportation   . Lack of Transportation (Medical): No  Physical Activity: Inactive (11/11/2023)   Received from [facility]   Exercise Vital Sign   . On average, how many days per week do you engage in moderate to strenuous exercise (like a brisk walk)?: 0 days   . On average, how many minutes do you engage in exercise at this level?: 0 min  Social Connections: Moderately Isolated (11/11/2023)   Received from [facility]   Social Connection and Isolation Panel   . In a typical week, how many times do you talk on the phone with family, friends, or neighbors?: More than three times a week   . How often  do you get together with friends or relatives?: More than three times a week   . How often do you attend church or religious services?: Never   . Do  you belong to any clubs or organizations such as church groups, unions, fraternal or athletic groups, or school groups?: No   . How often do you attend meetings of the clubs or organizations you belong to?: Never   . Are you married, widowed, divorced, separated, never married, or living with a partner?: Married  Depression: At risk (09/26/2017)   PHQ-2   . PHQ-2 Score: 9  Health Literacy: Inadequate Health Literacy (11/11/2023)   Received from [facility]   B1300 Health Literacy   . How often do you need to have someone help you when you read instructions, pamphlets, or other written material from your doctor or pharmacy?: Often   Comorbid Conditions: Hematologic Disorders:    Thrombocytopenia:  Thrombocytopenia present with lowest platelet count of 135.  Will continue to monitor and assess for bleeding complications.           Imaging and Procedures Performed:    CXR 11/28 1. Mediastinal and cardiac contours are enlarged and unchanged, status post median sternotomy. 2. Bilateral opacities, likely edema with basilar atelectasis.  3. Trace bilateral effusions. No significant pneumothorax. 4. Degenerative changes of the visualized spine. 5. LEFT chest wall dual-lead pacer present.  Echo 12/1 CONCLUSION ------------------------------------------------------------------------------- MILD LEFT VENTRICULAR SYSTOLIC DYSFUNCTION WITH MILD LVH ESTIMATED EF: 45%, CALC EF(2D): 49%, CALC EF(3D): 51% INDETERMINATE DIASTOLIC FUNCTION NORMAL RIGHT VENTRICULAR SYSTOLIC FUNCTION VALVULAR REGURGITATION: No AR, TRIVIAL MR, TRIVIAL PR, MILD TR                              ESTIMATED RVSP: 38 mmHg (Normal) NO VALVULAR STENOSIS             NM Tumor SPECT 12/2 No scintigraphic evidence of ATTR cardiac  amyloidosis.   _____________________  Discharge Exam:  Admission Weight: 99.8 kg (220 lb)  Discharge Weight: Weight: 95.2 kg (209 lb 14.1 oz) BMI: Body mass index is 27.7 kg/m. BP 113/63 (BP Location: Right upper arm, Patient Position: Lying)   Pulse 72   Temp 36.7 C (98.1 F) (Oral)   Resp 20   Ht 185.4 cm (6' 0.99)   Wt 95.2 kg (209 lb 14.1 oz)   SpO2 93%   BMI 27.70 kg/m   General: alert, cooperative, and in NAD Respiratory: regular rate, symmetric, unlabored, clear to auscultation bilaterally, and no accessory muscle use Cardiac: regular rate, regular rhythm, S1, S2 present, no murmur, no rub, no gallop, and JVD non-elevated Abdomen: normal bowel sounds, soft, nontender, and nondistended Extremities: extremities warm and well perfused, no clubbing or cyanosis, no edema, and distal pulses intact Lines: none  Pertinent Lab Testing:  BMP: Recent Labs  Lab 03/18/24 0448  NA 138  K 4.3  CL 104  CO2 29  BUN 37*  CREATININE 1.8*  GLUCOSE 113  CALCIUM  9.0  MG 2.0   CBC: Recent Labs  Lab 03/18/24 0448  WBC 10.2*  HGB 13.2*  HCT 41.9  PLT 135*   INR: Recent Labs  Lab 03/12/24 1719  INR 1.5*    TFTs: No results for input(s): TSH, T4FREE in the last 168 hours.       Other Pertinent Labs: None  _____________________  Time spent on discharge process:  >30 minutes  Blake [facility], NP    ------------------------------------------------------------------------------- Attestation signed by Mee Morene Barrio, MD at 03/18/2024  4:01 PM Attestation Statement:  I personally saw the patient and performed a substantive portion of the medical decision  making in conjunction with the Advanced Practice Provider as documented above.   Blake Osborne underwent successful CRT-P implant. Wound site appears well healed. We have initiated low level GDMT.  OP FU arranged  35 minutes on DC  Benjamin H. Mee, MD [facility] Heart  Failure [facility] [phone] (M)  ------------------------------------------------------------------------------- *Some images could not be shown.

## 2024-03-19 ENCOUNTER — Telehealth: Payer: Self-pay

## 2024-03-19 NOTE — Transitions of Care (Post Inpatient/ED Visit) (Signed)
   03/19/2024  Name: Blake Osborne MRN: [id number] DOB: [birth year]  Today's TOC FU Call Status: Today's TOC FU Call Status:: Unsuccessful Call (1st Attempt) Unsuccessful Call (1st Attempt) Date: 03/19/24  Attempted to reach the patient regarding the most recent Inpatient/ED visit.  Follow Up Plan: Additional outreach attempts will be made to reach the patient to complete the Transitions of Care (Post Inpatient/ED visit) call.   Arvin Seip RN, BSN, CCM [employer], Population Health Case Manager Phone: [phone]

## 2024-03-19 NOTE — Transitions of Care (Post Inpatient/ED Visit) (Signed)
   03/19/2024  Name: Vermon Grays MRN: [id number] DOB: Feb 07, 1941  Today's TOC FU Call Status: Today's TOC FU Call Status:: Successful TOC FU Call Completed  Patient's Name and Date of Birth confirmed. 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 She states the doctor and pharmacist has called to check on patient.)  Wife states she is unable to complete call at this time and request return call later today or Monday 03/22/24. Unable to complete assessment and care plan. Will attempt to call patient at a later time as requested.   Arvin Seip RN, BSN, CCM [employer], Population Health Case Manager Phone: [phone]

## 2024-03-22 ENCOUNTER — Telehealth: Payer: Self-pay

## 2024-03-22 NOTE — Transitions of Care (Post Inpatient/ED Visit) (Signed)
   03/22/2024  Name: Blake Osborne MRN: [id number] DOB: Feb 28, 1941  Today's TOC FU Call Status: Today's TOC FU Call Status:: Unsuccessful Call (2nd Attempt) Unsuccessful Call (2nd Attempt) Date: 03/22/24  Attempted to reach the patient regarding the most recent Inpatient/ED visit.  Follow Up Plan: Additional outreach attempts will be made to reach the patient to complete the Transitions of Care (Post Inpatient/ED visit) call.   Arvin Seip RN, BSN, CCM [employer], Population Health Case Manager Phone: [phone]

## 2024-03-23 ENCOUNTER — Telehealth: Payer: Self-pay

## 2024-03-23 NOTE — Transitions of Care (Post Inpatient/ED Visit) (Signed)
   03/23/2024  Name: Blake Osborne MRN: [id number] DOB: [birth year]  Today's TOC FU Call Status: Today's TOC FU Call Status:: Unsuccessful Call (3rd Attempt) Unsuccessful Call (3rd Attempt) Date: 03/23/24  Attempted to reach the patient regarding the most recent Inpatient/ED visit.  Follow Up Plan: No further outreach attempts will be made at this time. We have been unable to contact the patient.  Arvin Seip RN, BSN, CCM [employer], Population Health Case Manager Phone: [phone]

## 2024-03-24 DIAGNOSIS — E785 Hyperlipidemia, unspecified: Secondary | ICD-10-CM | POA: Diagnosis not present

## 2024-03-24 DIAGNOSIS — Z950 Presence of cardiac pacemaker: Secondary | ICD-10-CM | POA: Diagnosis not present

## 2024-03-24 DIAGNOSIS — J811 Chronic pulmonary edema: Secondary | ICD-10-CM | POA: Diagnosis not present

## 2024-03-24 DIAGNOSIS — J9811 Atelectasis: Secondary | ICD-10-CM | POA: Diagnosis not present

## 2024-03-24 DIAGNOSIS — I442 Atrioventricular block, complete: Secondary | ICD-10-CM | POA: Diagnosis not present

## 2024-03-24 DIAGNOSIS — I4811 Longstanding persistent atrial fibrillation: Secondary | ICD-10-CM | POA: Diagnosis not present

## 2024-03-24 DIAGNOSIS — I5021 Acute systolic (congestive) heart failure: Secondary | ICD-10-CM | POA: Diagnosis not present

## 2024-03-24 DIAGNOSIS — Z951 Presence of aortocoronary bypass graft: Secondary | ICD-10-CM | POA: Diagnosis not present

## 2024-03-24 DIAGNOSIS — R058 Other specified cough: Secondary | ICD-10-CM | POA: Diagnosis not present

## 2024-03-24 DIAGNOSIS — I2510 Atherosclerotic heart disease of native coronary artery without angina pectoris: Secondary | ICD-10-CM | POA: Diagnosis not present

## 2024-03-24 DIAGNOSIS — I10 Essential (primary) hypertension: Secondary | ICD-10-CM | POA: Diagnosis not present

## 2024-03-24 DIAGNOSIS — R0602 Shortness of breath: Secondary | ICD-10-CM | POA: Diagnosis not present

## 2024-04-01 DIAGNOSIS — Z950 Presence of cardiac pacemaker: Secondary | ICD-10-CM | POA: Diagnosis not present

## 2024-04-21 ENCOUNTER — Encounter: Payer: Self-pay | Admitting: Nurse Practitioner

## 2024-04-21 ENCOUNTER — Ambulatory Visit: Admitting: Nurse Practitioner

## 2024-04-21 VITALS — BP 122/80 | HR 79 | Temp 97.4°F | Ht 73.0 in | Wt 210.0 lb

## 2024-04-21 DIAGNOSIS — R053 Chronic cough: Secondary | ICD-10-CM

## 2024-04-21 DIAGNOSIS — I509 Heart failure, unspecified: Secondary | ICD-10-CM | POA: Diagnosis not present

## 2024-04-21 NOTE — Progress Notes (Signed)
 "  BP 122/80   Pulse 79   Temp (!) 97.4 F (36.3 C)   Ht 6' 1 (1.854 m)   Wt 210 lb (95.3 kg)   SpO2 92%   BMI 27.71 kg/m    Subjective:    Patient ID: Blake Osborne, male    DOB: Dec 15, 1940, 84 y.o.   MRN: [id number]  HPI: Blake Osborne is a 84 y.o. male  Chief Complaint  Patient presents with   Cough    Pt c/o cough x2 months.    Discussed the use of AI scribe software for clinical note transcription with the patient, who gave verbal consent to proceed.  History of Present Illness Blake Osborne is an 84 year old male with heart failure who presents with a persistent cough. He is accompanied by his brother.  Cough and chest congestion - Persistent cough for approximately two months - Cough is worse at night when lying down and improves with walking - No fever or nasal congestion - Sensation of chest congestion - Occasional expectoration of mucus from the upper chest region  Lower extremity edema - Swelling in the feet - Improvement in swelling after starting Lasix 20 mg, administered byfamily - Currently takes one pill of Lasix in the morning  Cardiac symptoms and findings - History of heart failure - Heart rate is higher than baseline - Chest x-ray on March 04, 2024: upper lobe pulmonary vascular prominence, mild cardiomegaly, aortic atherosclerosis - Chest x-ray on March 24, 2024: no pneumothorax or pleural effusion, minimal basal atelectasis, decreased mild pulmonary edema - Seen by cardiology on April 01, 2024  Medication use and effects - Recently started Jardiance; cough began prior to initiation - No side effects from [place], including yeast infections - Initially not taking Lasix, but now taking regularly with improvement in symptoms  Recent healthcare encounters and laboratory findings - Multiple urgent care visits for cough, including March 24, 2024 - Basic metabolic panel on March 24, 2024: 1387      XR CHEST PA AND LATERAL   INDICATION: I50.21 Acute systolic (congestive) heart failure (CMS/HHS-HCC),  R06.02 Shortness of breath, R05.8 Other specified cough   COMPARISON: Most recent prior   FINDINGS/IMPRESSION:   Stable alignment of the median sternotomy wires with discontiguous wire,  third from the top. Stable biventricular cardiac pacemaker.   Stable cardiac silhouette.   No pneumothorax or pleural effusion. Minimal basal atelectasis otherwise no  new parenchymal. Previously seen mild pulmonary edema has decreased..   Electronically Signed by:  Jayson Sprinkle, MD, [facility]  Electronically Signed on:  03/24/2024 8:59 AM      11/11/2023   11:48 AM 11/04/2023   10:14 AM 08/09/2022    9:59 AM  Depression screen PHQ 2/9  Decreased Interest 0 0 0  Down, Depressed, Hopeless 0 0 0  PHQ - 2 Score 0 0 0  Altered sleeping 2 2   Tired, decreased energy 2 2   Change in appetite 0 0   Feeling bad or failure about yourself  0 0   Trouble concentrating 0 0   Moving slowly or fidgety/restless 0 0   Suicidal thoughts 0 0   PHQ-9 Score 4  4    Difficult doing work/chores Somewhat difficult Somewhat difficult      Data saved with a previous flowsheet row definition    Relevant past medical, surgical, family and social history reviewed and updated as indicated. Interim medical history since our last visit reviewed. Allergies and  medications reviewed and updated.  Review of Systems  Ten systems reviewed and is negative except as mentioned in HPI      Objective:      BP 122/80   Pulse 79   Temp (!) 97.4 F (36.3 C)   Ht 6' 1 (1.854 m)   Wt 210 lb (95.3 kg)   SpO2 92%   BMI 27.71 kg/m    Wt Readings from Last 3 Encounters:  04/21/24 210 lb (95.3 kg)  03/12/24 218 lb 0.6 oz (98.9 kg)  11/11/23 218 lb (98.9 kg)    Physical Exam VITALS: SaO2- 92% GENERAL: Alert, cooperative, well developed, no acute distress. HEENT: Normocephalic, normal oropharynx, moist  mucous membranes, ears normal. CHEST: Clear to auscultation bilaterally, no wheezes, rhonchi, or crackles. CARDIOVASCULAR: Normal heart rate and rhythm, S1 and S2 normal without murmurs. ABDOMEN: Soft, non-tender, non-distended, without organomegaly, normal bowel sounds. EXTREMITIES: No cyanosis or edema. NEUROLOGICAL: Cranial nerves grossly intact, moves all extremities without gross motor or sensory deficit.  Results for orders placed or performed in visit on 11/04/23  Comp Met (CMET)   Collection Time: 11/04/23 11:01 AM  Result Value Ref Range   Sodium 140 135 - 145 mEq/L   Potassium 4.2 3.5 - 5.1 mEq/L   Chloride 105 96 - 112 mEq/L   CO2 28 19 - 32 mEq/L   Glucose, Bld 99 70 - 99 mg/dL   BUN 27 (H) 6 - 23 mg/dL   Creatinine, Ser 8.57 0.40 - 1.50 mg/dL   Total Bilirubin 0.7 0.2 - 1.2 mg/dL   Alkaline Phosphatase 71 39 - 117 U/L   AST 43 (H) 0 - 37 U/L   ALT 27 0 - 53 U/L   Total Protein 6.5 6.0 - 8.3 g/dL   Albumin 4.2 3.5 - 5.2 g/dL   GFR 54.39 (L) >39.99 mL/min   Calcium  9.1 8.4 - 10.5 mg/dL  Lipid panel   Collection Time: 11/04/23 11:01 AM  Result Value Ref Range   Cholesterol 107 0 - 200 mg/dL   Triglycerides 33.9 0.0 - 149.0 mg/dL   HDL 50.69 >60.99 mg/dL   VLDL 86.7 0.0 - 59.9 mg/dL   LDL Cholesterol 44 0 - 99 mg/dL   Total CHOL/HDL Ratio 2    NonHDL 57.58   CBC   Collection Time: 11/04/23 11:01 AM  Result Value Ref Range   WBC 7.6 4.0 - 10.5 K/uL   RBC 4.31 4.22 - 5.81 Mil/uL   Platelets 106.0 (L) 150.0 - 400.0 K/uL   Hemoglobin 13.3 13.0 - 17.0 g/dL   HCT 58.8 60.9 - 47.9 %   MCV 95.4 78.0 - 100.0 fl   MCHC 32.4 30.0 - 36.0 g/dL   RDW 86.6 88.4 - 84.4 %  HgB A1c   Collection Time: 11/04/23 11:01 AM  Result Value Ref Range   Hgb A1c MFr Bld 6.4 4.6 - 6.5 %          Assessment & Plan:   Problem List Items Addressed This Visit       Cardiovascular and Mediastinum   Chronic congestive heart failure (HCC)   Relevant Medications   furosemide  (LASIX) 20 MG tablet   Other Visit Diagnoses       Chronic cough    -  Primary   Relevant Orders   Ambulatory referral to Pulmonology        Assessment and Plan Assessment & Plan Chronic cough Persisting for over two months, primarily nocturnal, with no fever or  nasal congestion. Lungs are clear, indicating no pulmonary edema or infection. Differential diagnosis includes mucus accumulation, possibly due to a viral infection or other non-cardiac causes. Jardiance is unlikely to be the cause. Lisinopril is not being taken, ruling it out as a cause. Mucus accumulation is suspected. - Start Mucinex (guaifenesin) twice daily to help clear mucus. - Avoid dairy products to prevent mucus thickening. - If cough persists after one week, will refer to pulmonology for a pulmonary function test.  Heart failure Recent exacerbation with leg swelling and weight gain, improved with increased Lasix. Lungs are clear, indicating no current pulmonary edema. Heart rate is well-controlled with a new pacemaker. No current signs of heart failure exacerbation. - Continue Lasix as prescribed to manage fluid retention. - Follow up with cardiologist as scheduled.        Follow up plan: Return if symptoms worsen or fail to improve. "

## 2024-05-06 ENCOUNTER — Ambulatory Visit

## 2024-05-11 ENCOUNTER — Ambulatory Visit: Admitting: Pulmonary Disease

## 2024-11-15 ENCOUNTER — Ambulatory Visit
